# Patient Record
Sex: Male | Born: 1970 | Race: Black or African American | Hispanic: No | Marital: Married
Health system: Southern US, Academic
[De-identification: ages and names within clinical notes are randomized; demographics above are authoritative.]

## PROBLEM LIST (undated history)

## (undated) ENCOUNTER — Encounter

## (undated) ENCOUNTER — Encounter: Attending: Internal Medicine | Primary: Internal Medicine

## (undated) ENCOUNTER — Encounter: Attending: Hematology & Oncology | Primary: Hematology & Oncology

## (undated) ENCOUNTER — Ambulatory Visit: Payer: MEDICARE | Attending: Nurse Practitioner | Primary: Nurse Practitioner

## (undated) ENCOUNTER — Ambulatory Visit

## (undated) ENCOUNTER — Telehealth: Attending: Pharmacist | Primary: Pharmacist

## (undated) ENCOUNTER — Telehealth

## (undated) ENCOUNTER — Encounter: Attending: Nurse Practitioner | Primary: Nurse Practitioner

## (undated) ENCOUNTER — Encounter: Attending: Pharmacist | Primary: Pharmacist

## (undated) ENCOUNTER — Ambulatory Visit: Payer: MEDICARE

## (undated) ENCOUNTER — Telehealth
Attending: Student in an Organized Health Care Education/Training Program | Primary: Student in an Organized Health Care Education/Training Program

## (undated) ENCOUNTER — Ambulatory Visit: Payer: MEDICARE | Attending: Pediatric Cardiology | Primary: Pediatric Cardiology

## (undated) ENCOUNTER — Telehealth: Attending: Hematology & Oncology | Primary: Hematology & Oncology

## (undated) ENCOUNTER — Telehealth: Attending: Nurse Practitioner | Primary: Nurse Practitioner

## (undated) ENCOUNTER — Ambulatory Visit: Payer: MEDICARE | Attending: Clinical Cardiac Electrophysiology | Primary: Clinical Cardiac Electrophysiology

## (undated) ENCOUNTER — Encounter: Attending: Cardiovascular Disease | Primary: Cardiovascular Disease

## (undated) ENCOUNTER — Encounter
Attending: Student in an Organized Health Care Education/Training Program | Primary: Student in an Organized Health Care Education/Training Program

## (undated) ENCOUNTER — Telehealth: Attending: Internal Medicine | Primary: Internal Medicine

## (undated) ENCOUNTER — Encounter: Payer: MEDICARE | Attending: Adult Health | Primary: Adult Health

## (undated) ENCOUNTER — Telehealth: Attending: Adult Health | Primary: Adult Health

## (undated) ENCOUNTER — Encounter: Attending: Adult Health | Primary: Adult Health

## (undated) ENCOUNTER — Ambulatory Visit
Payer: MEDICARE | Attending: Student in an Organized Health Care Education/Training Program | Primary: Student in an Organized Health Care Education/Training Program

## (undated) ENCOUNTER — Encounter: Attending: Clinical Cardiac Electrophysiology | Primary: Clinical Cardiac Electrophysiology

## (undated) ENCOUNTER — Ambulatory Visit: Payer: MEDICARE | Attending: Adult Health | Primary: Adult Health

## (undated) ENCOUNTER — Ambulatory Visit
Payer: Medicare (Managed Care) | Attending: Student in an Organized Health Care Education/Training Program | Primary: Student in an Organized Health Care Education/Training Program

## (undated) ENCOUNTER — Inpatient Hospital Stay

## (undated) ENCOUNTER — Encounter: Payer: MEDICARE | Attending: Clinical Cardiac Electrophysiology | Primary: Clinical Cardiac Electrophysiology

## (undated) ENCOUNTER — Ambulatory Visit: Payer: MEDICARE | Attending: Hematology & Oncology | Primary: Hematology & Oncology

## (undated) ENCOUNTER — Ambulatory Visit: Payer: Medicare (Managed Care)

---

## 2019-07-29 DIAGNOSIS — C9210 Chronic myeloid leukemia, BCR/ABL-positive, not having achieved remission: Principal | ICD-10-CM

## 2019-08-11 DIAGNOSIS — C9210 Chronic myeloid leukemia, BCR/ABL-positive, not having achieved remission: Principal | ICD-10-CM

## 2019-08-12 ENCOUNTER — Encounter: Admit: 2019-08-12 | Discharge: 2019-08-12 | Payer: MEDICARE | Attending: Internal Medicine | Primary: Internal Medicine

## 2019-08-12 ENCOUNTER — Other Ambulatory Visit: Admit: 2019-08-12 | Discharge: 2019-08-12 | Payer: MEDICARE

## 2019-08-12 DIAGNOSIS — Q221 Congenital pulmonary valve stenosis: Principal | ICD-10-CM

## 2019-08-12 DIAGNOSIS — E1165 Type 2 diabetes mellitus with hyperglycemia: Secondary | ICD-10-CM

## 2019-08-12 DIAGNOSIS — C9210 Chronic myeloid leukemia, BCR/ABL-positive, not having achieved remission: Principal | ICD-10-CM

## 2019-08-12 DIAGNOSIS — Z794 Long term (current) use of insulin: Principal | ICD-10-CM

## 2019-08-12 MED ORDER — HYDROXYUREA 500 MG CAPSULE
ORAL_CAPSULE | Freq: Two times a day (BID) | ORAL | 0 refills | 30.00000 days | Status: CP
Start: 2019-08-12 — End: 2019-09-11

## 2019-08-19 DIAGNOSIS — C9210 Chronic myeloid leukemia, BCR/ABL-positive, not having achieved remission: Principal | ICD-10-CM

## 2019-08-20 ENCOUNTER — Encounter: Admit: 2019-08-20 | Discharge: 2019-08-21 | Payer: MEDICARE

## 2019-08-20 ENCOUNTER — Ambulatory Visit: Admit: 2019-08-20 | Discharge: 2019-08-21 | Payer: MEDICARE

## 2019-08-20 DIAGNOSIS — C9210 Chronic myeloid leukemia, BCR/ABL-positive, not having achieved remission: Principal | ICD-10-CM

## 2019-08-26 ENCOUNTER — Encounter: Admit: 2019-08-26 | Discharge: 2019-08-26 | Disposition: A | Discharge status: Home / Self Care | Payer: MEDICARE

## 2019-08-26 DIAGNOSIS — C9210 Chronic myeloid leukemia, BCR/ABL-positive, not having achieved remission: Principal | ICD-10-CM

## 2019-08-26 DIAGNOSIS — Z794 Long term (current) use of insulin: Secondary | ICD-10-CM

## 2019-08-26 DIAGNOSIS — R55 Syncope and collapse: Principal | ICD-10-CM

## 2019-08-26 DIAGNOSIS — E119 Type 2 diabetes mellitus without complications: Secondary | ICD-10-CM

## 2019-08-26 DIAGNOSIS — N529 Male erectile dysfunction, unspecified: Principal | ICD-10-CM

## 2019-08-26 MED ORDER — BOSUTINIB 100 MG TABLET
ORAL_TABLET | Freq: Every day | ORAL | 6 refills | 30 days | Status: CP
Start: 2019-08-26 — End: 2019-09-25

## 2019-08-26 MED ORDER — HYDROXYUREA 500 MG CAPSULE
ORAL_CAPSULE | Freq: Two times a day (BID) | ORAL | 0 refills | 30.00000 days | Status: CP
Start: 2019-08-26 — End: 2019-09-25

## 2019-08-30 DIAGNOSIS — C9210 Chronic myeloid leukemia, BCR/ABL-positive, not having achieved remission: Principal | ICD-10-CM

## 2019-08-30 MED ORDER — BOSUTINIB 100 MG TABLET
ORAL_TABLET | Freq: Every day | ORAL | 6 refills | 30 days | Status: CP
Start: 2019-08-30 — End: 2019-09-29

## 2019-09-05 DIAGNOSIS — C9210 Chronic myeloid leukemia, BCR/ABL-positive, not having achieved remission: Principal | ICD-10-CM

## 2019-09-06 DIAGNOSIS — C9210 Chronic myeloid leukemia, BCR/ABL-positive, not having achieved remission: Principal | ICD-10-CM

## 2019-09-11 ENCOUNTER — Ambulatory Visit: Admit: 2019-09-11 | Payer: MEDICARE | Attending: Pharmacist | Primary: Pharmacist

## 2019-09-11 DIAGNOSIS — Q221 Congenital pulmonary valve stenosis: Principal | ICD-10-CM

## 2019-09-11 DIAGNOSIS — Q211 Atrial septal defect: Principal | ICD-10-CM

## 2019-09-16 ENCOUNTER — Encounter: Admit: 2019-09-16 | Discharge: 2019-09-16 | Payer: MEDICARE

## 2019-09-16 ENCOUNTER — Encounter: Admit: 2019-09-16 | Discharge: 2019-09-16 | Payer: MEDICARE | Attending: Internal Medicine | Primary: Internal Medicine

## 2019-09-16 ENCOUNTER — Encounter: Admit: 2019-09-16 | Discharge: 2019-09-16 | Payer: MEDICARE | Attending: Pharmacist | Primary: Pharmacist

## 2019-09-16 DIAGNOSIS — C9210 Chronic myeloid leukemia, BCR/ABL-positive, not having achieved remission: Principal | ICD-10-CM

## 2019-09-16 MED ORDER — PROCHLORPERAZINE MALEATE 10 MG TABLET
ORAL_TABLET | Freq: Four times a day (QID) | ORAL | 6 refills | 15.00000 days | Status: CP | PRN
Start: 2019-09-16 — End: 2019-10-16

## 2019-09-16 MED ORDER — BOSUTINIB 100 MG TABLET
ORAL_TABLET | Freq: Every day | ORAL | 6 refills | 30 days | Status: CP
Start: 2019-09-16 — End: 2019-10-16

## 2019-09-16 MED ORDER — LOPERAMIDE 2 MG TABLET
ORAL_TABLET | Freq: Four times a day (QID) | ORAL | 6 refills | 30 days | Status: CP | PRN
Start: 2019-09-16 — End: 2019-10-16

## 2019-09-20 ENCOUNTER — Ambulatory Visit: Admit: 2019-09-20 | Discharge: 2019-09-21 | Payer: MEDICARE

## 2019-09-20 ENCOUNTER — Ambulatory Visit: Admit: 2019-09-20 | Discharge: 2019-09-20 | Payer: MEDICARE

## 2019-09-20 DIAGNOSIS — Q221 Congenital pulmonary valve stenosis: Principal | ICD-10-CM

## 2019-10-07 ENCOUNTER — Ambulatory Visit: Admit: 2019-10-07 | Payer: MEDICARE | Attending: Pharmacist | Primary: Pharmacist

## 2019-10-07 ENCOUNTER — Ambulatory Visit: Admit: 2019-10-07 | Payer: MEDICARE

## 2019-10-23 ENCOUNTER — Ambulatory Visit: Admit: 2019-10-23 | Payer: MEDICARE

## 2019-10-28 ENCOUNTER — Ambulatory Visit: Admit: 2019-10-28 | Payer: MEDICARE | Attending: Adult Health | Primary: Adult Health

## 2019-11-05 ENCOUNTER — Encounter
Admit: 2019-11-05 | Discharge: 2019-11-06 | Payer: MEDICARE | Attending: Clinical Cardiac Electrophysiology | Primary: Clinical Cardiac Electrophysiology

## 2019-11-05 DIAGNOSIS — I483 Typical atrial flutter: Principal | ICD-10-CM

## 2019-11-07 DIAGNOSIS — I4891 Unspecified atrial fibrillation: Principal | ICD-10-CM

## 2019-11-18 ENCOUNTER — Encounter: Admit: 2019-11-18 | Discharge: 2019-11-19 | Payer: MEDICARE | Attending: Adult Health | Primary: Adult Health

## 2019-11-18 ENCOUNTER — Encounter: Admit: 2019-11-18 | Discharge: 2019-11-19 | Payer: MEDICARE

## 2019-11-18 DIAGNOSIS — C9210 Chronic myeloid leukemia, BCR/ABL-positive, not having achieved remission: Principal | ICD-10-CM

## 2019-11-18 MED ORDER — BOSUTINIB 100 MG TABLET
ORAL_TABLET | Freq: Every day | ORAL | 6 refills | 30 days | Status: CP
Start: 2019-11-18 — End: ?

## 2019-11-26 ENCOUNTER — Encounter: Admit: 2019-11-26 | Discharge: 2019-11-26 | Payer: MEDICARE

## 2019-11-28 ENCOUNTER — Encounter: Admit: 2019-11-28 | Discharge: 2019-11-28 | Payer: MEDICARE

## 2019-11-29 ENCOUNTER — Encounter: Admit: 2019-11-29 | Payer: MEDICARE

## 2019-12-23 ENCOUNTER — Ambulatory Visit: Admit: 2019-12-23 | Discharge: 2019-12-23 | Payer: MEDICARE

## 2019-12-23 DIAGNOSIS — I4891 Unspecified atrial fibrillation: Principal | ICD-10-CM

## 2019-12-23 MED ORDER — RIVAROXABAN 15 MG TABLET
ORAL_TABLET | Freq: Every day | ORAL | 6 refills | 30.00000 days | Status: CP
Start: 2019-12-23 — End: 2020-01-22

## 2019-12-27 ENCOUNTER — Encounter
Admit: 2019-12-27 | Discharge: 2019-12-27 | Payer: MEDICARE | Attending: Certified Registered" | Primary: Certified Registered"

## 2019-12-27 ENCOUNTER — Encounter: Admit: 2019-12-27 | Discharge: 2019-12-27 | Payer: MEDICARE

## 2019-12-27 DIAGNOSIS — I4891 Unspecified atrial fibrillation: Principal | ICD-10-CM

## 2020-01-06 ENCOUNTER — Ambulatory Visit: Admit: 2020-01-06 | Payer: MEDICARE | Attending: Adult Health | Primary: Adult Health

## 2020-01-06 ENCOUNTER — Ambulatory Visit: Admit: 2020-01-06 | Payer: MEDICARE

## 2020-01-06 DIAGNOSIS — Q211 Atrial septal defect: Principal | ICD-10-CM

## 2020-01-06 DIAGNOSIS — Q221 Congenital pulmonary valve stenosis: Principal | ICD-10-CM

## 2020-02-10 ENCOUNTER — Ambulatory Visit: Admit: 2020-02-10 | Payer: MEDICARE

## 2020-02-10 ENCOUNTER — Ambulatory Visit: Admit: 2020-02-10 | Payer: MEDICARE | Attending: Adult Health | Primary: Adult Health

## 2020-02-11 ENCOUNTER — Encounter
Admit: 2020-02-11 | Discharge: 2020-02-11 | Disposition: A | Discharge status: Home / Self Care | Payer: MEDICARE | Attending: Emergency Medicine

## 2020-02-11 DIAGNOSIS — E1122 Type 2 diabetes mellitus with diabetic chronic kidney disease: Principal | ICD-10-CM

## 2020-02-11 DIAGNOSIS — Z79899 Other long term (current) drug therapy: Principal | ICD-10-CM

## 2020-02-11 DIAGNOSIS — R200 Anesthesia of skin: Principal | ICD-10-CM

## 2020-02-11 DIAGNOSIS — C9210 Chronic myeloid leukemia, BCR/ABL-positive, not having achieved remission: Principal | ICD-10-CM

## 2020-02-11 DIAGNOSIS — Z794 Long term (current) use of insulin: Principal | ICD-10-CM

## 2020-02-11 DIAGNOSIS — I480 Paroxysmal atrial fibrillation: Principal | ICD-10-CM

## 2020-02-11 DIAGNOSIS — I130 Hypertensive heart and chronic kidney disease with heart failure and stage 1 through stage 4 chronic kidney disease, or unspecified chronic kidney disease: Principal | ICD-10-CM

## 2020-02-11 DIAGNOSIS — R531 Weakness: Principal | ICD-10-CM

## 2020-02-11 DIAGNOSIS — Z7901 Long term (current) use of anticoagulants: Principal | ICD-10-CM

## 2020-02-11 DIAGNOSIS — Z87891 Personal history of nicotine dependence: Principal | ICD-10-CM

## 2020-02-11 DIAGNOSIS — Z86718 Personal history of other venous thrombosis and embolism: Principal | ICD-10-CM

## 2020-02-11 DIAGNOSIS — R29818 Other symptoms and signs involving the nervous system: Principal | ICD-10-CM

## 2020-02-11 DIAGNOSIS — I4510 Unspecified right bundle-branch block: Principal | ICD-10-CM

## 2020-02-11 DIAGNOSIS — N183 Chronic kidney disease, stage 3 unspecified: Principal | ICD-10-CM

## 2020-02-27 DIAGNOSIS — C9210 Chronic myeloid leukemia, BCR/ABL-positive, not having achieved remission: Principal | ICD-10-CM

## 2020-05-04 ENCOUNTER — Encounter
Admit: 2020-05-04 | Discharge: 2020-05-05 | Disposition: A | Discharge status: Home / Self Care | Payer: MEDICARE | Attending: Emergency Medicine

## 2020-05-04 ENCOUNTER — Emergency Department
Admit: 2020-05-04 | Discharge: 2020-05-05 | Disposition: A | Discharge status: Home / Self Care | Payer: MEDICARE | Attending: Emergency Medicine

## 2020-05-04 DIAGNOSIS — I616 Nontraumatic intracerebral hemorrhage, multiple localized: Principal | ICD-10-CM

## 2020-05-04 DIAGNOSIS — E785 Hyperlipidemia, unspecified: Principal | ICD-10-CM

## 2020-05-04 DIAGNOSIS — Z87891 Personal history of nicotine dependence: Principal | ICD-10-CM

## 2020-05-04 DIAGNOSIS — R202 Paresthesia of skin: Principal | ICD-10-CM

## 2020-05-04 DIAGNOSIS — I513 Intracardiac thrombosis, not elsewhere classified: Principal | ICD-10-CM

## 2020-05-04 DIAGNOSIS — I491 Atrial premature depolarization: Principal | ICD-10-CM

## 2020-05-04 DIAGNOSIS — R4789 Other speech disturbances: Principal | ICD-10-CM

## 2020-05-04 DIAGNOSIS — Z7901 Long term (current) use of anticoagulants: Principal | ICD-10-CM

## 2020-05-04 DIAGNOSIS — R9431 Abnormal electrocardiogram [ECG] [EKG]: Principal | ICD-10-CM

## 2020-05-04 DIAGNOSIS — I5022 Chronic systolic (congestive) heart failure: Principal | ICD-10-CM

## 2020-05-04 DIAGNOSIS — Z9114 Patient's other noncompliance with medication regimen: Principal | ICD-10-CM

## 2020-05-04 DIAGNOSIS — I480 Paroxysmal atrial fibrillation: Principal | ICD-10-CM

## 2020-05-04 DIAGNOSIS — R200 Anesthesia of skin: Principal | ICD-10-CM

## 2020-05-04 DIAGNOSIS — I4510 Unspecified right bundle-branch block: Principal | ICD-10-CM

## 2020-05-04 DIAGNOSIS — R410 Disorientation, unspecified: Principal | ICD-10-CM

## 2020-05-04 DIAGNOSIS — R42 Dizziness and giddiness: Principal | ICD-10-CM

## 2020-05-04 DIAGNOSIS — E1165 Type 2 diabetes mellitus with hyperglycemia: Principal | ICD-10-CM

## 2020-05-04 DIAGNOSIS — Q211 Atrial septal defect: Principal | ICD-10-CM

## 2020-05-04 DIAGNOSIS — N183 Chronic kidney disease, stage 3 unspecified: Principal | ICD-10-CM

## 2020-05-04 DIAGNOSIS — R55 Syncope and collapse: Principal | ICD-10-CM

## 2020-05-04 DIAGNOSIS — R531 Weakness: Principal | ICD-10-CM

## 2020-05-04 DIAGNOSIS — Z794 Long term (current) use of insulin: Principal | ICD-10-CM

## 2020-05-04 DIAGNOSIS — R739 Hyperglycemia, unspecified: Principal | ICD-10-CM

## 2020-05-04 DIAGNOSIS — Z79899 Other long term (current) drug therapy: Principal | ICD-10-CM

## 2020-05-04 DIAGNOSIS — E1122 Type 2 diabetes mellitus with diabetic chronic kidney disease: Principal | ICD-10-CM

## 2020-05-04 DIAGNOSIS — C9210 Chronic myeloid leukemia, BCR/ABL-positive, not having achieved remission: Principal | ICD-10-CM

## 2020-05-04 DIAGNOSIS — I130 Hypertensive heart and chronic kidney disease with heart failure and stage 1 through stage 4 chronic kidney disease, or unspecified chronic kidney disease: Principal | ICD-10-CM

## 2020-06-01 ENCOUNTER — Ambulatory Visit: Admit: 2020-06-01 | Payer: MEDICARE | Attending: Family | Primary: Family

## 2020-06-09 DIAGNOSIS — C9210 Chronic myeloid leukemia, BCR/ABL-positive, not having achieved remission: Principal | ICD-10-CM

## 2020-06-10 ENCOUNTER — Other Ambulatory Visit
Admit: 2020-06-10 | Discharge: 2020-06-13 | Disposition: A | Discharge status: Home / Self Care | Payer: MEDICARE | Admitting: Student in an Organized Health Care Education/Training Program

## 2020-06-10 ENCOUNTER — Ambulatory Visit
Admit: 2020-06-10 | Discharge: 2020-06-13 | Disposition: A | Discharge status: Home / Self Care | Payer: MEDICARE | Admitting: Student in an Organized Health Care Education/Training Program

## 2020-06-10 ENCOUNTER — Ambulatory Visit
Admit: 2020-06-10 | Discharge: 2020-06-13 | Disposition: A | Discharge status: Home / Self Care | Payer: MEDICARE | Attending: Adult Health | Admitting: Student in an Organized Health Care Education/Training Program | Primary: Adult Health

## 2020-06-10 DIAGNOSIS — C9210 Chronic myeloid leukemia, BCR/ABL-positive, not having achieved remission: Principal | ICD-10-CM

## 2020-06-12 MED ORDER — INSULIN LISPRO (U-100) 100 UNIT/ML SUBCUTANEOUS SOLUTION
Freq: Three times a day (TID) | SUBCUTANEOUS | 0 refills | 34 days | Status: CP
Start: 2020-06-12 — End: 2020-07-12

## 2020-06-12 MED ORDER — LANTUS SOLOSTAR U-100 INSULIN 100 UNIT/ML (3 ML) SUBCUTANEOUS PEN
Freq: Every evening | SUBCUTANEOUS | 0 refills | 46 days | Status: CP
Start: 2020-06-12 — End: 2020-07-12

## 2020-06-12 MED ORDER — TRULICITY 0.75 MG/0.5 ML SUBCUTANEOUS PEN INJECTOR
SUBCUTANEOUS | 0 refills | 28 days | Status: CP
Start: 2020-06-12 — End: 2020-07-12

## 2020-06-13 MED ORDER — INSULIN ASPART U-100  100 UNIT/ML SUBCUTANEOUS SOLUTION
Freq: Three times a day (TID) | SUBCUTANEOUS | 0 refills | 30 days | Status: CP
Start: 2020-06-13 — End: 2020-06-13

## 2020-06-13 MED ORDER — INSULIN ASPART (U-100) 100 UNIT/ML (3 ML) SUBCUTANEOUS PEN
Freq: Three times a day (TID) | SUBCUTANEOUS | 0 refills | 30.00000 days | Status: CP
Start: 2020-06-13 — End: 2020-07-13

## 2020-06-13 MED ORDER — INSULIN GLARGINE (U-100) 100 UNIT/ML (3 ML) SUBCUTANEOUS PEN
Freq: Every evening | SUBCUTANEOUS | 0 days
Start: 2020-06-13 — End: 2020-07-13

## 2020-06-13 MED ORDER — PEN NEEDLE, DIABETIC 29 GAUGE X 1/2" (12 MM)
0 refills | 0 days | Status: CP
Start: 2020-06-13 — End: 2021-06-13

## 2020-06-13 MED ORDER — INSULIN ASPAR PRT-INSULIN ASPART 100 UNIT/ML (70-30) SUBCUTANEOUS SOLN
Freq: Two times a day (BID) | SUBCUTANEOUS | 0 refills | 30 days | Status: CP
Start: 2020-06-13 — End: 2020-06-13

## 2020-07-03 ENCOUNTER — Ambulatory Visit: Admit: 2020-07-03 | Payer: MEDICARE

## 2020-07-21 ENCOUNTER — Ambulatory Visit
Admit: 2020-07-21 | Payer: MEDICARE | Attending: Clinical Cardiac Electrophysiology | Primary: Clinical Cardiac Electrophysiology

## 2020-07-21 DIAGNOSIS — C9210 Chronic myeloid leukemia, BCR/ABL-positive, not having achieved remission: Principal | ICD-10-CM

## 2020-07-22 ENCOUNTER — Ambulatory Visit: Admit: 2020-07-22 | Payer: MEDICARE | Attending: Internal Medicine | Primary: Internal Medicine

## 2020-07-23 DIAGNOSIS — C9210 Chronic myeloid leukemia, BCR/ABL-positive, not having achieved remission: Principal | ICD-10-CM

## 2020-08-19 ENCOUNTER — Ambulatory Visit: Admit: 2020-08-19 | Payer: MEDICARE

## 2020-08-19 ENCOUNTER — Ambulatory Visit: Admit: 2020-08-19 | Payer: MEDICARE | Attending: Adult Health | Primary: Adult Health

## 2020-09-14 ENCOUNTER — Ambulatory Visit: Admit: 2020-09-14 | Payer: MEDICARE | Attending: Adult Health | Primary: Adult Health

## 2020-09-22 ENCOUNTER — Ambulatory Visit
Admit: 2020-09-22 | Payer: MEDICARE | Attending: Clinical Cardiac Electrophysiology | Primary: Clinical Cardiac Electrophysiology

## 2020-09-22 ENCOUNTER — Ambulatory Visit: Admit: 2020-09-22 | Payer: MEDICARE | Attending: "Endocrinology | Primary: "Endocrinology

## 2020-09-23 ENCOUNTER — Ambulatory Visit: Admit: 2020-09-23 | Payer: MEDICARE

## 2020-10-05 DIAGNOSIS — R55 Syncope and collapse: Principal | ICD-10-CM

## 2020-11-11 ENCOUNTER — Ambulatory Visit: Admit: 2020-11-11 | Discharge: 2020-11-12 | Payer: MEDICARE

## 2020-11-11 ENCOUNTER — Encounter: Admit: 2020-11-11 | Discharge: 2020-11-12 | Payer: MEDICARE

## 2020-11-11 ENCOUNTER — Encounter: Admit: 2020-11-11 | Discharge: 2020-11-12 | Payer: MEDICARE | Attending: Adult Health | Primary: Adult Health

## 2020-11-11 DIAGNOSIS — C9210 Chronic myeloid leukemia, BCR/ABL-positive, not having achieved remission: Principal | ICD-10-CM

## 2020-11-11 MED ORDER — FUROSEMIDE 20 MG TABLET
ORAL_TABLET | Freq: Every day | ORAL | 0 refills | 4.00000 days | Status: CP
Start: 2020-11-11 — End: ?

## 2020-11-11 MED ORDER — BOSUTINIB 100 MG TABLET
ORAL_TABLET | Freq: Every day | ORAL | 6 refills | 30 days | Status: CP
Start: 2020-11-11 — End: ?

## 2020-11-12 MED ORDER — INSULIN ASPART (U-100) 100 UNIT/ML (3 ML) SUBCUTANEOUS PEN
Freq: Three times a day (TID) | SUBCUTANEOUS | 0 refills | 30 days
Start: 2020-11-12 — End: 2020-12-12

## 2020-11-12 MED ORDER — TORSEMIDE 20 MG TABLET
ORAL_TABLET | Freq: Every day | ORAL | 1 refills | 30 days | Status: CP
Start: 2020-11-12 — End: 2020-12-12

## 2020-11-12 MED ORDER — INSULIN GLARGINE (U-100) 100 UNIT/ML (3 ML) SUBCUTANEOUS PEN
Freq: Every evening | SUBCUTANEOUS | 12 refills | 46.00000 days
Start: 2020-11-12 — End: 2020-12-12

## 2020-11-19 ENCOUNTER — Encounter: Admit: 2020-11-19 | Payer: MEDICARE

## 2020-11-19 DIAGNOSIS — C9210 Chronic myeloid leukemia, BCR/ABL-positive, not having achieved remission: Principal | ICD-10-CM

## 2020-11-24 ENCOUNTER — Ambulatory Visit
Admit: 2020-11-24 | Discharge: 2020-12-04 | Disposition: A | Discharge status: Home / Self Care | Payer: MEDICARE | Source: Ambulatory Visit

## 2020-11-24 ENCOUNTER — Ambulatory Visit: Admit: 2020-11-24 | Payer: MEDICARE

## 2020-11-24 ENCOUNTER — Ambulatory Visit: Admit: 2020-11-24 | Discharge: 2020-11-25 | Payer: MEDICARE | Attending: Pharmacist | Primary: Pharmacist

## 2020-11-24 DIAGNOSIS — R55 Syncope and collapse: Principal | ICD-10-CM

## 2020-11-25 NOTE — Unmapped (Addendum)
[ ]  Consider adding back home Losartan depending on kidney funciton  [ ]  Consider going back to home Metoprolol dose of 200mg  daily if necessary  [ ]  Outpatient workup of pulmonic valve repair (transvalvular vs CT surgery)  [ ]  PCP to follow up diabetes management     Colin Hunter is a 50 y.o. 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 His hospital course is outlined by problem below.    Recurrent Syncopal and Pre-Syncopal Episodes: Patient with several hospitalizations for similar chief complaint ([facility], [facility] and [facility]). So far with negative workup including CT/MRI head imaging, Carotid dopplers, negative orthostatic vital signs, neurology consult with EEG which did not show evidence of seizures. Etiology of these syncopal events unclear. However, highest on the differential at this time given other negative work up to date includes arythmia induced (patient with known history of Afib/Flutter). Additional consideration is a functional low-output state in the setting of his heart failure leading to poor profusion. As he presented with volume overload, we aggressively diuresed him to optimize his volume status. Right heart cath revealed RA  mean 13 mm Hg, RV 53/3, 13, PA 48/5 mean 22, PCWP mean 13. He then underwent successful ablation with conversion to sinus rhythm on 8/25. Definitive management of pulmonic insufficiency will be done in the outpatient setting (transvalvular vs CT surgery).    HFrEF (EF 40% with RV Systolic Dysfunction) - Volume Overload: History of congenital pulmonic stenosis. Echo in 11/2020 with EF 40%, Moderate RV systolic dysfunction. GDMT at Home: Torsemide 20mg  BID, Metoprolol Succinate 200mg  daily, Losartan 25mg  daily. Endorses ~40lb weight gain (dry weight reportedly 244lbs, 282lbs on admission), increasing LE edema prior to admission. BNP 118. JVP elevated on exam, with non-pitting edema in LE bilaterally. He was aggressively diuresed with lasix 80mg  IV BID until euvolemic. On discharge we transitioned back to oral Torsemide 20mg  BID, and have held losartan given AKI. Right heart cath revealed RA  mean 13 mm Hg, RV 53/3, 13, PA 48/5 mean 22, PCWP mean 13. Definitive management of pulmonic insufficiency will be done in the outpatient setting (transvalvular vs CT surgery).    Atrial Fibrillation/Flutter: EKG on admission with A Flutter 4:1 but rate controlled. On Eliquis and Metoprolol at home. Resumed beta blockade with metoprolol at half home dose. History of DCCV in 12/2019 with unclear length of time he remained in NSR. Plan was for ablation as an outpatient but was successfully ablated into NSR on 8/25. Will discharge on metoprolol 100mg  daily with outpatient follow up to determine increasing back to 200mg  if necessary. Continue home eliquis 5mg  BID.     AKI - NAGMA  Cr 2.17 most recently. On admission elevated to 3.70 -> 2.68. Suspect likely 2/2 Cardiorenal. Bicarb 18 on admission with normal pH on VBG, which could be in setting of AKI vs renal tubular acidosis given normal anion gap. Lactate normal. 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 Diuresed as above which was complicated in setting of AKI. Cr Improved to 2.68 on discharge.     HTN: Home meds unclear, patient not always taking consistently and did not know what he was taking at home.  Hold hydralazine and Imdur since unclear if patient taking at home. Continue home Amlodipine and metoprolol     CML - Iron Deficiency Anemia: Ferritin 67.3, Iron 33, and Iron Sat 6% suggesting iron deficiency anemia, repleated with IV iron inpatient. Also with history of CML. Previously on Bosutinib but feels this has worsened his volume overload. Heme/onc consulted, appreciated recs. Discussed Bosutinib with Heme/Onc, recommend holding it while inpatient and on discharge. Heme onc plans to transition to Asciminib 40mg  bid as outpatient.     T2DM - ASCVD Risk: Uncontrolled, A1c >14. Home meds include SSI with meals, Lantus 32 units nightly, Ozempic 0.25mg  weekly which we continued on discharge. Continue Crestor (subbed atorvastatin while inpatient).

## 2020-11-28 DIAGNOSIS — C9210 Chronic myeloid leukemia, BCR/ABL-positive, not having achieved remission: Principal | ICD-10-CM

## 2020-11-28 MED ORDER — ASCIMINIB 40 MG TABLET
ORAL_TABLET | Freq: Two times a day (BID) | ORAL | 11 refills | 30 days | Status: CP
Start: 2020-11-28 — End: 2020-12-28

## 2020-11-30 LAB — CBC
HEMATOCRIT: 35.8 % — ABNORMAL LOW (ref 39.0–48.0)
HEMOGLOBIN: 11.8 g/dL — ABNORMAL LOW (ref 12.9–16.5)
MEAN CORPUSCULAR HEMOGLOBIN CONC: 32.9 g/dL (ref 32.0–36.0)
MEAN CORPUSCULAR HEMOGLOBIN: 27.5 pg (ref 25.9–32.4)
MEAN CORPUSCULAR VOLUME: 83.5 fL (ref 77.6–95.7)
MEAN PLATELET VOLUME: 10.2 fL (ref 6.8–10.7)
PLATELET COUNT: 160 10*9/L (ref 150–450)
RED BLOOD CELL COUNT: 4.29 10*12/L (ref 4.26–5.60)
RED CELL DISTRIBUTION WIDTH: 16.7 % — ABNORMAL HIGH (ref 12.2–15.2)
WBC ADJUSTED: 9 10*9/L (ref 3.6–11.2)

## 2020-11-30 LAB — BASIC METABOLIC PANEL
ANION GAP: 7 mmol/L (ref 5–14)
ANION GAP: 7 mmol/L (ref 5–14)
BLOOD UREA NITROGEN: 63 mg/dL — ABNORMAL HIGH (ref 9–23)
BLOOD UREA NITROGEN: 66 mg/dL — ABNORMAL HIGH (ref 9–23)
BUN / CREAT RATIO: 19
BUN / CREAT RATIO: 23
CALCIUM: 9 mg/dL (ref 8.7–10.4)
CALCIUM: 9.6 mg/dL (ref 8.7–10.4)
CHLORIDE: 106 mmol/L (ref 98–107)
CHLORIDE: 107 mmol/L (ref 98–107)
CO2: 23 mmol/L (ref 20.0–31.0)
CO2: 21 mmol/L (ref 20.0–31.0)
CREATININE: 3.26 mg/dL — ABNORMAL HIGH
CREATININE: 2.93 mg/dL — ABNORMAL HIGH
EGFR CKD-EPI (2021) MALE: 22 mL/min/{1.73_m2} — ABNORMAL LOW (ref >=60)
EGFR CKD-EPI (2021) MALE: 25 mL/min/{1.73_m2} — ABNORMAL LOW (ref >=60)
GLUCOSE RANDOM: 169 mg/dL (ref 70–179)
GLUCOSE RANDOM: 333 mg/dL — ABNORMAL HIGH (ref 70–179)
POTASSIUM: 4 mmol/L (ref 3.4–4.8)
POTASSIUM: 4.6 mmol/L (ref 3.4–4.8)
SODIUM: 136 mmol/L (ref 135–145)
SODIUM: 135 mmol/L (ref 135–145)

## 2020-11-30 LAB — MAGNESIUM
MAGNESIUM: 1.8 mg/dL (ref 1.6–2.6)
MAGNESIUM: 2.1 mg/dL (ref 1.6–2.6)

## 2020-11-30 MED ADMIN — heparin (porcine) in NS Manifold Flush: @ 13:00:00 | Stop: 2020-11-30

## 2020-11-30 MED ADMIN — magnesium oxide (MAG-OX) tablet 800 mg: 800 mg | ORAL | @ 16:00:00 | Stop: 2020-11-30

## 2020-11-30 MED ADMIN — insulin lispro (HumaLOG) injection 0-20 Units: 0-20 [IU] | SUBCUTANEOUS | @ 22:00:00

## 2020-11-30 MED ADMIN — pantoprazole (PROTONIX) EC tablet 40 mg: 40 mg | ORAL | @ 15:00:00

## 2020-11-30 MED ADMIN — insulin lispro (HumaLOG) injection 0-20 Units: 0-20 [IU] | SUBCUTANEOUS | @ 19:00:00

## 2020-11-30 MED ADMIN — pregabalin (LYRICA) capsule 75 mg: 75 mg | ORAL | @ 15:00:00

## 2020-11-30 MED ADMIN — acetaminophen (TYLENOL) tablet 650 mg: 650 mg | ORAL | @ 15:00:00

## 2020-11-30 MED ADMIN — insulin lispro (HumaLOG) injection 4 Units: 4 [IU] | SUBCUTANEOUS | @ 22:00:00

## 2020-11-30 MED ADMIN — metoprolol succinate (TOPROL-XL) 24 hr tablet 100 mg: 100 mg | ORAL | @ 15:00:00

## 2020-11-30 MED ADMIN — insulin lispro (HumaLOG) injection 4 Units: 4 [IU] | SUBCUTANEOUS | @ 19:00:00

## 2020-11-30 MED ADMIN — amLODIPine (NORVASC) tablet 10 mg: 10 mg | ORAL | @ 15:00:00

## 2020-11-30 MED ADMIN — lidocaine (PF) (XYLOCAINE-MPF) 20 mg/mL (2 %) injection: SUBCUTANEOUS | @ 13:00:00 | Stop: 2020-11-30

## 2020-11-30 NOTE — Unmapped (Signed)
Pt. Vss, pt. C/o of no pain this shift. Pt. On continuous tele monitoring. Educated pt. On syncope an the importance not to get up if feeling dizzy, pt. Verbalizes understanding. Pt. Able to make needs known. NPO since midnight. Blood sugar monitoring in place. Bed in lowest position an call light with reach. Pt. Remains free from falls an injury this shift.    Problem: Adult Inpatient Plan of Care  Goal: Plan of Care Review  Outcome: Progressing  Goal: Patient-Specific Goal (Individualized)  Outcome: Progressing  Goal: Absence of Hospital-Acquired Illness or Injury  Outcome: Progressing  Goal: Optimal Comfort and Wellbeing  Outcome: Progressing  Goal: Readiness for Transition of Care  Outcome: Progressing  Goal: Rounds/Family Conference  Outcome: Progressing     Problem: Dysrhythmia (Heart Failure)  Goal: Stable Heart Rate and Rhythm  Outcome: Progressing     Problem: Cardiac Output Decreased (Heart Failure)  Goal: Optimal Cardiac Output  Outcome: Progressing     Problem: Fall Injury Risk  Goal: Absence of Fall and Fall-Related Injury  Outcome: Progressing     Problem: Diabetes Comorbidity  Goal: Blood Glucose Level Within Targeted Range  Outcome: Progressing  Intervention: Monitor and Manage Glycemia  Recent Flowsheet Documentation  Taken 11/29/2020 2000 by Leroy Sea, RN  Glycemic Management: blood glucose monitored     Problem: Chest Pain  Goal: Resolution of Chest Pain Symptoms  Outcome: Progressing

## 2020-11-30 NOTE — Unmapped (Signed)
[facility] Specialty Medication Onboarding    Specialty Medication: Scemblix 40mg   Prior Authorization: Not Required   Financial Assistance: No - copay  <$25  Final Copay/Day Supply: $0 / 30    Insurance Restrictions: None     Notes to Pharmacist:     The triage team has completed the benefits investigation and has determined that the patient is able to fill this medication at [facility]. Please contact the patient to complete the onboarding or follow up with the prescribing physician as needed.

## 2020-11-30 NOTE — Unmapped (Signed)
Brief Operative Note  (CSN: [id number])      Date of Surgery: 11/30/2020    Pre-op Diagnosis: RV dysfunction    Post-op Diagnosis: Same    Procedure(s):  Right Heart Catheterization: 93451 (CPT??)  Note: Revisions to procedures should be made in chart - see Procedures activity.    Performing Service: Cardiology  Surgeon(s) and Role:     * Marlaine Hind, MD - Primary     * Lenis Dickinson, MD - Fellow - Diagnostic    Assistant: None    Findings:     RA  mean 13 mm Hg  RV 53/3, 13  PA 48/5 mean 22  PCWP mean 13    PA sat 62    Fick CO/CI 5.5/2.3  Thermodilution CO/CI 5.7/2.3    Anesthesia: Conscious Sedation (Nurse Admin)    Estimated Blood Loss: None    Complications: None    Specimens: None collected    Implants: * No implants in log *    Surgeon Notes: I was present and scrubbed for the entire procedure    Elpidio Anis   Date: 11/30/2020  Time: 8:56 AM

## 2020-12-01 LAB — BASIC METABOLIC PANEL
ANION GAP: 6 mmol/L (ref 5–14)
ANION GAP: 6 mmol/L (ref 5–14)
BLOOD UREA NITROGEN: 64 mg/dL — ABNORMAL HIGH (ref 9–23)
BLOOD UREA NITROGEN: 66 mg/dL — ABNORMAL HIGH (ref 9–23)
BUN / CREAT RATIO: 22
BUN / CREAT RATIO: 23
CALCIUM: 9.5 mg/dL (ref 8.7–10.4)
CALCIUM: 9.2 mg/dL (ref 8.7–10.4)
CHLORIDE: 105 mmol/L (ref 98–107)
CHLORIDE: 105 mmol/L (ref 98–107)
CO2: 20 mmol/L (ref 20.0–31.0)
CO2: 21 mmol/L (ref 20.0–31.0)
CREATININE: 2.92 mg/dL — ABNORMAL HIGH
CREATININE: 2.82 mg/dL — ABNORMAL HIGH
EGFR CKD-EPI (2021) MALE: 26 mL/min/{1.73_m2} — ABNORMAL LOW (ref >=60)
EGFR CKD-EPI (2021) MALE: 27 mL/min/{1.73_m2} — ABNORMAL LOW (ref >=60)
GLUCOSE RANDOM: 217 mg/dL — ABNORMAL HIGH (ref 70–179)
GLUCOSE RANDOM: 183 mg/dL — ABNORMAL HIGH (ref 70–179)
POTASSIUM: 4.6 mmol/L (ref 3.4–4.8)
POTASSIUM: 4.5 mmol/L (ref 3.4–4.8)
SODIUM: 131 mmol/L — ABNORMAL LOW (ref 135–145)
SODIUM: 132 mmol/L — ABNORMAL LOW (ref 135–145)

## 2020-12-01 LAB — MAGNESIUM
MAGNESIUM: 2 mg/dL (ref 1.6–2.6)
MAGNESIUM: 2 mg/dL (ref 1.6–2.6)

## 2020-12-01 LAB — CBC
HEMATOCRIT: 40.2 % (ref 39.0–48.0)
HEMOGLOBIN: 13.2 g/dL (ref 12.9–16.5)
MEAN CORPUSCULAR HEMOGLOBIN CONC: 32.8 g/dL (ref 32.0–36.0)
MEAN CORPUSCULAR HEMOGLOBIN: 27.2 pg (ref 25.9–32.4)
MEAN CORPUSCULAR VOLUME: 82.8 fL (ref 77.6–95.7)
MEAN PLATELET VOLUME: 10 fL (ref 6.8–10.7)
PLATELET COUNT: 183 10*9/L (ref 150–450)
RED BLOOD CELL COUNT: 4.86 10*12/L (ref 4.26–5.60)
RED CELL DISTRIBUTION WIDTH: 17 % — ABNORMAL HIGH (ref 12.2–15.2)
WBC ADJUSTED: 8.9 10*9/L (ref 3.6–11.2)

## 2020-12-01 MED ADMIN — pantoprazole (PROTONIX) EC tablet 40 mg: 40 mg | ORAL | @ 13:00:00

## 2020-12-01 MED ADMIN — insulin lispro (HumaLOG) injection 4 Units: 4 [IU] | SUBCUTANEOUS | @ 19:00:00

## 2020-12-01 MED ADMIN — amLODIPine (NORVASC) tablet 10 mg: 10 mg | ORAL | @ 13:00:00

## 2020-12-01 MED ADMIN — insulin lispro (HumaLOG) injection 4 Units: 4 [IU] | SUBCUTANEOUS | @ 15:00:00

## 2020-12-01 MED ADMIN — insulin lispro (HumaLOG) injection 4 Units: 4 [IU] | SUBCUTANEOUS | @ 22:00:00

## 2020-12-01 MED ADMIN — insulin lispro (HumaLOG) injection 0-20 Units: 0-20 [IU] | SUBCUTANEOUS | @ 22:00:00

## 2020-12-01 MED ADMIN — pregabalin (LYRICA) capsule 75 mg: 75 mg | ORAL | @ 04:00:00

## 2020-12-01 MED ADMIN — metoprolol succinate (TOPROL-XL) 24 hr tablet 100 mg: 100 mg | ORAL | @ 13:00:00

## 2020-12-01 MED ADMIN — atorvastatin (LIPITOR) tablet 40 mg: 40 mg | ORAL | @ 04:00:00

## 2020-12-01 MED ADMIN — pregabalin (LYRICA) capsule 75 mg: 75 mg | ORAL | @ 13:00:00

## 2020-12-01 MED ADMIN — insulin lispro (HumaLOG) injection 0-20 Units: 0-20 [IU] | SUBCUTANEOUS | @ 19:00:00

## 2020-12-01 NOTE — Unmapped (Signed)
Cardiology - Team 1 [facility]) Progress Note    Assessment & Plan:   Colin Hunter is a 50 y.o. 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    Principal Problem:    Syncope  Active Problems:    Atrial fibrillation (CMS-HCC)    Chronic myeloid leukemia (CMS-HCC)    CKD (chronic kidney disease) stage 3, GFR 30-59 ml/min (CMS-HCC)    Congenital pulmonary valve stenosis    Gastroesophageal reflux disease    Heart failure with reduced ejection fraction (CMS-HCC)    Type 2 diabetes mellitus treated with insulin (CMS-HCC)  Resolved Problems:    * No resolved hospital problems. *    Recurrent Syncopal and Pre-Syncopal Episodes:??Admitted for recurrent pre/syncopal events likely in the setting of Afib/Aflutter and HFrEF/pulmonary insufficiency as below. No further events during hospitalization. Negative workup including CT/MRI head, carotid dopplers, negative orthostatics, and no evidence of seizures on EEG. Working on optimizing his volume status. Suspect these events are most likely 2/2 to right sided HF with pulmonic insufficiency leading to volume overload superimposed on Afib/Aflutter.   - RHC 8/23: RA  mean 13 mm Hg, RV 53/3, 13, PA 48/5 mean 22, PCWP mean 13  - Given RHC findings, structural heart team and CT surg will evaluate him for possible pulmonic valve replacement   - Plan for ablation once plan for valve replacement is definitive   - Tele, fall precautions, PT/OT  - Management of HFrEF and A-fib per below  ??  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 Echo in 11/2020 with EF 40%, Moderate RV systolic dysfunction. GDMT at Home: Torsemide 20mg  BID, Metoprolol Succinate 200mg  daily, Losartan 25mg  daily. Endorses ~40lb weight gain (dry weight reportedly 244lbs, 282lbs on admission), increasing LE edema prior to admission. BNP 118. JVP elevated on admission, with non-pitting edema in LE bilaterally.??Diuresis initially well tolerated but has been complicated with AKI on CKD. Currently euvolemic. RHC 8/23: RA  mean 13 mm Hg, RV 53/3, 13, PA 48/5 mean 22, PCWP mean 13  - Diuresis plan: cont to hold diuresis given euvolemic exam and improvement in Cr  - BMP q12   - Home metoprolol at half dose succinate 100mg  daily  - Hold home Losartan 25 mg daily given AKI and hyperkalemia  - Hold home Toresemide   - Based on RHC results as above, will have structural evaluate him for possible pulmonic valve replacement     Atrial Fibrillation/Flutter: EKG on admission with A Flutter 4:1 but rate controlled. On Eliquis and Metoprolol at home. Concern for decompensated HFrEF exacerbation. History of DCCV in 12/2019 with unclear length of time he remained in NSR.??Plan was for ablation as an outpatient but will attempt to do this while inpatient.  - CTM rates, telemetry   - Continue home metoprolol??  - Continue home Eliquis 5mg  BID  - Plan for ablation once plan for valve replacement is definitive (outpatient vs inpatient)    AKI - NAGMA: Cr down trended with diureses likely 2/2 cardiorenal and Bicarb normalized from 18 on admission. Cr began up trending in setting of aggressive diuresis, will back off on diuresis for now and monitor BMP.  - Daily BMP  - Diuresis  per above  - f/u renin aldosterone ratio and AM cortisol for RTA evaluation  ??  HTN: Home meds unclear, patient not always taking consistently and did not know what he was taking at home.   - Hold hydralazine and Imdur since unclear if patient taking at home  - Continue home Amlodipine??and metoprolol??  ??  CML - Iron Deficiency Anemia: Ferritin 67.3, Iron 33, and Iron Sat 6% suggesting iron deficiency anemia. Also with history of CML. Previously on Bosutinib but unclear if patient has been taking this??recently.??Heme/onc consulted, appreciated recs.  - Discussed??Bosutinib with Heme/Onc, recommend holding it while inpatient   - Heme onc plans to transition to Asciminib 40mg  bid as outpatient  - No contraindication to IV iron, will replete??while inpatient  ??  T2DM - ASCVD Risk: Uncontrolled, A1c >14.??Home meds include SSI??with meals, Lantus 32 units nightly, Ozempic 0.25mg  weekly,   -??continue??SSI  - Lantus??25??units nightly with 4 units of Lispro TID with meals   - Sub Atorvastatin for home Crestor  - Holding Losartan as above    Daily Checklist:  Diet: Regular diet, 2g Na restriction, 2L fluid restriction  DVT PPx: Patient Already on Full Anticoagulation with Eliquis  Electrolytes: Replete Potassium to >/=4 and Magnesium to >/=2  Code Status: Full Code    Team Contact Information:   Primary Team: Cardiology - Team 1 (MEDC1)  Primary Resident: Colin Colonel Lilyann Gravelle, MD  Resident's Pager: [phone] (Cardiology Team 1 Intern)    Interval History:   No acute events overnight. Feeling well and subjectively euvolemic. Awaiting assessment by structural team and CT surgery, curious about their plan for possible surgery.     ROS: Denies headache, chest pain, shortness of breath, abdominal pain, nausea, vomiting.    Objective:   Temp:  [36.4 ??C (97.5 ??F)-37 ??C (98.6 ??F)] 36.5 ??C (97.7 ??F)  Heart Rate:  [63-93] 63  Resp:  [16-18] 18  BP: (125-145)/(78-85) 125/79  SpO2:  [93 %-100 %] 96 %    Gen: WDWN in NAD, answers questions appropriately  Eyes: sclera anicteric, EOMI  HENT: atraumatic, MMM, OP w/o erythema or exudate   Heart: RRR, S1, S2, no M/R/G, no chest wall tenderness  Lungs: CTAB, no crackles or wheezes, no use of accessory muscles  Abdomen: Normoactive bowel sounds, soft, non tended, decreased abdominal girth/swelling, no rebound/guarding  Extremities: no clubbing, cyanosis, trace pitting edema in lower extremities improved from prior  Psych: Alert, oriented, appropriate mood and affect    Labs/Studies: Labs and Studies from the last 24hrs per EMR and Reviewed

## 2020-12-01 NOTE — Unmapped (Signed)
Mr Colin Hunter has been NPO since midnight in preparation for possible procedure today. Denies pain. Vitals stable. R IJ cath site remains benign. Maintaining rate controlled atrial flutter on telemetry. No acute issues overnight.    Problem: Adult Inpatient Plan of Care  Goal: Plan of Care Review  Outcome: Ongoing - Unchanged  Goal: Absence of Hospital-Acquired Illness or Injury  Outcome: Ongoing - Unchanged  Intervention: Prevent and Manage VTE (Venous Thromboembolism) Risk  Recent Flowsheet Documentation  Taken 11/30/2020 2000 by Nilay Mangrum, RN  VTE Prevention/Management: (Eliquis)   anticoagulant therapy   bleeding precautions maintained   bleeding risk factors identified   other (see comments)  Goal: Rounds/Family Conference  Outcome: Ongoing - Unchanged     Problem: Fall Injury Risk  Goal: Absence of Fall and Fall-Related Injury  Outcome: Ongoing - Unchanged     Problem: Adult Inpatient Plan of Care  Goal: Patient-Specific Goal (Individualized)  Outcome: Progressing  Goal: Optimal Comfort and Wellbeing  Outcome: Progressing  Goal: Readiness for Transition of Care  Outcome: Progressing     Problem: Cardiac Output Decreased (Heart Failure)  Goal: Optimal Cardiac Output  Outcome: Progressing     Problem: Dysrhythmia (Heart Failure)  Goal: Stable Heart Rate and Rhythm  Outcome: Progressing     Problem: Diabetes Comorbidity  Goal: Blood Glucose Level Within Targeted Range  Outcome: Progressing     Problem: Chest Pain  Goal: Resolution of Chest Pain Symptoms  Outcome: Progressing

## 2020-12-01 NOTE — Unmapped (Signed)
Cardiology - Team 1 [facility]) Progress Note    Assessment & Plan:   Colin Hunter is a 50 y.o. 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    Principal Problem:    Syncope  Active Problems:    Atrial fibrillation (CMS-HCC)    Chronic myeloid leukemia (CMS-HCC)    CKD (chronic kidney disease) stage 3, GFR 30-59 ml/min (CMS-HCC)    Congenital pulmonary valve stenosis    Gastroesophageal reflux disease    Heart failure with reduced ejection fraction (CMS-HCC)    Type 2 diabetes mellitus treated with insulin (CMS-HCC)  Resolved Problems:    * No resolved hospital problems. *    Recurrent Syncopal and Pre-Syncopal Episodes:??Admitted for recurrent pre/syncopal events likely in the setting of Afib/Aflutter and HFrEF/pulmonary insufficiency as below. No further events during hospitalization. Negative workup including CT/MRI head, carotid dopplers, negative orthostatics, and no evidence of seizures on EEG. Working on optimizing his volume status. Suspect these events are most likely 2/2 to right sided HF with pulmonic insufficiency leading to volume overload superimposed on Afib/Aflutter.   - RHC revealed elevated RAP, RVP, and PA pressure. PCWP was only slightly elevated.  - Given RHC findings, will have structural evaluate him for possible pulmonic valve replacement   - plan for ablation once plan for valve replacement is definitive   - tele, fall precautions, PT/OT  - management of HFrEF and A-fib per below  ??  HFrEF??(EF 40% with RV Systolic Dysfunction) - Volume Overload 2/2 Pulmonary Insufficiency: History of congenital pulmonic stenosis that was repaired in childhood, now has post-op pulmonic insufficiency. Echo in 11/2020 with EF 40%, Moderate RV systolic dysfunction. GDMT at Home: Torsemide 20mg  BID, Metoprolol Succinate 200mg  daily, Losartan 25mg  daily. Endorses ~40lb weight gain (dry weight reportedly 244lbs, 282lbs on admission), increasing LE edema prior to admission. BNP 118. JVP elevated on admission, with non-pitting edema in LE bilaterally.??Will aggressively diurese to optimize volume status, though has been complicated with AKI on CKD. RHC revealed elevated RAP, RVP, and PA pressure. PCWP was only slightly elevated.  - Diuresis plan: hold on diuresis today given euvolemic exam and improvement in Cr with holding lasix   - BMP q12   - Home metoprolol at half dose succinate 100mg  daily  - Hold home Losartan 25 mg daily given AKI and hyperkalemia  - Hold home Toresemide   - Based on RHC results as above, will have structural evaluate him for possible pulmonic valve replacement     Atrial Fibrillation/Flutter: EKG on admission with A Flutter 4:1 but rate controlled. On Eliquis and Metoprolol at home. Concern for decompensated HFrEF exacerbation. History of DCCV in 12/2019 with unclear length of time he remained in NSR.??Plan was for ablation as an outpatient but will attempt to do this while inpatient.  -CTM rates, telemetry   -Continue home metoprolol??  -Continue home Eliquis 5mg  BID  - plan for ablation once plan for valve replacement is definitive     AKI - NAGMA: Cr down trended with diureses likely 2/2 cardiorenal and Bicarb normalized from 18 on admission. Cr began up trending in setting of aggressive diuresis, will back off on diuresis for now and monitor BMP.  -Daily BMP  -Diuresis per above  -f/u renin aldosterone ratio and AM cortisol for RTA evaluation  ??  HTN: Home meds unclear, patient not always taking consistently and did not know what he was taking at home.   -Hold hydralazine and Imdur since unclear if patient taking at home  -Continue home Amlodipine??and metoprolol??  ??  CML - Iron Deficiency Anemia: Ferritin 67.3, Iron 33, and Iron Sat 6% suggesting iron deficiency anemia. Also with history of CML. Previously on Bosutinib but unclear if patient has been taking this??recently.??Heme/onc consulted, appreciated recs.  - Discussed??Bosutinib with Heme/Onc, recommend holding it while inpatient   - Heme onc plans to transition to Asciminib 40mg  bid as outpatient  - No contraindication to IV iron, will replete??while inpatient  ??  T2DM - ASCVD Risk: Uncontrolled, A1c >14.??Home meds include SSI??with meals, Lantus 32 units nightly, Ozempic 0.25mg  weekly,   -??continue??SSI  - Lantus??25??units nightly with 4 units of Lispro TID with meals   - Sub Atorvastatin for home Crestor  - Holding Losartan as above    Daily Checklist:  Diet: Regular diet, 2g Na restriction, 2L fluid restriction  DVT PPx: Patient Already on Full Anticoagulation with Eliquis  Electrolytes: Replete Potassium to >/=4 and Magnesium to >/=2  Code Status: Full Code    Team Contact Information:   Primary Team: Cardiology - Team 1 (MEDC1)  Primary Resident: Janann Colonel Aleli Navedo, MD  Resident's Pager: [phone] (Cardiology Team 1 Intern)    Interval History:   No acute events overnight.  Tele continues to show Aflutter, rates controlled. Tolerated RHC well with no complaints. Feels his volume status is at baseline.     ROS: Denies headache, chest pain, shortness of breath, abdominal pain, nausea, vomiting.    Objective:   Temp:  [36.5 ??C (97.7 ??F)-36.7 ??C (98.1 ??F)] 36.5 ??C (97.7 ??F)  Heart Rate:  [33-167] 80  Resp:  [16-18] 17  BP: (106-152)/(66-100) 131/85  SpO2:  [90 %-100 %] 97 %    Gen: WDWN in NAD, answers questions appropriately  Eyes: sclera anicteric, EOMI  HENT: atraumatic, MMM, OP w/o erythema or exudate   Heart: RRR, S1, S2, no M/R/G, no chest wall tenderness  Lungs: CTAB, no crackles or wheezes, no use of accessory muscles  Abdomen: Normoactive bowel sounds, soft, non tended, decreased abdominal girth/swelling, no rebound/guarding  Extremities: no clubbing, cyanosis, trace pitting edema in lower extremities improved from prior  Psych: Alert, oriented, appropriate mood and affect    Labs/Studies: Labs and Studies from the last 24hrs per EMR and Reviewed

## 2020-12-01 NOTE — Unmapped (Signed)
Pt alert and oriented x4. Cardiac cath completed, VS stable. Dressing intact with no signs of bleeding. Tylenol given for pain at cath insertion site. Pt with active bowel sounds and sufficient urine output. No new skin breakdown this shift. No s/s infection this shift. Blood sugars in the 300s. MD made aware. No new ordered. Fall precautions and pt safety maintained. Will continue to monitor.      Problem: Adult Inpatient Plan of Care  Goal: Plan of Care Review  Outcome: Ongoing - Unchanged  Goal: Patient-Specific Goal (Individualized)  Outcome: Ongoing - Unchanged  Goal: Absence of Hospital-Acquired Illness or Injury  Outcome: Ongoing - Unchanged  Goal: Optimal Comfort and Wellbeing  Outcome: Ongoing - Unchanged  Goal: Readiness for Transition of Care  Outcome: Ongoing - Unchanged  Goal: Rounds/Family Conference  Outcome: Ongoing - Unchanged     Problem: Cardiac Output Decreased (Heart Failure)  Goal: Optimal Cardiac Output  Outcome: Ongoing - Unchanged     Problem: Dysrhythmia (Heart Failure)  Goal: Stable Heart Rate and Rhythm  Outcome: Ongoing - Unchanged     Problem: Fall Injury Risk  Goal: Absence of Fall and Fall-Related Injury  Outcome: Ongoing - Unchanged     Problem: Diabetes Comorbidity  Goal: Blood Glucose Level Within Targeted Range  Outcome: Ongoing - Unchanged     Problem: Chest Pain  Goal: Resolution of Chest Pain Symptoms  Outcome: Ongoing - Unchanged

## 2020-12-01 NOTE — Unmapped (Signed)
DIVISION OF CARDIOLOGY  [employer], [place]                                                                 Structural Heart Disease Consult Note  Date of Service: 12/01/20    ASSESSMENT/PROBLEM LIST     Colin Hunter is a 50 y.o. 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     1. H/o Congential Pulmonic Stenosis  2. Severe Pulmonary Insufficiency  Patient presenting with multiple episodes of presyncope, syncope and moderate RV dysfunction with severe RV dilatation.  He meets class I indication for pulmonary valve replacement.  The major question at this time is if he would benefit from a transcatheter approach versus surgical pulmonic valve replacement.  We would certainly benefit from additional data including RV size as well as better characterization of the valve with cardiac CT, however I appreciate that we are limited by his current AKI in the setting of CKD III.  His creatinine currently is 2.7 and had peaked at 3.5 during this admission.  Review of his labs in our system showed that he has been as low as 2 but has also bounced around higher than that.  We will discuss this complicated case further to see what other preprocedure planning needs to occur. We appreciate the input of our CT surgery colleagues. He needs definitive therapy of his atrial flutter and we would ask that EP attempt a therapy this admission.    RECOMMENDATIONS/PLAN     - agree with EP consult for definitive therapy of atrial flutter  - appreciate CT surgery input  - will need a cardiac MRI for RV assessment when able from a renal perspective  - will need a cardiac CT for preplanning for potential transcatheter pulmonic valve replacement when able from a renal perspective      Plan to be discussed with attending Dr. Simmie Davies.  Please contact us for any questions.        HISTORY     REASON FOR CONSULT: Pulmonic Valve Insufficiency     HISTORY OF PRESENT ILLNESS:   Colin Hunter is a 50 y.o. male with a history listed above who is consulted for severe pulmonic insufficiency,     Patient has previously been seen in the adult congenital clinic by Dr Simmie Davies in June of 2021. At that time he was faring well but had 6 admissions for HF over an 8 month span. He was also in atrial flutter at the time. 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 He was noted to have a depressed EF (~35%) that was thought to be non-ischemic given a cath that showed no notable CAD. His workup at the time also had shown  moderate to severe PI, along with RV dilatation. There was a plan to send him for cardiac MRI in order to fully investigate his PI and RV size but that for some reason never occurred and patient had not followed up since. There was also a plan at the time to consider atrial flutter ablation to try  to get him out of atrial flutter.     He was admitted 11/25/20 with weakness and dizziness in HF clinic. He had actually been recently admitted at [facility] and then again at [facility] earlier this month. Patient notes issues with presyncope starting about a year ago.  He notes that he has had progressively increasing episodes to where he is having presyncope 2-3 times weekly currently.  He did note 1 episode of true syncope about 2 months ago when he completely lost consciousness.  He has had trouble with volume overload and has required 3 hospitalizations for heart failure since we last saw him in June 2021.  He notes orthopnea with up to 5 pillows at night when he is carrying extra fluid.  He is currently on torsemide and takes an extra torsemide in the evening if he feels like he is accumulating fluid.  His dry weight is 245 pounds.  He does note intermittent palpitations but most the time does not feel his heart racing or skipping beats. He also notes shortness of breath.  Of note he has a history of CML with resistance and intolerance to imatinib for which he follows with Dr. Malen Gauze in hematology/oncology here at [facility].    PAST MEDICAL HISTORY:  Past Medical History:   Diagnosis Date   ??? CKD (chronic kidney disease) stage 3, GFR 30-59 ml/min (CMS-HCC)    ??? CML (chronic myelocytic leukemia) (CMS-HCC)    ??? HFrEF (heart failure with reduced ejection fraction) (CMS-HCC)    ??? Paroxysmal atrial fibrillation (CMS-HCC)    ??? PFO (patent foramen ovale)    ??? Right atrial thrombus    ??? T2DM (type 2 diabetes mellitus) (CMS-HCC)        PAST SURGICAL HISTORY:  No past surgical history on file.    REVIEW OF SYSTEMS:  12 system review of systems was completed and was non-contributory except as noted above, in the history of present illness.    NYHA CLASS: III    CARDIODIAGNOSTICS     ECG:   4:1 Flutter with RBBB    ECHO:       TTE 11/26/20  1. The left ventricle is normal in size with normal wall thickness.  2. The left ventricular systolic function is mildly decreased, LVEF is  visually estimated at 45%.  3. The left atrium is mildly dilated in size.  4. The right ventricle is severely dilated in size, with moderately reduced  systolic function.  5. The right atrium is moderately dilated  in size.  6. Limited study to assess ventricular function.  ??  TEE 12/27/19  1. There is a no thrombus seen in the left atrium/LAA.  2. There is a no thrombus seen in the right atrium/RAA.  3. The right ventricle is severely dilated in size, with reduced systolic  function.  4. The pulmonary artery is dilated.  5. There is severe pulmonic regurgitation.  6. Left ventricular systolic function is low normal.     CARDIAC CATH:  RHC 11/30/20  - Normal CO and CI. Elevated right heart pressures. ??  ?? Pressures   Right atrium Mean 13 mm Hg   Right ventricle Systolic 53 mm Hg, End-diastolic 13 mm Hg (13)   Pulmonary artery  Systolic 48 mm Hg, Diastolic 5 mm Hg, Mean 22 mm Hg   Pulmonary capillary wedge Mean 13 mm Hg   ??         CURRENT MEDICATIONS:  Current Facility-Administered Medications   Medication Dose  Route Frequency Provider Last Rate Last Admin   ??? acetaminophen (TYLENOL) tablet 650 mg  650 mg Oral Q6H PRN Janann Colonel Power, MD   650 mg at 11/30/20 1127   ??? amLODIPine (NORVASC) tablet 10 mg  10 mg Oral Daily Janann Colonel Power, MD   10 mg at 12/01/20 1610   ??? atorvastatin (LIPITOR) tablet 40 mg  40 mg Oral Nightly Janann Colonel Power, MD   40 mg at 11/30/20 2337   ??? dextrose (D10W) 10% bolus 125 mL  12.5 g Intravenous Q10 Min PRN Janann Colonel Power, MD       ??? dextrose 50 % in water (D50W) 50 % solution 12.5 g  12.5 g Intravenous Q10 Min PRN Janann Colonel Power, MD       ??? glucagon injection 1 mg  1 mg Intramuscular Once PRN Janann Colonel Power, MD       ??? glucose chewable tablet 16 g  16 g Oral Q10 Min PRN Janann Colonel Power, MD       ??? insulin glargine (LANTUS) injection 25 Units  25 Units Subcutaneous Nightly Janann Colonel Power, MD   25 Units at 11/29/20 2057   ??? insulin lispro (HumaLOG) injection 0-20 Units  0-20 Units Subcutaneous ACHS Janann Colonel Power, MD   9 Units at 11/30/20 1801   ??? insulin lispro (HumaLOG) injection 4 Units  4 Units Subcutaneous 3xd Meals Janann Colonel Power, MD   4 Units at 11/30/20 1801   ??? melatonin tablet 3 mg  3 mg Oral Nightly PRN Janann Colonel Power, MD       ??? metoprolol succinate (TOPROL-XL) 24 hr tablet 100 mg  100 mg Oral Daily Janann Colonel Power, MD   100 mg at 12/01/20 9604   ??? pantoprazole (PROTONIX) EC tablet 40 mg  40 mg Oral Daily Janann Colonel Power, MD   40 mg at 12/01/20 5409   ??? polyethylene glycol (MIRALAX) packet 17 g  17 g Oral Daily PRN Sydney M Power, MD       ??? pregabalin (LYRICA) capsule 75 mg  75 mg Oral BID Sydney M Power, MD   75 mg at 12/01/20 8119   ??? senna (SENOKOT) tablet 2 tablet  2 tablet Oral Nightly PRN Janann Colonel Power, MD            ALLERGIES:  No Known Allergies     FAMILY HISTORY  Family History   Problem Relation Age of Onset   ??? Heart disease Father SOCIAL HISTORY:   He  reports that he quit smoking about 2 years ago. His smoking use included cigarettes. He has a 3.00 pack-year smoking history. He has never used smokeless tobacco. He reports that he does not drink alcohol and does not use drugs.     PHYSICAL EXAM:  Vitals:    12/01/20 0500 12/01/20 0546 12/01/20 0742 12/01/20 0848   BP: 140/82   128/83   Pulse: 68  63 64   Resp: 16   18   Temp: 37 ??C (98.6 ??F)   36.7 ??C (98.1 ??F)   TempSrc: Oral   Oral   SpO2: 99%   96%   Weight:  (!) 117.3 kg (258 lb 8 oz)     Height:             Intake/Output Summary (Last 24 hours) at 12/01/2020 1039  Last data filed at 12/01/2020 0600  Gross per 24 hour   Intake 240 ml   Output 2250 ml  Net -2010 ml       GENERAL: Alert, cooperative, no distress, appears stated age.  HEENT: Unremarkable  NECK: Carotids 2+ bilaterally, no JVD appreciated  CV: RRR, normal S1 with split S2, soft diastolic murmur present  PULM: CTAB  ABD: S/NT/ND, bowel sounds active,  no masses, no organomegaly.  EXTREMITIES: Extremities normal, no cyanosis, trace LE edema.  PULSES: PT's 2+ symmetric bilaterally.    SKIN: No lower extremity rashes or ulcers.  NEURO: Alert, interactive, and appropriate, grossly moving all 4 extremities.      OBJECTIVE DATA     LABS: Reviewed    Lab Results   Component Value Date    WBC 9.0 11/30/2020    HGB 12.5 (L) 11/30/2020    HCT 35.8 (L) 11/30/2020    PLT 160 11/30/2020     Lab Results   Component Value Date    INR 1.14 11/12/2020    APTT 70.1 (H) 06/11/2020     Lab Results   Component Value Date    NA 135 11/30/2020    K 4.6 11/30/2020    CL 107 11/30/2020    CO2 21.0 11/30/2020    BUN 66 (H) 11/30/2020    CREATININE 2.93 (H) 11/30/2020    GLU 333 (H) 11/30/2020    CALCIUM 9.6 11/30/2020    MG 2.1 11/30/2020    PHOS 2.7 11/11/2020     Lab Results   Component Value Date    BILITOT 0.9 11/24/2020    BILIDIR <0.10 06/13/2020    PROT 8.0 11/24/2020    ALBUMIN 4.1 11/24/2020    ALT 23 11/24/2020    AST 23 11/24/2020 ALKPHOS 344 (H) 11/24/2020         Component Value Date/Time    PROBNP 2,260.0 (H) 08/26/2019 1345     No results found for: LDL, HDL, TRIG  Lab Results   Component Value Date    TSH 3.009 11/24/2020     IMAGING: Reviewed    ______________________________________________________________________    G. Florentina Addison, MD  Structural Heart Disease Fellow, PGY-8  [facility]  [place] [facility] [place], [place]

## 2020-12-01 NOTE — Unmapped (Signed)
Cardiac Surgery Consult Note    Requesting Attending Physician :  Charna Archer,*    Consulting Physician: Dr. Cain Sieve    Reason for Consult:  Pulmonary Insufficiency     History of Present Illness:     Colin Hunter is a 50 y.o. 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  Today, Colin Hunter states that he started experiencing dizziness and syncope for the very first time approximately 1 year ago.  He states that it has been progressively getting worse.  He notes that about 2 to 3 weeks ago, he experienced 2 syncopal events on the same day at [facility].  He also states that he has been having arrhythmias, however, he states that he does not feel them when they happen.  Prior to admission to the hospital, he had noticed distention of his abdomen, swelling of his ankles, and a weight gain from 244 pounds to 280 pounds.  He states that he has no recollection of some of his syncopal events.  He states that he has been taking Eliquis, however, he has not taken any in the past 2 days.  He does use 2 L of home oxygen for which he uses mostly for sleep.  He also endorses fatigue, shortness of breath and sleeps elevated with 4-7 pillows at night.    Allergies:  NKDA    Medications:   Current Facility-Administered Medications   Medication Dose Route Frequency Provider Last Rate Last Admin   ??? acetaminophen (TYLENOL) tablet 650 mg  650 mg Oral Q6H PRN Janann Colonel Power, MD   650 mg at 11/30/20 1127   ??? amLODIPine (NORVASC) tablet 10 mg  10 mg Oral Daily Janann Colonel Power, MD   10 mg at 12/01/20 1610   ??? atorvastatin (LIPITOR) tablet 40 mg  40 mg Oral Nightly Janann Colonel Power, MD   40 mg at 11/30/20 2337   ??? dextrose (D10W) 10% bolus 125 mL  12.5 g Intravenous Q10 Min PRN Janann Colonel Power, MD       ??? dextrose 50 % in water (D50W) 50 % solution 12.5 g  12.5 g Intravenous Q10 Min PRN Janann Colonel Power, MD       ??? glucagon injection 1 mg  1 mg Intramuscular Once PRN Janann Colonel Power, MD       ??? glucose chewable tablet 16 g  16 g Oral Q10 Min PRN Janann Colonel Power, MD       ??? insulin glargine (LANTUS) injection 25 Units  25 Units Subcutaneous Nightly Janann Colonel Power, MD   25 Units at 11/29/20 2057   ??? insulin lispro (HumaLOG) injection 0-20 Units  0-20 Units Subcutaneous ACHS Janann Colonel Power, MD   9 Units at 11/30/20 1801   ??? insulin lispro (HumaLOG) injection 4 Units  4 Units Subcutaneous 3xd Meals Janann Colonel Power, MD   4 Units at 12/01/20 1053   ??? melatonin tablet 3 mg  3 mg Oral Nightly PRN Janann Colonel Power, MD       ??? metoprolol succinate (TOPROL-XL) 24 hr tablet 100 mg  100 mg Oral Daily Janann Colonel Power, MD   100 mg at 12/01/20 9604   ??? pantoprazole (PROTONIX) EC tablet 40 mg  40 mg Oral Daily Janann Colonel Power, MD   40 mg at 12/01/20 5409   ???  polyethylene glycol (MIRALAX) packet 17 g  17 g Oral Daily PRN Sydney M Power, MD       ??? pregabalin (LYRICA) capsule 75 mg  75 mg Oral BID Janann Colonel Power, MD   75 mg at 12/01/20 6045   ??? senna (SENOKOT) tablet 2 tablet  2 tablet Oral Nightly PRN Janann Colonel Power, MD           Medical History:  Past Medical History:   Diagnosis Date   ??? CKD (chronic kidney disease) stage 3, GFR 30-59 ml/min (CMS-HCC)    ??? CML (chronic myelocytic leukemia) (CMS-HCC)    ??? HFrEF (heart failure with reduced ejection fraction) (CMS-HCC)    ??? Paroxysmal atrial fibrillation (CMS-HCC)    ??? PFO (patent foramen ovale)    ??? Right atrial thrombus    ??? T2DM (type 2 diabetes mellitus) (CMS-HCC)         Surgical History:  Congenital Pulmonary Valve Repair at 50 y.o.    Social History:  Social History     Tobacco Use   Smoking Status Former Smoker   ??? Packs/day: 0.50   ??? Years: 6.00   ??? Pack years: 3.00   ??? Types: Cigarettes   ??? Quit date: 09/11/2018   ??? Years since quitting: 2.2   Smokeless Tobacco Never Used     Social History     Substance and Sexual Activity   Alcohol Use Never     Social History     Substance and Sexual Activity   Drug Use Never     Living situation: the patient lives with their spouse and granddaughter.    Family History:  The patient's family history includes Heart disease in his father..    Immediate family history of CAD < 34 years of age: no      Review of Systems:  A 12 system review of systems was negative except as noted in HPI.    Physical Exam:  Vitals:    12/01/20 1119   BP: 125/79   Pulse: 63   Resp: 18   Temp: 36.5 ??C (97.7 ??F)   SpO2: 96%     General: alert and oriented, resting comfortably in NAD  HEENT: normocephalic, atraumatic. sclera anicteric, MMM, Dentition is Good    Neck: Supple.  Pulmonary: non-labored breathing on room air, lungs CTAB, No wheezes, rales or rhonchi.   CV: regular rate and rhythm. Normal S1, S2. 2+ DP and radial pulses bilaterally.   Abdomen/GI: soft, non-tender, non-distended. No rebound or guarding present.  Neurologic: A&O x 3, answering questions appropriately. strength equal in upper & lower extremities bilaterally. sensation intact throughout. no facial droop noted.  Extremities: warm and well perfused.   Skin: warm and dry. No rashes. Well healed sternotomy incision     Diagnostic Studies:    Data Review:    All lab results last 24 hours:    Recent Results (from the past 24 hour(s))   POCT Glucose    Collection Time: 11/30/20  2:26 PM   Result Value Ref Range    Glucose, POC 363 (H) 70 - 179 mg/dL   Basic Metabolic Panel    Collection Time: 11/30/20  3:06 PM   Result Value Ref Range    Sodium 135 135 - 145 mmol/L    Potassium 4.6 3.4 - 4.8 mmol/L    Chloride 107 98 - 107 mmol/L    CO2 21.0 20.0 - 31.0 mmol/L    Anion Gap 7 5 - 14  mmol/L    BUN 66 (H) 9 - 23 mg/dL    Creatinine 7.82 (H) 0.60 - 1.10 mg/dL    BUN/Creatinine Ratio 23     eGFR CKD-EPI (2021) Male 25 (L) >=60 mL/min/1.81m2    Glucose 333 (H) 70 - 179 mg/dL    Calcium 9.6 8.7 - 95.6 mg/dL   Magnesium Level    Collection Time: 11/30/20 3:06 PM   Result Value Ref Range    Magnesium 2.1 1.6 - 2.6 mg/dL   POCT Glucose    Collection Time: 11/30/20  5:54 PM   Result Value Ref Range    Glucose, POC 308 (H) 70 - 179 mg/dL   POCT Glucose    Collection Time: 11/30/20 11:22 PM   Result Value Ref Range    Glucose, POC 99 70 - 179 mg/dL   POCT Glucose    Collection Time: 12/01/20  8:47 AM   Result Value Ref Range    Glucose, POC 179 70 - 179 mg/dL   CBC    Collection Time: 12/01/20 11:03 AM   Result Value Ref Range    WBC 8.9 3.6 - 11.2 10*9/L    RBC 4.86 4.26 - 5.60 10*12/L    HGB 13.2 12.9 - 16.5 g/dL    HCT 21.3 08.6 - 57.8 %    MCV 82.8 77.6 - 95.7 fL    MCH 27.2 25.9 - 32.4 pg    MCHC 32.8 32.0 - 36.0 g/dL    RDW 46.9 (H) 62.9 - 15.2 %    MPV 10.0 6.8 - 10.7 fL    Platelet 183 150 - 450 10*9/L       COVID status: Negative (08/16)       Imaging: All pertinent imaging reviewed by the Cardiac [facility]) team.       EXAM: XR CHEST 2 VIEWS  DATE: 11/24/2020 2:23 PM  IMPRESSION:  *Central pulmonary artery enlargement likely due to pulmonary hypertension.  *Stable cardiomegaly.  *No acute cardiopulmonary abnormality.      Exam Type:     ECHOCARDIOGRAM FOLLOW UP/LIMITED ECHO  Exam Date:     11/26/2020 12:20 PM  Summary    1. The left ventricle is normal in size with normal wall thickness.    2. The left ventricular systolic function is mildly decreased, LVEF is  visually estimated at 45%.    3. The left atrium is mildly dilated in size.    4. The right ventricle is severely dilated in size, with moderately reduced  systolic function.    5. The right atrium is moderately dilated  in size.    6. Limited study to assess ventricular function.    CARDIAC CATHETERIZATION REPORT  Date of Procedure: 12/01/2020   Findings:  1. Normal CO and CI. Elevated right heart pressures.   ??  ?? Pressures   Right atrium Mean 13 mm Hg   Right ventricle Systolic 53 mm Hg, End-diastolic 13 mm Hg (13)   Pulmonary artery  Systolic 48 mm Hg, Diastolic 5 mm Hg, Mean 22 mm Hg   Pulmonary capillary wedge Mean 13 mm Hg   ??  Arterial saturation: 96%  Mixed venous saturation: 62%  ??  Fick cardiac output: 5.49 L/min  Fick cardiac index: 2.26 L/min/m^2  ??  Thermal cardiac output: 5.67 L/min  Thermal cardiac index: 2.34 L/min/m^2  ??  Systemic vascular resistance (SVR): 1364 dynes*s/cm^5  Pulmonary vascular resistance (PVR): 1.64 Wood units      Assessment/Plan:   Colin Greaser  Hunter is a  50 y.o. 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 The Cardiac Surgery [facility]) service was consulted due to severe pulmonary insufficiency with severe RV dilation. In meeting with the patient we discussed surgery vs transcatheter options for his pulmonary valve. Due to him having a PFO, severe pulmonary regurgitation and atrial fib/flutter we discussed how operatively we can fix all three of those issues at once. The patient stated he prefered surgery rather than transcatheter procedures. In order for [place] to proceed forward with surgery we would like to have a left heart catheterization. Additionally, we would recommend a CT head w/o contrast and carotid duplex studies due to his presyncope/syncope concerns. He is at risk for right to left shunting and strokes due to his PFO. We will also discuss this case with the congenital surgery team as this all stems from a congenital issue.     - obtain carotid duplex   - obtain LHC  - recommend CT head without contrast    We will follow closely. Please reach out with any questions or concerns.       This was discussed with consulting physician, Dr. Cain Sieve, who agrees with the plan of care.Please page with any questions or change in patient status. We appreciate the consult.       LOS index_ present on admission: cardiac arrhythmia, requiring further investigation, treatment, or monitoring: afib/aflutter, congestive heart failure, requiring further investigation, treatment, or monitoring: RV severely dilated requiring aggresive diuresis , prior valve surgery: requiring further investigation, treatment, or monitoring: pulmonary valvotomy at 50 years old and syncope, requiring further investigation, treatment, or monitoring: presyncope/syncope workup with carotids and CT head    [place] Score: CHF  (2). Total: 2    Pieter Partridge, M.D.  Cardiothoracic Surgery I-6 PGY-5

## 2020-12-02 LAB — CBC
HEMATOCRIT: 36 % — ABNORMAL LOW (ref 39.0–48.0)
HEMOGLOBIN: 11.9 g/dL — ABNORMAL LOW (ref 12.9–16.5)
MEAN CORPUSCULAR HEMOGLOBIN CONC: 33.1 g/dL (ref 32.0–36.0)
MEAN CORPUSCULAR HEMOGLOBIN: 27.5 pg (ref 25.9–32.4)
MEAN CORPUSCULAR VOLUME: 83.1 fL (ref 77.6–95.7)
MEAN PLATELET VOLUME: 9.5 fL (ref 6.8–10.7)
PLATELET COUNT: 136 10*9/L — ABNORMAL LOW (ref 150–450)
RED BLOOD CELL COUNT: 4.33 10*12/L (ref 4.26–5.60)
RED CELL DISTRIBUTION WIDTH: 17 % — ABNORMAL HIGH (ref 12.2–15.2)
WBC ADJUSTED: 9.3 10*9/L (ref 3.6–11.2)

## 2020-12-02 LAB — BASIC METABOLIC PANEL
ANION GAP: 9 mmol/L (ref 5–14)
ANION GAP: 8 mmol/L (ref 5–14)
BLOOD UREA NITROGEN: 48 mg/dL — ABNORMAL HIGH (ref 9–23)
BLOOD UREA NITROGEN: 52 mg/dL — ABNORMAL HIGH (ref 9–23)
BUN / CREAT RATIO: 18
BUN / CREAT RATIO: 19
CALCIUM: 9.3 mg/dL (ref 8.7–10.4)
CALCIUM: 9.5 mg/dL (ref 8.7–10.4)
CHLORIDE: 105 mmol/L (ref 98–107)
CHLORIDE: 109 mmol/L — ABNORMAL HIGH (ref 98–107)
CO2: 20 mmol/L (ref 20.0–31.0)
CO2: 18 mmol/L — ABNORMAL LOW (ref 20.0–31.0)
CREATININE: 2.72 mg/dL — ABNORMAL HIGH
CREATININE: 2.75 mg/dL — ABNORMAL HIGH
EGFR CKD-EPI (2021) MALE: 28 mL/min/{1.73_m2} — ABNORMAL LOW (ref >=60)
EGFR CKD-EPI (2021) MALE: 27 mL/min/{1.73_m2} — ABNORMAL LOW (ref >=60)
GLUCOSE RANDOM: 227 mg/dL — ABNORMAL HIGH (ref 70–179)
GLUCOSE RANDOM: 154 mg/dL (ref 70–179)
POTASSIUM: 4.3 mmol/L (ref 3.4–4.8)
POTASSIUM: 4.6 mmol/L (ref 3.4–4.8)
SODIUM: 134 mmol/L — ABNORMAL LOW (ref 135–145)
SODIUM: 135 mmol/L (ref 135–145)

## 2020-12-02 LAB — MAGNESIUM
MAGNESIUM: 2.1 mg/dL (ref 1.6–2.6)
MAGNESIUM: 2.2 mg/dL (ref 1.6–2.6)

## 2020-12-02 LAB — ALDOSTERONE: ALDOSTERONE: <4 ng/dL

## 2020-12-02 LAB — APTT
APTT: 58.3 s — ABNORMAL HIGH (ref 24.9–36.9)
HEPARIN CORRELATION: 0.3

## 2020-12-02 LAB — RENIN ASSAY: RENIN PLASMA: 1.8 ng/mL/h

## 2020-12-02 MED ADMIN — metoprolol succinate (TOPROL-XL) 24 hr tablet 100 mg: 100 mg | ORAL | @ 13:00:00

## 2020-12-02 MED ADMIN — pregabalin (LYRICA) capsule 75 mg: 75 mg | ORAL | @ 13:00:00

## 2020-12-02 MED ADMIN — pregabalin (LYRICA) capsule 75 mg: 75 mg | ORAL | @ 02:00:00

## 2020-12-02 MED ADMIN — insulin lispro (HumaLOG) injection 0-20 Units: 0-20 [IU] | SUBCUTANEOUS | @ 13:00:00

## 2020-12-02 MED ADMIN — atorvastatin (LIPITOR) tablet 40 mg: 40 mg | ORAL | @ 02:00:00

## 2020-12-02 MED ADMIN — pantoprazole (PROTONIX) EC tablet 40 mg: 40 mg | ORAL | @ 13:00:00

## 2020-12-02 MED ADMIN — insulin lispro (HumaLOG) injection 6 Units: 6 [IU] | SUBCUTANEOUS | @ 22:00:00

## 2020-12-02 MED ADMIN — amLODIPine (NORVASC) tablet 10 mg: 10 mg | ORAL | @ 13:00:00

## 2020-12-02 MED ADMIN — insulin glargine (LANTUS) injection 25 Units: 25 [IU] | SUBCUTANEOUS | @ 02:00:00

## 2020-12-02 MED ADMIN — insulin lispro (HumaLOG) injection 6 Units: 6 [IU] | SUBCUTANEOUS | @ 17:00:00

## 2020-12-02 MED ADMIN — insulin lispro (HumaLOG) injection 0-20 Units: 0-20 [IU] | SUBCUTANEOUS | @ 22:00:00

## 2020-12-02 MED ADMIN — insulin lispro (HumaLOG) injection 0-20 Units: 0-20 [IU] | SUBCUTANEOUS | @ 02:00:00

## 2020-12-02 MED ADMIN — heparin 25,000 Units/250 mL (100 units/mL) in 0.45% saline infusion (premade): 13 [IU]/kg/h | INTRAVENOUS | @ 17:00:00

## 2020-12-02 MED ADMIN — insulin lispro (HumaLOG) injection 6 Units: 6 [IU] | SUBCUTANEOUS | @ 14:00:00

## 2020-12-02 NOTE — Unmapped (Signed)
Patient denies any pain, vs are stable, no chest pain. He was NPO last night, procedure/test was cancelled this morning. He ambulates in the room with no issues, will continue to monitor.    Problem: Adult Inpatient Plan of Care  Goal: Plan of Care Review  Outcome: Ongoing - Unchanged  Goal: Patient-Specific Goal (Individualized)  Outcome: Ongoing - Unchanged  Goal: Absence of Hospital-Acquired Illness or Injury  Outcome: Ongoing - Unchanged  Intervention: Identify and Manage Fall Risk  Recent Flowsheet Documentation  Taken 12/01/2020 0826 by Valentino Nose, RN  Safety Interventions:   fall reduction program maintained   low bed   nonskid shoes/slippers when out of bed  Goal: Optimal Comfort and Wellbeing  Outcome: Ongoing - Unchanged  Goal: Readiness for Transition of Care  Outcome: Ongoing - Unchanged  Goal: Rounds/Family Conference  Outcome: Ongoing - Unchanged     Problem: Cardiac Output Decreased (Heart Failure)  Goal: Optimal Cardiac Output  Outcome: Ongoing - Unchanged     Problem: Dysrhythmia (Heart Failure)  Goal: Stable Heart Rate and Rhythm  Outcome: Ongoing - Unchanged     Problem: Fall Injury Risk  Goal: Absence of Fall and Fall-Related Injury  Outcome: Ongoing - Unchanged  Intervention: Promote Injury-Free Environment  Recent Flowsheet Documentation  Taken 12/01/2020 1610 by Valentino Nose, RN  Safety Interventions:   fall reduction program maintained   low bed   nonskid shoes/slippers when out of bed     Problem: Diabetes Comorbidity  Goal: Blood Glucose Level Within Targeted Range  Outcome: Ongoing - Unchanged     Problem: Chest Pain  Goal: Resolution of Chest Pain Symptoms  Outcome: Ongoing - Unchanged

## 2020-12-02 NOTE — Unmapped (Signed)
Pt is alert and oriented x4.blood sugars checked and covered with sliding scale insulin.pt ambulatory in the room.plan of care updated.        Problem: Adult Inpatient Plan of Care  Goal: Plan of Care Review  Outcome: Progressing  Goal: Patient-Specific Goal (Individualized)  Outcome: Progressing  Goal: Absence of Hospital-Acquired Illness or Injury  Outcome: Progressing  Intervention: Prevent and Manage VTE (Venous Thromboembolism) Risk  Recent Flowsheet Documentation  Taken 12/01/2020 2202 by Joseph Art, RN  VTE Prevention/Management: anticoagulant therapy  Goal: Optimal Comfort and Wellbeing  Outcome: Progressing  Goal: Readiness for Transition of Care  Outcome: Progressing  Goal: Rounds/Family Conference  Outcome: Progressing     Problem: Cardiac Output Decreased (Heart Failure)  Goal: Optimal Cardiac Output  Outcome: Progressing     Problem: Dysrhythmia (Heart Failure)  Goal: Stable Heart Rate and Rhythm  Outcome: Progressing     Problem: Fall Injury Risk  Goal: Absence of Fall and Fall-Related Injury  Outcome: Progressing     Problem: Diabetes Comorbidity  Goal: Blood Glucose Level Within Targeted Range  Outcome: Progressing     Problem: Chest Pain  Goal: Resolution of Chest Pain Symptoms  Outcome: Progressing

## 2020-12-02 NOTE — Unmapped (Signed)
Cardiology - Team 1 [facility]) Progress Note    Assessment & Plan:   Colin Hunter is a 50 y.o. 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    Principal Problem:    Syncope  Active Problems:    Atrial fibrillation (CMS-HCC)    Chronic myeloid leukemia (CMS-HCC)    CKD (chronic kidney disease) stage 3, GFR 30-59 ml/min (CMS-HCC)    Congenital pulmonary valve stenosis    Gastroesophageal reflux disease    Heart failure with reduced ejection fraction (CMS-HCC)    Type 2 diabetes mellitus treated with insulin (CMS-HCC)  Resolved Problems:    * No resolved hospital problems. *    Recurrent Syncopal and Pre-Syncopal Episodes:??Admitted for recurrent pre/syncopal events likely in the setting of Afib/Aflutter and HFrEF/pulmonary insufficiency as below. No further events during hospitalization. Negative workup including CT/MRI head, carotid dopplers, negative orthostatics, and no evidence of seizures on EEG. Working on optimizing his volume status. Suspect these events are most likely 2/2 to right sided HF with pulmonic insufficiency leading to volume overload superimposed on Afib/Aflutter.   - RHC 8/23: RA  mean 13 mm Hg, RV 53/3, 13, PA 48/5 mean 22, PCWP mean 13  - Will proceed with ablation on 8/25 for definitive treatment of a-flutter  - Given RHC findings, structural heart team and CT surg will evaluate him for possible pulmonic valve replacement   - Tele, fall precautions, PT/OT  - f/u repeat carotid dopplers and CT head per CT surg for further eval of syncope  - Management of HFrEF and A-fib per below  ??  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 Echo in 11/2020 with EF 40%, Moderate RV systolic dysfunction. GDMT at Home: Torsemide 20mg  BID, Metoprolol Succinate 200mg  daily, Losartan 25mg  daily. Endorses ~40lb weight gain (dry weight reportedly 244lbs, 282lbs on admission), increasing LE edema prior to admission. BNP 118. JVP elevated on admission, with non-pitting edema in LE bilaterally.??Diuresis initially well tolerated but has been complicated with AKI on CKD. Currently euvolemic. RHC 8/23: RA  mean 13 mm Hg, RV 53/3, 13, PA 48/5 mean 22, PCWP mean 13  - Diuresis plan: cont to hold diuresis given euvolemic exam and improving Cr  - BMP q12   - Home metoprolol at half dose succinate 100mg  daily  - Hold home Losartan 25 mg daily given AKI and hyperkalemia  - Hold home Toresemide   - Will consider completing cardiac MRI and TAVR CT inpatient if renal function allows vs. outpatient    Atrial Fibrillation/Flutter: EKG on admission with A Flutter 4:1 but rate controlled. On Eliquis and Metoprolol at home. Concern for decompensated HFrEF exacerbation. History of DCCV in 12/2019 with unclear length of time he remained in NSR.??Plan was for ablation as an outpatient but will attempt to do this while inpatient.  - CTM rates, telemetry   - Continue home metoprolol??  - Holding home Eliquis 5mg  BID ISO of procedural planning  - Cont heparin gtt for ablation on 8/24    AKI - NAGMA: Cr down trended with diureses likely 2/2 cardiorenal and Bicarb normalized from 18 on admission. Cr began up trending in setting of aggressive diuresis, will back off on diuresis  for now and monitor BMP.  - Daily BMP  - Diuresis per above  ??  HTN: Home meds unclear, patient not always taking consistently and did not know what he was taking at home.   - Hold hydralazine and Imdur since unclear if patient taking at home  - Continue home Amlodipine??and metoprolol??  ??  CML - Iron Deficiency Anemia: Ferritin 67.3, Iron 33, and Iron Sat 6% suggesting iron deficiency anemia. Also with history of CML. Previously on Bosutinib but unclear if patient has been taking this??recently.??Heme/onc consulted, appreciated recs.  - Discussed??Bosutinib with Heme/Onc, recommend holding it while inpatient   - Heme onc plans to transition to Asciminib 40mg  bid as outpatient  - No contraindication to IV iron, will replete??while inpatient  ??  T2DM - ASCVD Risk: Uncontrolled, A1c >14.??Home meds include SSI??with meals, Lantus 32 units nightly, Ozempic 0.25mg  weekly,   -??continue??SSI  - Lantus??25??units nightly  - Increased to 6 units of Lispro TID with meals   - Sub Atorvastatin for home Crestor  - Holding Losartan as above    Daily Checklist:  Diet: Regular diet, 2g Na restriction, 2L fluid restriction  DVT PPx: Patient Already on Full Anticoagulation with Eliquis  Electrolytes: Replete Potassium to >/=4 and Magnesium to >/=2  Code Status: Full Code    Team Contact Information:   Primary Team: Cardiology - Team 1 (MEDC1)  Primary Resident: Janann Colonel Shiquita Collignon, MD  Resident's Pager: [phone] (Cardiology Team 1 Intern)    Interval History:   No acute events overnight. Feeling a bit frustrated with prolonged hospitalization. But hopeful for ablation to help his symptoms and would like to do some of the surgical workup to be done outpatient. Feeling euvolemic.     ROS: Denies headache, chest pain, shortness of breath, abdominal pain, nausea, vomiting.    Objective:   Temp:  [36.6 ??C (97.9 ??F)-36.7 ??C (98.1 ??F)] 36.7 ??C (98.1 ??F)  Heart Rate:  [63-65] 63  Resp:  [16-18] 18  BP: (125-139)/(75-86) 139/86  SpO2:  [95 %-98 %] 98 %    Gen: WDWN in NAD, answers questions appropriately  Eyes: sclera anicteric, EOMI  HENT: atraumatic, MMM, OP w/o erythema or exudate   Heart: RRR, S1, S2, no M/R/G, no chest wall tenderness  Lungs: CTAB, no crackles or wheezes, no use of accessory muscles  Abdomen: Normoactive bowel sounds, soft, non tended, decreased abdominal girth/swelling, no rebound/guarding  Extremities: no clubbing, cyanosis, trace pitting edema in lower extremities improved from prior  Psych: Alert, oriented, appropriate mood and affect    Labs/Studies: Labs and Studies from the last 24hrs per EMR and Reviewed

## 2020-12-03 LAB — BASIC METABOLIC PANEL
ANION GAP: 8 mmol/L (ref 5–14)
ANION GAP: 7 mmol/L (ref 5–14)
BLOOD UREA NITROGEN: 58 mg/dL — ABNORMAL HIGH (ref 9–23)
BLOOD UREA NITROGEN: 56 mg/dL — ABNORMAL HIGH (ref 9–23)
BUN / CREAT RATIO: 20
BUN / CREAT RATIO: 19
CALCIUM: 9.1 mg/dL (ref 8.7–10.4)
CALCIUM: 9.4 mg/dL (ref 8.7–10.4)
CHLORIDE: 111 mmol/L — ABNORMAL HIGH (ref 98–107)
CHLORIDE: 111 mmol/L — ABNORMAL HIGH (ref 98–107)
CO2: 20 mmol/L (ref 20.0–31.0)
CO2: 20 mmol/L (ref 20.0–31.0)
CREATININE: 2.88 mg/dL — ABNORMAL HIGH
CREATININE: 2.88 mg/dL — ABNORMAL HIGH
EGFR CKD-EPI (2021) MALE: 26 mL/min/{1.73_m2} — ABNORMAL LOW (ref >=60)
EGFR CKD-EPI (2021) MALE: 26 mL/min/{1.73_m2} — ABNORMAL LOW (ref >=60)
GLUCOSE RANDOM: 94 mg/dL (ref 70–179)
GLUCOSE RANDOM: 132 mg/dL (ref 70–179)
POTASSIUM: 4 mmol/L (ref 3.4–4.8)
POTASSIUM: 4.9 mmol/L — ABNORMAL HIGH (ref 3.4–4.8)
SODIUM: 139 mmol/L (ref 135–145)
SODIUM: 138 mmol/L (ref 135–145)

## 2020-12-03 LAB — MAGNESIUM
MAGNESIUM: 2.1 mg/dL (ref 1.6–2.6)
MAGNESIUM: 2.1 mg/dL (ref 1.6–2.6)

## 2020-12-03 LAB — CBC
HEMATOCRIT: 38.1 % — ABNORMAL LOW (ref 39.0–48.0)
HEMOGLOBIN: 12.4 g/dL — ABNORMAL LOW (ref 12.9–16.5)
MEAN CORPUSCULAR HEMOGLOBIN CONC: 32.4 g/dL (ref 32.0–36.0)
MEAN CORPUSCULAR HEMOGLOBIN: 27.2 pg (ref 25.9–32.4)
MEAN CORPUSCULAR VOLUME: 83.8 fL (ref 77.6–95.7)
MEAN PLATELET VOLUME: 9.3 fL (ref 6.8–10.7)
PLATELET COUNT: 138 10*9/L — ABNORMAL LOW (ref 150–450)
RED BLOOD CELL COUNT: 4.55 10*12/L (ref 4.26–5.60)
RED CELL DISTRIBUTION WIDTH: 17 % — ABNORMAL HIGH (ref 12.2–15.2)
WBC ADJUSTED: 10.3 10*9/L (ref 3.6–11.2)

## 2020-12-03 LAB — APTT
APTT: 105 s — ABNORMAL HIGH (ref 24.9–36.9)
HEPARIN CORRELATION: 0.6

## 2020-12-03 MED ADMIN — midazolam (VERSED) injection: INTRAVENOUS | @ 16:00:00 | Stop: 2020-12-03

## 2020-12-03 MED ADMIN — pregabalin (LYRICA) capsule 75 mg: 75 mg | ORAL | @ 13:00:00

## 2020-12-03 MED ADMIN — heparin 25,000 Units/250 mL (100 units/mL) in 0.45% saline infusion (premade): 13 [IU]/kg/h | INTRAVENOUS | @ 10:00:00 | Stop: 2020-12-03

## 2020-12-03 MED ADMIN — fentaNYL (PF) (SUBLIMAZE) injection: INTRAVENOUS | @ 15:00:00 | Stop: 2020-12-03

## 2020-12-03 MED ADMIN — insulin glargine (LANTUS) injection 25 Units: 25 [IU] | SUBCUTANEOUS | @ 02:00:00

## 2020-12-03 MED ADMIN — pantoprazole (PROTONIX) EC tablet 40 mg: 40 mg | ORAL | @ 13:00:00

## 2020-12-03 MED ADMIN — midazolam (VERSED) injection: INTRAVENOUS | @ 15:00:00 | Stop: 2020-12-03

## 2020-12-03 MED ADMIN — pregabalin (LYRICA) capsule 75 mg: 75 mg | ORAL | @ 02:00:00

## 2020-12-03 MED ADMIN — amLODIPine (NORVASC) tablet 10 mg: 10 mg | ORAL | @ 14:00:00

## 2020-12-03 MED ADMIN — metoprolol succinate (TOPROL-XL) 24 hr tablet 100 mg: 100 mg | ORAL | @ 14:00:00

## 2020-12-03 MED ADMIN — insulin lispro (HumaLOG) injection 6 Units: 6 [IU] | SUBCUTANEOUS | @ 21:00:00

## 2020-12-03 MED ADMIN — fentaNYL (PF) (SUBLIMAZE) injection: INTRAVENOUS | @ 16:00:00 | Stop: 2020-12-03

## 2020-12-03 MED ADMIN — bupivacaine (PF) (MARCAINE) 0.25 % (2.5 mg/mL) 30 mL, lidocaine (XYLOCAINE) 20 mg/mL (2 %) 20 mL: @ 16:00:00 | Stop: 2020-12-03

## 2020-12-03 MED ADMIN — atorvastatin (LIPITOR) tablet 40 mg: 40 mg | ORAL | @ 02:00:00

## 2020-12-03 MED ADMIN — heparin 25,000 Units/250 mL (100 units/mL) in 0.45% saline infusion (premade): 13 [IU]/kg/h | INTRAVENOUS | @ 23:00:00

## 2020-12-03 NOTE — Unmapped (Signed)
[facility]   Patient Onboarding/Medication Counseling    Colin Hunter is a 50 y.o. male with Chronic Myeloid Leukemia who I am counseling today on initiation of therapy.  I am speaking to the patient.    Was a Nurse, learning disability used for this call? No    Verified patient's date of birth / HIPAA.    Specialty medication(s) to be sent: Hematology/Oncology: Scemblix (asciminib)    Non-specialty medications/supplies to be sent: N/A    Medications not needed at this time: N/A     Scemblix (asciminib)    The patient declined counseling on missed dose instructions, goals of therapy, side effects and monitoring parameters, warnings and precautions, drug/food interactions and storage, handling precautions, and disposal because they were counseled in clinic. The information in the declined sections below are for informational purposes only and was not discussed with patient.     Medication & Administration     Dosage: Take 1 tablet (40 mg total) by mouth Two (2) times a day. Take on an empty stomach, at least 1 hour before or 2 hours after a meal. Swallow tablets whole. Do not break, crush, or chew the tablets.    CML Ph+chronic phase previously treated with ?? 2 tyrosine kinase inhibitors - 40mg  twice daily until treatment failure or unacceptable toxicity    Administration:   1. Administer on an empty stomach - avoid food for 2 hours before or 1 hours after taking dose.   2. If administering once daily, administer at approximately the same time each day  3. If administering twice daily, administer approximately every 12 hours.   4. Swallow tablets whole; do not break, crush, or chew  Adherence/Missed dose instructions:  1. Take a missed dose as soon as you think about it.  2. If you take this drug 1 time daily and it has been 12 hours or more since the missed dose, skip the missed dose and go back to your normal time.  3. If you take this drug 2 times daily and it has been 6 hours or more since the missed dose, skip the missed dose and go back to your normal time.  4. Do not take 2 doses at the same time or extra doses.    Goals of Therapy     Prevent disease progression    Side Effects & Monitoring Parameters   List common side effects:  ??? Hypertension (13%) - check BP daily and keep log.  Report consistently high reading to provider  ??? Skin rash, pruritis, urticaria (17%)  ??? GI issues:   ??? Diarrhea (12%)  ??? Nausea/vomiting (12%)  ??? Decreased appetite (<10%)  ??? Fatigue (17%)  ??? Headache (19%)  ??? Arm, back, leg, bone, neck, or stomach pain, muscle or joint pain (22%)  ??? Upper respiratory tract infection (26%)    The following side effects should be reported to the provider:  ??? Allergic reaction  ??? Signs of infection  ??? Signs of unusual bleeding or bruising  ??? Signs of pancreatic issues - bad stomach pain, very bad back pain, very back upset stomach  ??? Signs of electrolyte problems - mood changes, confusion, muscle pain/weakness, abnormal heartbeat  ??? Weakness on 1 side of the body, trouble speaking or thinking, change in balance, drooping on one side of the face, or blurred eyesight  ??? Chest pain or pressure  ??? Heart problems - heart failure, abnormal heartbeats, shortness of breath, a big weight gain, or swelling in arms/legs,  dizziness or passing out    Contraindications, Warnings, & Precautions     ??? Bone marrow suppression: Thrombocytopenia, neutropenia, and anemia have occurred with asciminib, including grade 3 and 4 events. The median time to first occurrence of grade 3 or 4 thrombocytopenia, neutropenia, or anemia was 6 weeks (range: 0.1 to 64 weeks), 6 weeks (range: 0.1 to 180 weeks), or 30 weeks (range: 0.4 to 207 weeks), respectively.  ??? Cardiovascular toxicity: Cardiovascular toxicity, including ischemic cardiac and CNS conditions, arterial thrombotic and embolic conditions, and heart failure, has been reported (including fatalities). Grade 3 and 4 cardiovascular toxicity has occurred, as well as grade 3 cardiac failure. Cardiovascular toxicity occurred in patients with preexisting cardiovascular conditions or risk factors, and/or prior exposure to multiple tyrosine kinase inhibitors. Arrhythmia (including QTc prolongation) has been reported, including grade 3 events.  ??? GI toxicity: Pancreatitis has been reported with asciminib, including grade 3 pancreatitis in a small number of patients. Asymptomatic lipase and amylase elevations occurred in one-fifth of patients; grade 3 and 4 pancreatic enzyme elevations also were reported.  ??? Hypersensitivity: Hypersensitivity reactions were reported in almost one-third of patients, including rare grade 3 or 4 events. Reactions included rash, edema, and bronchospasm.  ??? Hypertension: Hypertension occurred in almost one-fifth of patients receiving asciminib, including grade 3 and 4 hypertension. The median time to first occurrence of grade 3 or 4 hypertension was 14 weeks (range: 0.1 to 156 weeks).  ??? Patients who may become pregnant should use effective contraception during therapy and for 1 week after the last asciminib dose.  ??? Due to the potential for serious adverse reactions in the breastfed infant, breastfeeding is not recommended by the manufacturer during therapy and for 1 week after the last asciminib dose.  ??? Monitoring parameters:  ??? CBC every 2 weeks x 3 months and then monthly thereafter  ??? Serum lipase and amylase levels checked monthly  ??? Verify pregnancy status prior to starting therapy  Drug/Food Interactions     ??? Medication list reviewed in Epic. The patient was instructed to inform the care team before taking any new medications or supplements. No drug interactions identified.   ??? Vaccines (Inactivated): Immunosuppressants (Miscellaneous Oncologic Agents) may diminish the therapeutic effect of Vaccines (Inactivated). Management: Give inactivated vaccines at least 2 weeks prior to initiation of immunosuppressant when possible. Patients vaccinated less than 14 days before initiating or during therapy should be revaccinated after therapy is complete.   ??? Vaccines (Live): Immunosuppressants (Miscellaneous Oncologic Agents) may enhance the adverse/toxic effect of Vaccines (Live). Specifically, the risk of vaccine-associated infection may be increased. Immunosuppressants (Miscellaneous Oncologic Agents) may diminish the therapeutic effect of Vaccines (Live).   Storage, Handling Precautions, & Disposal   ??? Store in the original container at room temperature.  ??? Store in a dry place. Do not store in a bathroom.  ??? Keep all drugs in a safe place. Keep all drugs out of the reach of children and pets.  ??? Throw away unused or expired drugs. Do not flush down a toilet or pour down a drain unless you are told to do so. Check with your pharmacist if you have questions about the best way to throw out drugs. There may be drug take-back programs in your area.        Current Medications (including OTC/herbals), Comorbidities and Allergies     No current facility-administered medications for this visit.     Current Outpatient Medications   Medication Sig Dispense Refill   ??? asciminib (  SCEMBLIX) 40 mg tablet Take 1 tablet (40 mg total) by mouth Two (2) times a day. Take on an empty stomach, at least 1 hour before or 2 hours after a meal. Swallow tablets whole. Do not break, crush, or chew the tablets. 60 tablet 11     Facility-Administered Medications Ordered in Other Visits   Medication Dose Route Frequency Provider Last Rate Last Admin   ??? acetaminophen (TYLENOL) tablet 650 mg  650 mg Oral Q6H PRN Janann Colonel Power, MD   650 mg at 11/30/20 1127   ??? amLODIPine (NORVASC) tablet 10 mg  10 mg Oral Daily Janann Colonel Power, MD   10 mg at 12/03/20 0932   ??? atorvastatin (LIPITOR) tablet 40 mg  40 mg Oral Nightly Janann Colonel Power, MD   40 mg at 12/02/20 2151   ??? bupivacaine (PF) (MARCAINE) 0.25 % (2.5 mg/mL) 30 mL, lidocaine (XYLOCAINE) 20 mg/mL (2 %) 20 mL    PRN (once a day) Joretta Bachelor, MD   9 mL at 12/03/20 1140   ??? dextrose (D10W) 10% bolus 125 mL  12.5 g Intravenous Q10 Min PRN Janann Colonel Power, MD       ??? dextrose 50 % in water (D50W) 50 % solution 12.5 g  12.5 g Intravenous Q10 Min PRN Janann Colonel Power, MD       ??? fentaNYL (PF) (SUBLIMAZE) injection    PRN (once a day) Joretta Bachelor, MD   25 mcg at 12/03/20 1220   ??? glucagon injection 1 mg  1 mg Intramuscular Once PRN Janann Colonel Power, MD       ??? glucose chewable tablet 16 g  16 g Oral Q10 Min PRN Janann Colonel Power, MD       ??? heparin (porcine) 1000 unit/mL injection 5,000 Units  5,000 Units Intravenous Q6H PRN Pranati L Panuganti, MD       ??? heparin 25,000 Units/250 mL (100 units/mL) in 0.45% saline infusion (premade)  13 Units/kg/hr Intravenous Continuous Rudene Christians, MD   Stopped at 12/03/20 1035   ??? insulin glargine (LANTUS) injection 25 Units  25 Units Subcutaneous Nightly Rudene Christians, MD   25 Units at 12/02/20 2152   ??? insulin lispro (HumaLOG) injection 0-20 Units  0-20 Units Subcutaneous ACHS Janann Colonel Power, MD   1 Units at 12/02/20 1828   ??? insulin lispro (HumaLOG) injection 6 Units  6 Units Subcutaneous 3xd Meals Janann Colonel Power, MD   6 Units at 12/02/20 1828   ??? melatonin tablet 3 mg  3 mg Oral Nightly PRN Janann Colonel Power, MD       ??? metoprolol succinate (TOPROL-XL) 24 hr tablet 100 mg  100 mg Oral Daily Janann Colonel Power, MD   100 mg at 12/03/20 2841   ??? midazolam (VERSED) injection    PRN (once a day) Joretta Bachelor, MD   1 mg at 12/03/20 1220   ??? pantoprazole (PROTONIX) EC tablet 40 mg  40 mg Oral Daily Janann Colonel Power, MD   40 mg at 12/03/20 3244   ??? polyethylene glycol (MIRALAX) packet 17 g  17 g Oral Daily PRN Janann Colonel Power, MD       ??? pregabalin (LYRICA) capsule 75 mg  75 mg Oral BID Janann Colonel Power, MD   75 mg at 12/03/20 0102   ??? senna (SENOKOT) tablet 2 tablet  2 tablet Oral Nightly PRN Janann Colonel Power, MD  No Known Allergies    Patient Active Problem List   Diagnosis   ??? Atrial fibrillation (CMS-HCC)   ??? Atrial thrombus   ??? Chronic myeloid leukemia (CMS-HCC)   ??? CKD (chronic kidney disease) stage 3, GFR 30-59 ml/min (CMS-HCC)   ??? Congenital pulmonary valve stenosis   ??? Essential hypertension   ??? Gastroesophageal reflux disease   ??? Heart failure with reduced ejection fraction (CMS-HCC)   ??? PFO (patent foramen ovale)   ??? Type 2 diabetes mellitus treated with insulin (CMS-HCC)   ??? Syncope       Reviewed and up to date in Epic.    Appropriateness of Therapy     Acute infections noted within Epic:  No active infections  Patient reported infection: None    Is medication and dose appropriate based on diagnosis and infection status? Yes    Prescription has been clinically reviewed: Yes      Baseline Quality of Life Assessment      How many days over the past month did your CML - Chronic myeloid leukemia  keep you from your normal activities? For example, brushing your teeth or getting up in the morning. 0    Financial Information     Medication Assistance provided: None Required    Anticipated copay of $0/30 day supply reviewed with patient. Verified delivery address.    Delivery Information     Scheduled delivery date: 12/10/20    Expected start date: ASAP    Medication will be delivered via UPS to the prescription address in [facility].  This shipment will not require a signature.      Explained the services we provide at [facility] and that each month we would call to set up refills.  Stressed importance of returning phone calls so that we could ensure they receive their medications in time each month.  Informed patient that we should be setting up refills 7-10 days prior to when they will run out of medication.  A pharmacist will reach out to perform a clinical assessment periodically.  Informed patient that a welcome packet, containing information about our pharmacy and other support services, a Notice of Privacy Practices, and a drug information handout will be sent.      The patient or caregiver noted above participated in the development of this care plan and knows that they can request review of or adjustments to the care plan at any time.      Patient or caregiver verbalized understanding of the above information as well as how to contact the pharmacy at [phone] option 4 with any questions/concerns.  The pharmacy is open Monday through Friday 8:30am-4:30pm.  A pharmacist is available 24/7 via pager to answer any clinical questions they may have.    Patient Specific Needs     - Does the patient have any physical, cognitive, or cultural barriers? No    - Does the patient have adequate living arrangements? (i.e. the ability to store and take their medication appropriately) Yes    - Did you identify any home environmental safety or security hazards? No    - Patient prefers to have medications discussed with  Patient     - Is the patient or caregiver able to read and understand education materials at a high school level or above? Yes    - Patient's primary language is  English     - Is the patient high risk? Yes, patient is taking oral chemotherapy. Appropriateness of  therapy as been assessed    - Does the patient require physician intervention or other additional services (i.e. dietary/nutrition, smoking cessation, social work)? No      Sonali A Shari [facility] Specialty Pharmacist

## 2020-12-03 NOTE — Unmapped (Signed)
Cardiology - Team 1 [facility]) Progress Note    Assessment & Plan:   Colin Hunter is a 50 y.o. 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    Principal Problem:    Syncope  Active Problems:    Atrial fibrillation (CMS-HCC)    Chronic myeloid leukemia (CMS-HCC)    CKD (chronic kidney disease) stage 3, GFR 30-59 ml/min (CMS-HCC)    Congenital pulmonary valve stenosis    Gastroesophageal reflux disease    Heart failure with reduced ejection fraction (CMS-HCC)    Type 2 diabetes mellitus treated with insulin (CMS-HCC)  Resolved Problems:    * No resolved hospital problems. *    Recurrent Syncopal and Pre-Syncopal Episodes:??Admitted for recurrent pre/syncopal events likely in the setting of Afib/Aflutter and HFrEF/pulmonary insufficiency as below. No further events during hospitalization. Negative workup including repeat CT head, MRI head, repeat carotid dopplers, negative orthostatics, and no evidence of seizures on EEG. Suspect these events are most likely 2/2 to right sided HF with pulmonic insufficiency leading to volume overload superimposed on Afib/Aflutter. Now optimized from a volume status and plan for Ablation today.   - Ablation today for definitive treatment of a-flutter  - Management of HFrEF and A-fib per below  ??  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 Echo in 11/2020 with EF 40%, Moderate RV systolic dysfunction. Presented with volume overload now euvolemic with IV diuresis.RHC 8/23: RA  mean 13 mm Hg, RV 53/3, 13, PA 48/5 mean 22, PCWP mean 13. Dry weight 244 lbs, 282 lbs on admission, now down to ~260 lbs.    - Diuresis plan: cont to hold diuresis given euvolemic exam and improving Cr  - BMP q12   - Home metoprolol at half dose succinate 100mg  daily  - Hold home Losartan 25 mg daily given AKI and hyperkalemia  - Hold home Toresemide 20 mg BID   - Will need workup for pulmonic valve replacement (cardiac MRI and TAVR CT) with CT Surgery completed outpatient    Atrial Fibrillation/Flutter: EKG on admission with A Flutter 4:1 but rate controlled. On Eliquis and Metoprolol at home. History of DCCV in 12/2019 with improvement in volume status at the time. Repeat ablation today with hope this will keep his volume status under control while workup underway for pulmonic valve replacement.   - Continue home metoprolol??  - Ablation today  - On heparin gtt; restart Eliquis tomorrow     AKI - NAGMA: In setting of diuresis; holding for now  - Daily BMP  ??  HTN: Well controlled; not taking home hydral and imdur   - Continue home Amlodipine??and metoprolol??  ??  CML - Iron Deficiency Anemia: Ferritin 67.3, Iron 33, and Iron Sat 6% suggesting iron deficiency anemia. Also with history of CML. Previously on Bosutinib but unclear if patient has been taking this??recently.??Heme/onc consulted, appreciated recs.  - Discussed??Bosutinib with Heme/Onc, recommend holding it while inpatient   - Heme onc plans to transition to Asciminib 40mg  bid as outpatient  - s/p IV iron repletion   ??  T2DM - ASCVD Risk: Uncontrolled, A1c >14.??Home meds include SSI??with meals, Lantus 32 units nightly, Ozempic 0.25mg  weekly,   -??continue??SSI  - Lantus??25??units nightly  -  6 units of Lispro TID with meals   - Sub Atorvastatin for home Crestor  - Holding Losartan as above    Daily Checklist:  Diet: Regular diet, 2g Na restriction, 2L fluid restriction  DVT PPx: Patient Already on Full Anticoagulation with Eliquis  Electrolytes: Replete Potassium to >/=4 and Magnesium to >/=2  Code Status: Full Code    Team Contact Information:   Primary Team: Cardiology - Team 1 ([facility])  Primary Resident: Marca Ancona, MD  Resident's Pager: [phone] (Cardiology Team 1 Intern)    Interval History:   No acute events overnight.     Feeling well today and looking forward to ablation    ROS: Denies headache, chest pain, shortness of breath, abdominal pain, nausea, vomiting.    Objective:   Temp:  [36.4 ??C (97.5 ??F)-36.6 ??C (97.9 ??F)] 36.6 ??C (97.9 ??F)  Heart Rate:  [62-101] 84  SpO2 Pulse:  [62-100] 84  Resp:  [10-22] 18  BP: (115-156)/(62-94) 133/84  SpO2:  [91 %-100 %] 97 %    Gen: WDWN in NAD, answers questions appropriately  Eyes: sclera anicteric, EOMI  HENT: atraumatic, MMM, OP w/o erythema or exudate   Heart: RRR, S1, S2, no M/R/G, no chest wall tenderness  Lungs: CTAB, no crackles or wheezes, no use of accessory muscles  Abdomen: Normoactive bowel sounds, soft, non tended, decreased abdominal girth/swelling, no rebound/guarding  Extremities: no clubbing, cyanosis, trace pitting edema in lower extremities improved from prior  Psych: Alert, oriented, appropriate mood and affect    Labs/Studies: Labs and Studies from the last 24hrs per EMR and Reviewed

## 2020-12-03 NOTE — Unmapped (Signed)
Pt free of falls. VSS. Cardiac meds provided. I/O monitored. Bg monitored and insulin provided. Heparin gtt started. PVL completed. Ambulates in room independently. Tele and cont pule ox in place. No CP  Problem: Adult Inpatient Plan of Care  Goal: Plan of Care Review  Outcome: Progressing  Goal: Patient-Specific Goal (Individualized)  Outcome: Progressing  Goal: Absence of Hospital-Acquired Illness or Injury  Outcome: Progressing  Intervention: Identify and Manage Fall Risk  Recent Flowsheet Documentation  Taken 12/02/2020 0800 by Janett Labella, RN  Safety Interventions:   low bed   fall reduction program maintained  Intervention: Prevent Skin Injury  Recent Flowsheet Documentation  Taken 12/02/2020 0800 by Janett Labella, RN  Skin Protection: adhesive use limited  Intervention: Prevent and Manage VTE (Venous Thromboembolism) Risk  Recent Flowsheet Documentation  Taken 12/02/2020 0830 by Janett Labella, RN  VTE Prevention/Management: anticoagulant therapy  Goal: Optimal Comfort and Wellbeing  Outcome: Progressing  Goal: Readiness for Transition of Care  Outcome: Progressing  Goal: Rounds/Family Conference  Outcome: Progressing     Problem: Cardiac Output Decreased (Heart Failure)  Goal: Optimal Cardiac Output  Outcome: Progressing     Problem: Diabetes Comorbidity  Goal: Blood Glucose Level Within Targeted Range  Outcome: Progressing  Intervention: Monitor and Manage Glycemia  Recent Flowsheet Documentation  Taken 12/02/2020 0800 by Janett Labella, RN  Glycemic Management: blood glucose monitored     Problem: Dysrhythmia (Heart Failure)  Goal: Stable Heart Rate and Rhythm  Outcome: Progressing     Problem: Chest Pain  Goal: Resolution of Chest Pain Symptoms  Outcome: Progressing

## 2020-12-03 NOTE — Unmapped (Signed)
Patient A&Ox4,VSS. Patient denied chest pain, lightheadness, dizziness. Pt went for CT head during shift. Pt on heparin gtt at 13units and tolerating. Pt NPO at midnight for ablation. Blood glucose controlled per order. Patient was free from falls, call bell and bedside table within reach.   Problem: Adult Inpatient Plan of Care  Goal: Plan of Care Review  Outcome: Progressing  Goal: Patient-Specific Goal (Individualized)  Outcome: Progressing  Goal: Absence of Hospital-Acquired Illness or Injury  Outcome: Progressing  Intervention: Prevent and Manage VTE (Venous Thromboembolism) Risk  Recent Flowsheet Documentation  Taken 12/02/2020 2157 by Ananias Pilgrim, RN  VTE Prevention/Management: anticoagulant therapy  Goal: Optimal Comfort and Wellbeing  Outcome: Progressing  Goal: Readiness for Transition of Care  Outcome: Progressing  Goal: Rounds/Family Conference  Outcome: Progressing     Problem: Cardiac Output Decreased (Heart Failure)  Goal: Optimal Cardiac Output  Outcome: Progressing     Problem: Dysrhythmia (Heart Failure)  Goal: Stable Heart Rate and Rhythm  Outcome: Progressing     Problem: Fall Injury Risk  Goal: Absence of Fall and Fall-Related Injury  Outcome: Progressing     Problem: Diabetes Comorbidity  Goal: Blood Glucose Level Within Targeted Range  Outcome: Progressing     Problem: Chest Pain  Goal: Resolution of Chest Pain Symptoms  Outcome: Progressing

## 2020-12-04 DIAGNOSIS — C9210 Chronic myeloid leukemia, BCR/ABL-positive, not having achieved remission: Principal | ICD-10-CM

## 2020-12-04 LAB — BASIC METABOLIC PANEL
ANION GAP: 7 mmol/L (ref 5–14)
BLOOD UREA NITROGEN: 51 mg/dL — ABNORMAL HIGH (ref 9–23)
BUN / CREAT RATIO: 19
CALCIUM: 8.9 mg/dL (ref 8.7–10.4)
CHLORIDE: 108 mmol/L — ABNORMAL HIGH (ref 98–107)
CO2: 20 mmol/L (ref 20.0–31.0)
CREATININE: 2.68 mg/dL — ABNORMAL HIGH
EGFR CKD-EPI (2021) MALE: 28 mL/min/{1.73_m2} — ABNORMAL LOW (ref >=60)
GLUCOSE RANDOM: 199 mg/dL — ABNORMAL HIGH (ref 70–179)
POTASSIUM: 4.3 mmol/L (ref 3.4–4.8)
SODIUM: 135 mmol/L (ref 135–145)

## 2020-12-04 LAB — APTT
APTT: 74 s — ABNORMAL HIGH (ref 24.9–36.9)
APTT: 83.3 s — ABNORMAL HIGH (ref 24.9–36.9)
HEPARIN CORRELATION: 0.4
HEPARIN CORRELATION: 0.5

## 2020-12-04 LAB — CBC
HEMATOCRIT: 36.4 % — ABNORMAL LOW (ref 39.0–48.0)
HEMOGLOBIN: 11.9 g/dL — ABNORMAL LOW (ref 12.9–16.5)
MEAN CORPUSCULAR HEMOGLOBIN CONC: 32.7 g/dL (ref 32.0–36.0)
MEAN CORPUSCULAR HEMOGLOBIN: 27.2 pg (ref 25.9–32.4)
MEAN CORPUSCULAR VOLUME: 83.1 fL (ref 77.6–95.7)
MEAN PLATELET VOLUME: 9.5 fL (ref 6.8–10.7)
PLATELET COUNT: 129 10*9/L — ABNORMAL LOW (ref 150–450)
RED BLOOD CELL COUNT: 4.38 10*12/L (ref 4.26–5.60)
RED CELL DISTRIBUTION WIDTH: 16.4 % — ABNORMAL HIGH (ref 12.2–15.2)
WBC ADJUSTED: 11.5 10*9/L — ABNORMAL HIGH (ref 3.6–11.2)

## 2020-12-04 LAB — MAGNESIUM: MAGNESIUM: 1.9 mg/dL (ref 1.6–2.6)

## 2020-12-04 MED ORDER — METOPROLOL SUCCINATE ER 100 MG TABLET,EXTENDED RELEASE 24 HR
ORAL_TABLET | Freq: Every day | ORAL | 1 refills | 30 days | Status: CP
Start: 2020-12-04 — End: ?

## 2020-12-04 MED ADMIN — atorvastatin (LIPITOR) tablet 40 mg: 40 mg | ORAL | @ 01:00:00

## 2020-12-04 MED ADMIN — pregabalin (LYRICA) capsule 75 mg: 75 mg | ORAL | @ 13:00:00 | Stop: 2020-12-04

## 2020-12-04 MED ADMIN — insulin lispro (HumaLOG) injection 0-20 Units: 0-20 [IU] | SUBCUTANEOUS | @ 14:00:00 | Stop: 2020-12-04

## 2020-12-04 MED ADMIN — apixaban (ELIQUIS) tablet 5 mg: 5 mg | ORAL | @ 14:00:00 | Stop: 2020-12-04

## 2020-12-04 MED ADMIN — acetaminophen (TYLENOL) tablet 650 mg: 650 mg | ORAL | @ 09:00:00 | Stop: 2020-12-04

## 2020-12-04 MED ADMIN — metoprolol succinate (TOPROL-XL) 24 hr tablet 100 mg: 100 mg | ORAL | @ 13:00:00 | Stop: 2020-12-04

## 2020-12-04 MED ADMIN — insulin glargine (LANTUS) injection 25 Units: 25 [IU] | SUBCUTANEOUS | @ 01:00:00

## 2020-12-04 MED ADMIN — insulin lispro (HumaLOG) injection 6 Units: 6 [IU] | SUBCUTANEOUS | @ 14:00:00 | Stop: 2020-12-04

## 2020-12-04 MED ADMIN — amLODIPine (NORVASC) tablet 10 mg: 10 mg | ORAL | @ 13:00:00 | Stop: 2020-12-04

## 2020-12-04 MED ADMIN — pantoprazole (PROTONIX) EC tablet 40 mg: 40 mg | ORAL | @ 13:00:00 | Stop: 2020-12-04

## 2020-12-04 MED ADMIN — pregabalin (LYRICA) capsule 75 mg: 75 mg | ORAL | @ 01:00:00

## 2020-12-04 NOTE — Unmapped (Signed)
Pt has been alert and oriented X4. VSS; denied having pain. BG has been monitored; no SS coverage was given. C/o shoulder pain; tylenol given with good effect.  Heparin gtt infusing at 13 unit/kg/hr. 400 ml urine output for the shift. Bed locked in the lowest position with two side rails up. Pt has been free from fall/injury; will continue to monitor.  Problem: Adult Inpatient Plan of Care  Goal: Plan of Care Review  Outcome: Progressing  Goal: Patient-Specific Goal (Individualized)  Outcome: Progressing  Goal: Absence of Hospital-Acquired Illness or Injury  Outcome: Progressing  Intervention: Identify and Manage Fall Risk  Recent Flowsheet Documentation  Taken 12/03/2020 1939 by Leisa Lenz, RN  Safety Interventions:   low bed   fall reduction program maintained  Intervention: Prevent and Manage VTE (Venous Thromboembolism) Risk  Recent Flowsheet Documentation  Taken 12/03/2020 2028 by Leisa Lenz, RN  VTE Prevention/Management: anticoagulant therapy  Taken 12/03/2020 2000 by Leisa Lenz, RN  Activity Management: up ad lib  Taken 12/03/2020 1939 by Leisa Lenz, RN  Activity Management: up ad lib  Goal: Optimal Comfort and Wellbeing  Outcome: Progressing  Goal: Readiness for Transition of Care  Outcome: Progressing  Goal: Rounds/Family Conference  Outcome: Progressing     Problem: Cardiac Output Decreased (Heart Failure)  Goal: Optimal Cardiac Output  Outcome: Progressing     Problem: Dysrhythmia (Heart Failure)  Goal: Stable Heart Rate and Rhythm  Outcome: Progressing     Problem: Fall Injury Risk  Goal: Absence of Fall and Fall-Related Injury  Outcome: Progressing  Intervention: Promote Injury-Free Environment  Recent Flowsheet Documentation  Taken 12/03/2020 1939 by Leisa Lenz, RN  Safety Interventions:   low bed   fall reduction program maintained     Problem: Diabetes Comorbidity  Goal: Blood Glucose Level Within Targeted Range  Outcome: Progressing     Problem: Chest Pain  Goal: Resolution of Chest Pain Symptoms  Outcome: Progressing

## 2020-12-04 NOTE — Unmapped (Signed)
Cardiology Discharge Summary    Identifying Information:  Colin Hunter  1970-11-11  [id number]     Admit Date: 11/25/2020  Discharge Date: 12/04/2020   Discharge To: Home  Discharge Service: Cardiology [facility])  Discharge Attending Physician: Charna Archer, MD  Primary Care Physician: Salem Caster, MD     Discharge Diagnoses:    Principal Problem:    Syncope  Active Problems:    Atrial fibrillation (CMS-HCC)    Chronic myeloid leukemia (CMS-HCC)    CKD (chronic kidney disease) stage 3, GFR 30-59 ml/min (CMS-HCC)    Congenital pulmonary valve stenosis    Gastroesophageal reflux disease    Heart failure with reduced ejection fraction (CMS-HCC)    Type 2 diabetes mellitus treated with insulin (CMS-HCC)    Principal Diagnosis:  HFrEF, Acute on Chronic    Secondary Diagnoses:  HFrEF, Acute on Chronic  Pulmonic insufficiency with RV systolic dysfunction  Atrial Flutter  Hypertension  Diabetes Mellitus Type 2  AKI on CKD  CML    Outpatient Provider Follow Up Issues:  [ ]  Consider adding back home Losartan depending on kidney funciton  [ ]  Consider going back to home Metoprolol dose of 200mg  daily if necessary  [ ]  Outpatient workup of pulmonic valve repair (transvalvular vs CT surgery)  [ ]  PCP to follow up diabetes management     Hospital Course:  Colin Hunter is a 50 y.o. 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 His hospital course is outlined by problem below.    Recurrent Syncopal and Pre-Syncopal Episodes: Patient with several hospitalizations for similar chief complaint ([facility], [facility]). Admitted for recurrent pre/syncopal events likely in the setting of Afib/Aflutter and HFrEF/pulmonary insufficiency as below. No further events during hospitalization. Negative workup including repeat CT head, MRI head, repeat carotid dopplers, negative orthostatics, and no evidence of seizures on EEG. Suspect these events are most likely 2/2 to right sided HF with pulmonic insufficiency leading to volume overload superimposed on Afib/Aflutter. As he presented with volume overload, we aggressively diuresed him to optimize his volume status. Right heart cath revealed RA  mean 13 mm Hg, RV 53/3, 13, PA 48/5 mean 22, PCWP mean 13. He then underwent successful ablation with conversion to sinus rhythm on 8/25. Definitive management of pulmonic insufficiency will be done in the outpatient setting (transvalvular vs CT surgery).    HFrEF (EF 40% with RV Systolic Dysfunction) - Volume Overload: History of congenital pulmonic stenosis. Echo in 11/2020 with EF 40%, Moderate RV systolic dysfunction. GDMT at Home: Torsemide 20mg  BID, Metoprolol Succinate 200mg  daily, Losartan 25mg  daily. Endorses ~40lb weight gain (dry weight reportedly 244lbs, 282lbs on admission), increasing LE edema prior to admission. BNP 118. JVP elevated on exam, with non-pitting edema in LE bilaterally. He was aggressively diuresed with lasix 80mg  IV BID until euvolemic. On discharge we transitioned back to oral Torsemide 20mg  BID, and have held losartan given AKI. Right heart cath revealed RA  mean 13 mm Hg, RV 53/3, 13, PA 48/5 mean 22, PCWP mean 13. Definitive management of pulmonic insufficiency will be done in the outpatient setting (transvalvular vs CT surgery).     Atrial Fibrillation/Flutter: EKG on admission with A Flutter 4:1 but rate controlled. On Eliquis and Metoprolol at home. Resumed beta blockade with metoprolol at  half home dose. History of DCCV in 12/2019 with unclear length of time he remained in NSR. Plan was for ablation as an outpatient but was successfully ablated into NSR on 8/25. Will discharge on metoprolol 100mg  daily with outpatient follow up to determine increasing back to 200mg  if necessary. Continue home eliquis 5mg  BID.     AKI - NAGMA  Cr 2.17 most recently. On admission elevated to 3.70 -> 2.68. Suspect likely 2/2 Cardiorenal. Bicarb 18 on admission with normal pH on VBG, which could be in setting of AKI vs renal tubular acidosis given normal anion gap. Lactate normal. 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 Diuresed as above which was complicated in setting of AKI. Cr Improved to 2.68 on discharge.     HTN: Home meds unclear, patient not always taking consistently and did not know what he was taking at home. Hold hydralazine and Imdur since unclear if patient taking at home. Continue home Amlodipine and metoprolol     CML - Iron Deficiency Anemia: Ferritin 67.3, Iron 33, and Iron Sat 6% suggesting iron deficiency anemia, repleated with IV iron inpatient. Also with history of CML. Previously on Bosutinib but feels this has worsened his volume overload. Heme/onc consulted, appreciated recs. Discussed Bosutinib with Heme/Onc, recommend holding it while inpatient and on discharge. Heme onc plans to transition to Asciminib 40mg  bid as outpatient.     T2DM - ASCVD Risk: Uncontrolled, A1c >14. Home meds include SSI with meals, Lantus 32 units nightly, Ozempic 0.25mg  weekly which we continued on discharge. Continue Crestor (subbed atorvastatin while inpatient).    Procedures:  Right heart catheterization, A-Flutter ablation  No admission procedures for hospital encounter.    Discharge Day Services:  BP 133/82  - Pulse 84  - Temp 36.7 ??C (98.1 ??F) (Oral)  - Resp 20  - Ht 188 cm (6' 2)  - Wt (!) 120.2 kg (265 lb 1.7 oz)  - SpO2 95%  - BMI 34.04 kg/m??   Pt seen on the day of discharge and determined appropriate for discharge.    Condition at Discharge: good    Discharge Medications:     Your Medication List      STOP taking these medications    hydrALAZINE 25 MG tablet  Commonly known as: APRESOLINE     isosorbide dinitrate 20 MG tablet  Commonly known as: ISORDIL     losartan 25 MG tablet  Commonly known as: COZAAR        CHANGE how you take these medications    metoprolol succinate 100 MG 24 hr tablet  Commonly known as: TOPROL-XL  Take 1 tablet (100 mg total) by mouth daily.  What changed:   ?? medication strength  ?? how much to take        CONTINUE taking these medications    acetaminophen 325 MG tablet  Commonly known as: TYLENOL  Take 650 mg by mouth every six (6) hours as needed for pain.     allopurinoL 100 MG tablet  Commonly known as: ZYLOPRIM  Take 100 mg by mouth daily.     amLODIPine 10 MG tablet  Commonly known as: NORVASC  Take 10 mg by mouth daily.     apixaban 5 mg Tab  Commonly known as: ELIQUIS  Take 5 mg by mouth Two (2) times a day.  BINAXNOW COVID-19 AG SELF TEST Kit  Generic drug: COVID-19 antigen test  Use as Directed on the Package     blood sugar diagnostic Strp  Twice daily testing for 30 days     ONETOUCH VERIO TEST STRIPS Strp  Generic drug: blood sugar diagnostic  USE 1 STRIP TO CHECK GLUCOSE TWICE DAILY     blood-glucose meter Misc  USE AS DIRECTED     folic acid 1 MG tablet  Commonly known as: FOLVITE  Take 1 mg by mouth daily.     insulin ASPART 100 unit/mL (3 mL) injection pen  Commonly known as: NovoLOG FLEXPEN  Inject 0.15 mL (15 Units total) under the skin Three (3) times a day before meals. Maximum 50u per a day     insulin glargine 100 unit/mL (3 mL) injection pen  Commonly known as: BASAGLAR, LANTUS  Inject 0.32 mL (32 Units total) under the skin nightly.     insulin syringe-needle U-100 0.5 mL 29 gauge x 1/2 (12 mm) Syrg  Use to inject 4 times daily as directed.     lancets Misc  by Miscellaneous route.     OZEMPIC 0.25 mg or 0.5 mg(2 mg/1.5 mL) Pnij injection  Generic drug: semaglutide  Inject 0.25 mg under the skin every seven (7) days.     pantoprazole 40 MG tablet  Commonly known as: PROTONIX  Take 40 mg by mouth daily.     pen needle, diabetic 29 gauge x 1/2 (12 mm) Ndle  Please substitute for cheapest generic available. Diagnosis E11.9.Z79.4     pregabalin 150 MG capsule  Commonly known as: LYRICA  Take 150 mg by mouth Three (3) times a day.     rosuvastatin 20 MG tablet  Commonly known as: CRESTOR  Take 20 mg by mouth daily.     torsemide 20 MG tablet  Commonly known as: DEMADEX  Take 20 mg by mouth two (2) times a day. As needed            Recent Labs:  Microbiology Results (last day)     ** No results found for the last 24 hours. **        Lab Results   Component Value Date    WBC 11.5 (H) 12/04/2020    HGB 11.9 (L) 12/04/2020    HCT 36.4 (L) 12/04/2020    PLT 129 (L) 12/04/2020     Lab Results   Component Value Date    NA 135 12/04/2020    K 4.3 12/04/2020    CL 108 (H) 12/04/2020    CO2 20.0 12/04/2020    BUN 51 (H) 12/04/2020    CREATININE 2.68 (H) 12/04/2020    CALCIUM 8.9 12/04/2020    MG 1.9 12/04/2020    PHOS 2.7 11/11/2020     Lab Results   Component Value Date    ALKPHOS 344 (H) 11/24/2020    BILITOT 0.9 11/24/2020    BILIDIR <0.10 06/13/2020    PROT 8.0 11/24/2020    ALBUMIN 4.1 11/24/2020    ALT 23 11/24/2020    AST 23 11/24/2020     Lab Results   Component Value Date    PT 13.3 11/12/2020    INR 1.14 11/12/2020    APTT 83.3 (H) 12/04/2020     Radiology:  ECG 12 Lead    Result Date: 12/04/2020  NORMAL SINUS RHYTHM RIGHT BUNDLE BRANCH BLOCK INFERIOR INFARCT (CITED ON OR BEFORE 26-Aug-2019) ANTERIOR INFARCT  (CITED ON OR BEFORE 26-Aug-2019)  ABNORMAL ECG WHEN COMPARED WITH ECG OF 24-Nov-2020 13:55, SINUS RHYTHM HAS REPLACED ATRIAL FLUTTER QUESTIONABLE CHANGE IN INITIAL FORCES OF SEPTAL LEADS    CT Head Wo Contrast    Result Date: 12/02/2020  EXAM: Computed tomography, head or brain without contrast material. DATE: 12/02/2020 9:28 PM ACCESSION: [id number] UN DICTATED: 12/02/2020 9:32 PM INTERPRETATION LOCATION: [facility] CLINICAL INDICATION: 50 years old Male with syncope/pre-syncope  COMPARISON: None TECHNIQUE: Axial CT images of the head  from skull base to vertex without contrast. FINDINGS: There is no midline shift. No mass lesion. There is no evidence of acute infarct. No acute intracranial hemorrhage. No fractures are evident. Carotid siphon calcifications. Mucous retention cyst in the left maxillary sinus. Occipital scalp hematoma.     No acute intracranial abnormality.    Echocardiogram Transesophageal Echo    Result Date: 12/03/2020  Patient Info Name:     Colin Hunter Age:     72 years DOB:     Jul 10, 1970 Gender:     Male MRN:     [id number] Accession #:     [id number] UN Ht:     188 cm Wt:     118 kg BSA:     2.52 m2 Technical Quality:     Fair Exam Date:     12/03/2020 11:15 AM Exam Location:     [facility] Admit Date:     11/25/2020 Exam Type:     ECHOCARDIOGRAM TRANSESOPHAGEAL ECHO Study Info Indications      - Pre-flutter ablation Complete two-dimensional, color flow and Doppler transesophageal study is performed. Staff Referring Physician:     [phone], Allyne Gee; Reading Fellow:     Cheryll Dessert MD Sonographer:     Edwena Bunde Ordering Physician:     Damien Fusi  [phone]) Account #:     [id number] Summary   1. There is no thrombus seen in the left atrium/left atrial appendage.   2. The right ventricle is moderately to severely dilated in size, with severely reduced systolic function.   3. There is moderate tricuspid regurgitation.   4. S/p pulmonic valvotomy in childhood for PS, partially retained pulmonic leaflet observed. Severe pulmonic insufficiency noted.   5. Interatrial communication with bidirectional shunting, rightward shunting however is more pronounced than leftward shunting. Anesthesia/Analgesia   The patient received moderate sedation.   The patient was premedicated with 4.00 mg intravenous Midazolam.   The patient was premedicated with 100.00 mcg intravenous Fentanyl. Procedure Details   There was minimal difficulty inserting the probe. Number of insertion attempts: 2.   The probe was inserted by the cardiologist.   The patient arrived in a fasting state after obtaining informed consent. The transesophageal probe was passed into the posterior pharynx, mid-esophagus, and distal esophagus. Imaging was performed at multiple levels. The patient tolerated the procedure well and there were no complications. Right Ventricle   The right ventricle is moderately to severely dilated in size, with severely reduced systolic function. Left Atrium   There is no thrombus seen in the left atrium/left atrial appendage.   There is no thrombus seen in the left atrium. Aortic Valve   The aortic valve is trileaflet with normal appearing leaflets with normal excursion.   There is no significant aortic regurgitation. Pulmonic Valve   S/p pulmonic valvotomy in childhood for PS, partially retained pulmonic leaflet observed. Severe pulmonic insufficiency noted. Mitral Valve   The mitral valve leaflets are normal with normal leaflet mobility.   There is trivial mitral valve regurgitation. Tricuspid Valve  The tricuspid valve leaflets are normal, with normal leaflet mobility.   There is moderate tricuspid regurgitation. Report Signatures Resident Cheryll Dessert  MD on 12/03/2020 04:03 PM    Echocardiogram Follow Up/Limited Echo    Result Date: 11/28/2020  Patient Info Name:     Colin Hunter Age:     25 years DOB:     September 10, 1970 Gender:     Male MRN:     [id number] Accession #:     [id number] UN Ht:     188 cm Wt:     120 kg BSA:     2.54 m2 BP:     130 /     81 mmHg Technical Quality:     Fair Exam Date:     11/26/2020 12:20 PM Site Location:     [facility] Exam Location:     [facility] Admit Date:     11/25/2020 Exam Type:     ECHOCARDIOGRAM FOLLOW UP/LIMITED ECHO Study Info Indications      - HFrEF Limited 2D, color flow and Doppler transthoracic echocardiogram is performed. Staff Referring Physician:     [phone], Aletha Halim; Reading Fellow:     Cheryll Dessert MD Sonographer: Earnest Conroy RDCS, RVT Ordering Physician:     Coralee Pesa Account #:     [id number] Summary   1. The left ventricle is normal in size with normal wall thickness.   2. The left ventricular systolic function is mildly decreased, LVEF is visually estimated at 45%.   3. The left atrium is mildly dilated in size.   4. The right ventricle is severely dilated in size, with moderately reduced systolic function.   5. The right atrium is moderately dilated  in size.   6. Limited study to assess ventricular function. Left Ventricle   The left ventricle is normal in size with normal wall thickness.   The left ventricular systolic function is mildly decreased, LVEF is visually estimated at 45%.   There is grade I diastolic dysfunction (impaired relaxation). Right Ventricle   The right ventricle is severely dilated in size, with moderately reduced systolic function. Ventricular Septum   There is abnormal ventricular septal motion consistent with RV pressure and volume overload.. Left Atrium   The left atrium is mildly dilated in size. Right Atrium   The right atrium is moderately dilated  in size. Aortic Valve   The aortic valve is probably trileaflet with normal appearing leaflets with normal excursion. Mitral Valve   The mitral valve leaflets are normal with normal leaflet mobility. Tricuspid Valve   The tricuspid valve leaflets are normal, with normal leaflet mobility.   There is moderate tricuspid regurgitation.   The right ventricular systolic pressure is mildly  elevated.   TR maximum velocity: 2.8 m/s. Pericardium/Pleural   There is no pericardial effusion. Inferior Vena Cava   The IVC is not well visualized precluding the ability to accurate assess right atrial pressure. Mitral Valve ---------------------------------------------------------------------- Name                                 Value        Normal ---------------------------------------------------------------------- MV Diastolic Function ---------------------------------------------------------------------- MV E Peak Velocity                110 cm/s               MV Annular TDI ---------------------------------------------------------------------- MV Septal e' Velocity  8.3 cm/s         >=8.0 MV Lateral e' Velocity           12.5 cm/s        >=10.0 MV e' Average                         10.4               MV E/e' (Average)                     11.1 Tricuspid Valve ---------------------------------------------------------------------- Name                                 Value        Normal ---------------------------------------------------------------------- TV Regurgitation Doppler ---------------------------------------------------------------------- TR Peak Velocity                     3 m/s Ventricles ---------------------------------------------------------------------- Name                                 Value        Normal ---------------------------------------------------------------------- LV Dimensions 2D/MM ---------------------------------------------------------------------- IVS Diastolic Thickness (2D)        1.1 cm       0.6-1.0 LVID Diastole (2D)                  4.2 cm       4.2-5.8 LVPW Diastolic Thickness (2D)                                1.1 cm       0.6-1.0 LVID Systole (2D)                   2.9 cm       2.5-4.0 RV Dimensions 2D/MM ---------------------------------------------------------------------- RV Basal Diastolic Dimension        7.1 cm       2.5-4.1 TAPSE                               1.8 cm         >=1.7 Atria ---------------------------------------------------------------------- Name                                 Value        Normal ---------------------------------------------------------------------- LA Dimensions ---------------------------------------------------------------------- LA Dimension (2D)                   5.3 cm       3.0-4.1 RA Dimensions ---------------------------------------------------------------------- RA Area (4C)                      29.5 cm2        <=18.0 RA Area (4C) Index             11.6 cm2/m2 Report Signatures Finalized by Andrey Campanile  MD on 11/28/2020 08:01 AM Resident Cheryll Dessert  MD on 11/26/2020 04:41 PM    PVL Carotid Duplex Bilateral    Result Date: 12/02/2020    Peripheral Vascular Lab     [address]   [place],  [place] [address]  PVL CAROTID DUPLEX BILATERAL Patient Demographics Pt. Name: Colin Hunter Location: PVL Inpatient Lab MRN:      [id number]    Sex:      M DOB:      1970/12/27       Age:      21 years  Study Information Authorizing         [id number] Janann Colonel Gael Londo Performed Time       12/02/2020 2:25:32 Provider Name                                                  PM Ordering Physician  Izyk Marty, [facility]         Patient Location     [facility] Accession Number    [id number] UN         Technologist         Tanja Port RVT Diagnosis:                                Assisting                                           Technologist Ordered Reason For Exam: Pre-syncope/syncope  Other Indication: Sycope. Risk Factors:     Non insulin dependent diabetes mellitus. Other Factors:    A Fib, heart failure.  Examination Protocol: The extracranial carotid systems are interrogated from beneath the clavicle to the angle of the mandible. The vertebral arteries are assessed in the mid cervical region and are traced to the origin when possible. The subclavian arteries are assessed proximally. Using ACAS and NASCET methodology, Doppler velocities are used to classify stenosis according to criteria developed and validated at [facility]. Image is used to subdivide class 1.  ICA Class Systolic Velocity (Vp) Diastolic Velocity (Vd)    Category     1           <160 cm/s               <80 cm/s             0-39 %     2           >160 cm/s               <80 cm/s             40-59%     3           >160 cm/s              80-109 cm/s possibly > 60%     4           >160 cm/s               >110 cm/s            60-99%     5             0 cm/s                 0 cm/s             occluded     6  see text               see text            see text Vessel (velocities in cm/s) RIGHT      PSV EDV Class CCA prox   167 16 CCA mid    87  10 CCA dist   72  17 ICA prox   35  11    1 ICA mid    72  26    1 ICA dist   86  29    1 ECA        101 11 Vertebral  41   9 Subclavian 154 ICA / CCA ratio 0.98 Arm Pressure RIGHT CCA prox   No plaque visualized. CCA mid    No plaque visualized. CCA dist   No plaque visualized. ICA prox   < 10% narrowing by image. ICA mid    No plaque visualized. ICA dist   No abnormality visualized. ECA        No plaque visualized. Vertebral  Antegrade Subclavian Multiphasic, WNL  LEFT       PSV EDV Class CCA prox   151 18 CCA mid    89  15 CCA distal 67  15 ICA prox   57  16  1 ICA mid    73  26  1 ICA distal 65  21  1 ECA        96  8 Vertebral  43  9 Subclavian 206 ICA / CCA ratio 0.82 Arm pressure  LEFT CCA prox   No plaque visualized. CCA mid    No plaque visualized. CCA dist   No plaque visualized. ICA prox   < 10% narrowing by image. ICA mid    No plaque visualized. ICA dist   No abnormality visualized. ECA        No plaque visualized. Vertebral  Antegrade Subclavian Multiphasic, WNL  Final Interpretation Right Carotid: The extracranial vessels were near-normal with only minimal wall                thickening or plaque. Left Carotid: The extracranial vessels were near-normal with only minimal wall               thickening or plaque. Vertebrals:  Both vertebral arteries were patent with antegrade flow. Subclavians: Normal flow hemodynamics were seen in bilateral subclavian              arteries. Electronically signed by [id number] Jodell Cipro MD on 12/02/2020 at 4:10:55 PM.   Final     Cath/Vascular Procedure    Result Date: 12/02/2020  Cardiac Catheterization Laboratory [facility] [place], [place] Tel: [phone] Fax: [phone] CARDIAC CATHETERIZATION REPORT Date of Procedure: 12/01/2020 ___________________________________________________________________________ _ Findings: 1. Low cardiac output and index consistent with known RV failure 2. Moderate pulmonary hypertension with mild elevation of PVR  Recommendations: ?? Per primary team Complications: ?? None ___________________________________________________________________________ _ Referring Physician: Manuella Ghazi, MD' Performing Attending: Hoy Finlay, MD Diagnostic Fellow: Damien Fusi, MD Procedures performed: Right Heart Catheterization Access Site: RIJ Arterial Closure: Manual Compression Fluoroscopy Time: Fluoro time: 1, 0, 0 minutes min Radiation Dose: 7.360 mGy ___________________________________________________________________________ _ History: 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 He presents today  for RHC for evaluation of RV dysfunction and severe pulmonary regurgitation. Procedure Right Heart Catheterization (Internal Jugular) Right The right internal jugular vein was prepped and draped in usual sterile fashion. The right internal jugular vein area was anesthetized locally with lidocaine injection. An ultrasound probe and finder needle was used to identify the right internal jugular vein and a micropuncture needle was then directed into the vessel via ultrasound guidance. Once blood return was achieved, sequential dilatation of the vessel via micropuncture dilator was performed, with ultimate placement of a 7 french cordis introducer. The pulmonary artery catheter was then advanced in the right heart and pulmonary artery with the balloon inflated until wedge position was achieved. Position was noted by following hemodynamic tracings. Pressure was recorded in the RA, RV, PA and wedge positions. PA saturation was measured. Thermodilution cardiac output was performed at least in triplicate. The catheter was then removed and the introducer sheath removed as well. Pressure was held for 10 minutes and hemostasis achieved. There were no immediate post-procedure complications. ___________________________________________________________________________ _ FINDINGS Right Heart Catheterization  Pressures Right atrium Mean 13 mm Hg Right ventricle Systolic 53 mm Hg, End-diastolic 13 mm Hg (13) Pulmonary artery  Systolic 48 mm Hg, Diastolic 13 mm Hg, Mean 26 mm Hg Pulmonary capillary wedge Mean 13 mm Hg Arterial saturation: 96% Mixed venous saturation: 62% Fick cardiac output: 5.49 L/min Fick cardiac index: 2.26 L/min/m^2 Thermal cardiac output: 5.67 L/min Thermal cardiac index: 2.34 L/min/m^2 Systemic vascular resistance (SVR): 1364 dynes*s/cm^5 Pulmonary vascular resistance (PVR): 2.4 Wood units ___________________________________________________________________________ Damien Fusi, MD Cardiology Fellow ___________________________________________________________________________ _ I was present for the entire duration of the procedure, as detailed above Hoy Finlay, MD    Electrophysiology Procedure    Result Date: 12/03/2020  ELECTROPHYSIOLOGY REPORT ? Date of Procedure: 12/03/2020 Preoperative Diagnosis: Atrial flutter Postoperative Diagnosis: Atrial flutter, typical (cavotricuspid isthmus-dependent) ? Procedure(s) Performed: * Electrophysiology study * 3-Dimensional electroanatomic mapping * Catheter ablation with creation of a cavotricuspid isthmus line of block  Proceduralist(s): Huel Coventry, MD Rodman Key, MD MPH Description of Procedure: ? The patient was brought to the cardiac electrophysiology laboratory in the fasting and non-sedated state. An intravenous line was established, surface electrocardiographic electrodes were attached to the patient in standard positions. One set of R2 pads for defibrillation were placed in an antero-posterior configuration.  Blood pressure monitoring was performed using an automatic cuff. The patient's telemetry, blood pressure, and pulse oximetry were monitored throughout the procedure. Conscious/moderate sedation was performed using intravenous midazolam and fentanyl by the EP nursing staff. Local anesthesia was achieved with lidocaine/bupivicaine subcutaneously. For details of drugs administered, please see the anesthesiology record. A transesophageal echocardiogram was performed (see separate report) demonstrating no intracardiac thrombus. ? The groins were prepared and draped using sterile technique and were anesthetized using lidocaine/bupivicaine. A total of two sheaths were inserted into the right femoral vein (52F x1 and 8F x1) using the modified Seldinger technique and ultrasound guidance. A steerable decapolar catheter was not able to be placed in the coronary sinus so it was positioned in close proximity to the coronary sinus ostium. The steerable decapolar catheter was transiently placed in the His position. Right atrial signals were recorded and right atrial pacing performed. 3-dimensional mapping was performed of areas of interest using Abbott hardware and software. ? The patient was in ongoing atrial flutter on arrival in the EP lab. Typical atrial flutter was confirmed by entrainment mapping from the ostium of the coronary sinus and within the cavotricuspid isthmus. A series of  radiofrequency lesions were made from the tricuspid annulus to the inferior vena cava across the CTI. During one of these the atrial flutter abruptly terminated. The linear lesion was subsequently mapped and a line of double potentials was confirmed. Transient bidirectional cavotricuspid isthmus block was demonstrated by pace mapping with loss of block upon ablation cessation. Meyah Corle was increased to 100W, but this was still associated with transient bidirectional block with subsequent recovery. An EP study was then performed including assessment of atrial and AV node function and refractory periods. All catheters and sheaths were then removed and hemostasis was obtained by manual pressure. Estimated Blood Loss: 20 mL Specimen(s) removed: None ? Findings: Baseline (sinus rhythm) intervals (ms) CL 760 PR 177 QRS 99 QT 416 AH 140 HV 58 ? [place] CL 410 AVNERP 340-600 AERP <240-600 ? Ablation type: Radiofrequency Ablation time: 28 minutes 34 seconds Number of lesions: 31 Fluoro time: 14.1 minutes Fluoro dose: 125 mGy - 10,200 mGy x cm^2 I personally spent 110 minutes , continuously monitoring the patient face-to-face during the administration of moderate sedation. Independent observer RN was present for the duration of the procedure to assist in patient monitoring. Pre- and post-sedation activities have been reviewed. ? In summary, this was an uncomplicated electrophysiology study and successful catheter ablation for typical (cavotricuspid isthmus dependent) atrial flutter. Bidirectional block could not be confirmed despite multiple radiofrequency ablations along the CTI line. ? The patient is to follow-up in 4-6 weeks with Dr. Huel Coventry or NP. Rodman Key, MD MPH December 03, 2020 2:27 PM      Discharge Instructions:  Activity Instructions     Activity as tolerated            Other Instructions     Call MD for:  difficulty breathing, headache or visual disturbances      Call MD for:  persistent nausea or vomiting      Call MD for:  severe uncontrolled pain      Call MD for:  temperature >38.5 Celsius      Discharge instructions      You were admitted to [facility] with syncopal episodes, volume overload, and abnormal heart rhythm and has a successful ablation.    You are now ready to discharge and will be discharging to: Home    Please take your medications as prescribed. Medication changes are listed below and a full list of medications is in this discharge packet. Please keep your follow-up appointments after the hospital for ongoing care. It has been a pleasure taking care of you, we wish you the best.     MEDICATION CHANGES:  -- Continue taking torsemide 20mg  twice daily  -- Take the new dose of metoprolol 100mg  daily   -- STOP taking your Losartan, hydralazine, and isosorbide dinitrate  -- STOP taking your old chemo drug Bosutinib, follow up with the oncologists to discuss changing to a new medication    Heart failure is worsened by too much salt and fluid in your body. When you have too much salt and fluid, this makes your heart work much harder. Because your heart is weaker than normal (heart failure) it can't keep up with the extra work. This causes the extra fluid to build up in your legs causing swelling and in your lungs causing you to be very short of breath. We have worked hard during your hospital stay to get the fluid and salt out of your body to reduce the work on your heart. You will continue on  many medications that help your heart work better and also keep fluid off. It is very important that you continue to be compliant with your regimen to improve your heart function and quality of life.     When you are at [facility] clinic, please go to the lab and have the following labs drawn: BMP, magnesium level - these have been ordered for you.    FOLLOW-UP:  We have scheduled you the following appointments:  -8/29        -at 9:15 AM (oncology lab appointment)       -at 10:15 AM with Dr. Malen Gauze (hematology/oncology)       -at 12:00 PM with the Leukemia pharmacist  -9/2 at 3:00 PM with Dr. Dayle Points (Cardiology - Interventional)   -9/22 at 10:15 AM with Dr. Annell Greening (Primary care provider)   -10/4 Dr. Herbert Deaner (Electrophysiology)  -11/2 Dr. Dellis Anes (Heme-onc)   -11/4 Dr. Eliezer Bottom (Endocrine)          Follow Up instructions and Outpatient Referrals     Call MD for:  difficulty breathing, headache or visual disturbances      Call MD for:  persistent nausea or vomiting      Call MD for:  severe uncontrolled pain      Call MD for:  temperature >38.5 Celsius      Discharge instructions      Basic metabolic panel      Is this a fasting order?: No    Release to patient: Immediate     BMP contains the following laboratory tests: NA, K, CL, CO2, BUN, CR,   GLUC, and CA.     Magnesium Level      Release to patient: Immediate      Appointments which have been scheduled for you    Dec 07, 2020  9:15 AM  (Arrive by 8:45 AM)  LAB ONLY [place] with ADULT ONC LAB  [facility] ADULT ONCOLOGY LAB DRAW STATION [facility] REGION) [address]  [place] [address]  [phone]      Dec 07, 2020 10:15 AM  (Arrive by 9:45 AM)  RETURN ACTIVE [place] with Guerry Bruin, MD  [facility] HEMATOLOGY ONCOLOGY 2ND FLR CANCER HOSP [facility] REGION) [address]  [place] [address]  [phone]      Dec 07, 2020 12:00 PM  (Arrive by 11:30 AM)  RETURN ACTIVE [place] with [facility] LEUKEMIA PHARMACIST  [place] HEMATOLOGY ONCOLOGY 2ND FLR CANCER HOSP [facility] REGION) [address]  [place] [address]  [phone]      Dec 11, 2020  3:00 PM  (Arrive by 2:45 PM)  Danelle Earthly RETURN with [name], MD  [facility] CARDIOLOGY [place] [facility]) [address]  [place] [address]  [phone]      Dec 31, 2020 10:30 AM  (Arrive by 10:15 AM)  NEW  RESIDENT with Wyline Beady, MD  [facility] INTERNAL MEDICINE [place] [facility] REGION) [address]  [place] [address]  [phone]      Feb 10, 2021  9:30 AM  (Arrive by 9:00 AM)  LAB ONLY [place] with ADULT ONC LAB  [facility] ADULT ONCOLOGY LAB DRAW STATION [facility] REGION) [address]  [place] [address]  [phone]      Feb 10, 2021 10:30 AM  (Arrive by 10:00 AM)  RETURN FOLLOW UP [place] with Ursula Alert  Debbora Dus  [facility] HEMATOLOGY ONCOLOGY 2ND FLR CANCER HOSP [facility] REGION) [address]  [place] [address]  [phone]      Feb 12, 2021 10:20 AM  (Arrive by 10:05 AM)  NEW  ENDOCRINE with Delorse Limber, MD  [facility] DIABETES AND ENDOCRINOLOGY [place]  [facility] REGION) [address]  [place] [address]  [phone]             Length of Discharge: I spent greater than 30 mins in the discharge of this patient.

## 2020-12-07 ENCOUNTER — Ambulatory Visit: Admit: 2020-12-07 | Discharge: 2020-12-07 | Payer: MEDICARE

## 2020-12-07 ENCOUNTER — Ambulatory Visit: Admit: 2020-12-07 | Discharge: 2020-12-07 | Payer: MEDICARE | Attending: Internal Medicine | Primary: Internal Medicine

## 2020-12-07 ENCOUNTER — Other Ambulatory Visit: Admit: 2020-12-07 | Discharge: 2020-12-07 | Payer: MEDICARE

## 2020-12-07 DIAGNOSIS — C9210 Chronic myeloid leukemia, BCR/ABL-positive, not having achieved remission: Principal | ICD-10-CM

## 2020-12-07 DIAGNOSIS — Z79899 Other long term (current) drug therapy: Principal | ICD-10-CM

## 2020-12-07 LAB — SLIDE REVIEW

## 2020-12-07 LAB — COMPREHENSIVE METABOLIC PANEL
ALBUMIN: 3.6 g/dL (ref 3.4–5.0)
ALKALINE PHOSPHATASE: 326 U/L — ABNORMAL HIGH (ref 46–116)
ALT (SGPT): 45 U/L (ref 10–49)
ANION GAP: 11 mmol/L (ref 5–14)
AST (SGOT): 40 U/L — ABNORMAL HIGH (ref <=34)
BILIRUBIN TOTAL: 0.8 mg/dL (ref 0.3–1.2)
BLOOD UREA NITROGEN: 54 mg/dL — ABNORMAL HIGH (ref 9–23)
BUN / CREAT RATIO: 17
CALCIUM: 9.6 mg/dL (ref 8.7–10.4)
CHLORIDE: 104 mmol/L (ref 98–107)
CO2: 21 mmol/L (ref 20.0–31.0)
CREATININE: 3.2 mg/dL — ABNORMAL HIGH
EGFR CKD-EPI (2021) MALE: 23 mL/min/{1.73_m2} — ABNORMAL LOW (ref >=60)
GLUCOSE RANDOM: 208 mg/dL — ABNORMAL HIGH (ref 70–179)
POTASSIUM: 4.7 mmol/L (ref 3.4–4.8)
PROTEIN TOTAL: 7.5 g/dL (ref 5.7–8.2)
SODIUM: 136 mmol/L (ref 135–145)

## 2020-12-07 LAB — CBC W/ AUTO DIFF
BASOPHILS ABSOLUTE COUNT: 0 10*9/L (ref 0.0–0.1)
BASOPHILS RELATIVE PERCENT: 0.2 %
EOSINOPHILS ABSOLUTE COUNT: 0.3 10*9/L (ref 0.0–0.5)
EOSINOPHILS RELATIVE PERCENT: 1.6 %
HEMATOCRIT: 38.5 % — ABNORMAL LOW (ref 39.0–48.0)
HEMOGLOBIN: 12.5 g/dL — ABNORMAL LOW (ref 12.9–16.5)
LYMPHOCYTES ABSOLUTE COUNT: 1.9 10*9/L (ref 1.1–3.6)
LYMPHOCYTES RELATIVE PERCENT: 11.6 %
MEAN CORPUSCULAR HEMOGLOBIN CONC: 32.4 g/dL (ref 32.0–36.0)
MEAN CORPUSCULAR HEMOGLOBIN: 26.7 pg (ref 25.9–32.4)
MEAN CORPUSCULAR VOLUME: 82.4 fL (ref 77.6–95.7)
MEAN PLATELET VOLUME: 9.6 fL (ref 6.8–10.7)
MONOCYTES ABSOLUTE COUNT: 3.3 10*9/L — ABNORMAL HIGH (ref 0.3–0.8)
MONOCYTES RELATIVE PERCENT: 20.6 %
NEUTROPHILS ABSOLUTE COUNT: 10.5 10*9/L — ABNORMAL HIGH (ref 1.8–7.8)
NEUTROPHILS RELATIVE PERCENT: 66 %
PLATELET COUNT: 170 10*9/L (ref 150–450)
RED BLOOD CELL COUNT: 4.67 10*12/L (ref 4.26–5.60)
RED CELL DISTRIBUTION WIDTH: 16.2 % — ABNORMAL HIGH (ref 12.2–15.2)
WBC ADJUSTED: 16 10*9/L — ABNORMAL HIGH (ref 3.6–11.2)

## 2020-12-07 LAB — LACTATE DEHYDROGENASE: LACTATE DEHYDROGENASE: 355 U/L — ABNORMAL HIGH (ref 120–246)

## 2020-12-07 LAB — MAGNESIUM: MAGNESIUM: 1.8 mg/dL (ref 1.6–2.6)

## 2020-12-07 LAB — LIPASE: LIPASE: 62 U/L — ABNORMAL HIGH (ref 12–53)

## 2020-12-07 LAB — URIC ACID: URIC ACID: 11.6 mg/dL — ABNORMAL HIGH

## 2020-12-07 LAB — AMYLASE: AMYLASE: 96 U/L (ref 30–118)

## 2020-12-07 LAB — PHOSPHORUS: PHOSPHORUS: 4.5 mg/dL (ref 2.4–5.1)

## 2020-12-07 MED ORDER — ASCIMINIB 40 MG TABLET
ORAL_TABLET | Freq: Two times a day (BID) | ORAL | 11 refills | 30 days | Status: CP
Start: 2020-12-07 — End: 2021-01-06
  Filled 2020-12-09: qty 60, 30d supply, fill #0

## 2020-12-07 MED ORDER — ALLOPURINOL 100 MG TABLET
ORAL_TABLET | Freq: Every day | ORAL | 0 refills | 30 days | Status: CP
Start: 2020-12-07 — End: ?

## 2020-12-07 MED ORDER — FOLIC ACID 1 MG TABLET
ORAL_TABLET | Freq: Every day | ORAL | 3 refills | 90 days | Status: CP
Start: 2020-12-07 — End: ?

## 2020-12-07 MED ORDER — ROSUVASTATIN 20 MG TABLET
ORAL_TABLET | Freq: Every day | ORAL | 0 refills | 90 days | Status: CP
Start: 2020-12-07 — End: ?

## 2020-12-07 NOTE — Unmapped (Signed)
Nice to see you again.    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  We will do a follow up call to make sure you are tolerating it (in around 2 weeks) and then see you back in around a month.      Lab on 12/07/2020   Component Date Value Ref Range Status    Sodium 12/07/2020 136  135 - 145 mmol/L Final    Potassium 12/07/2020 4.7  3.4 - 4.8 mmol/L Final    Chloride 12/07/2020 104  98 - 107 mmol/L Final    CO2 12/07/2020 21.0  20.0 - 31.0 mmol/L Final    Anion Gap 12/07/2020 11  5 - 14 mmol/L Final    BUN 12/07/2020 54 (A) 9 - 23 mg/dL Final    Creatinine 16/01/9603 3.20 (A) 0.60 - 1.10 mg/dL Final    BUN/Creatinine Ratio 12/07/2020 17   Final    eGFR CKD-EPI (2021) Male 12/07/2020 23 (A) >=60 mL/min/1.3m2 Final    eGFR calculated with CKD-EPI 2021 equation in accordance with [employer] and [employer] of Nephrology Task Force recommendations.    Glucose 12/07/2020 208 (A) 70 - 179 mg/dL Final    Calcium 54/12/8117 9.6  8.7 - 10.4 mg/dL Final    Albumin 14/78/2956 3.6  3.4 - 5.0 g/dL Final    Total Protein 12/07/2020 7.5  5.7 - 8.2 g/dL Final    Total Bilirubin 12/07/2020 0.8  0.3 - 1.2 mg/dL Final    AST 21/30/8657 40 (A) <=34 U/L Final    ALT 12/07/2020 45  10 - 49 U/L Final    Alkaline Phosphatase 12/07/2020 326 (A) 46 - 116 U/L Final    Magnesium 12/07/2020 1.8  1.6 - 2.6 mg/dL Final    LDH 84/69/6295 355 (A) 120 - 246 U/L Final    Uric Acid 12/07/2020 11.6 (A) 3.7 - 9.2 mg/dL Final    Phosphorus 28/41/3244 4.5  2.4 - 5.1 mg/dL Final    WBC 04/13/7251 16.0 (A) 3.6 - 11.2 10*9/L Final    RBC 12/07/2020 4.67  4.26 - 5.60 10*12/L Final    HGB 12/07/2020 12.5 (A) 12.9 - 16.5 g/dL Final    HCT 66/44/0347 38.5 (A) 39.0 - 48.0 % Final    MCV 12/07/2020 82.4  77.6 - 95.7 fL Final    MCH 12/07/2020 26.7  25.9 - 32.4 pg Final    MCHC 12/07/2020 32.4  32.0 - 36.0 g/dL Final    RDW 42/59/5638 16.2 (A) 12.2 - 15.2 % Final    MPV 12/07/2020 9.6  6.8 - 10.7 fL Final Platelet 12/07/2020 170  150 - 450 10*9/L Final    Neutrophils % 12/07/2020 66.0  % Final    Lymphocytes % 12/07/2020 11.6  % Final    Monocytes % 12/07/2020 20.6  % Final    Eosinophils % 12/07/2020 1.6  % Final    Basophils % 12/07/2020 0.2  % Final    Absolute Neutrophils 12/07/2020 10.5 (A) 1.8 - 7.8 10*9/L Final    Absolute Lymphocytes 12/07/2020 1.9  1.1 - 3.6 10*9/L Final    Absolute Monocytes 12/07/2020 3.3 (A) 0.3 - 0.8 10*9/L Final    Absolute Eosinophils 12/07/2020 0.3  0.0 - 0.5 10*9/L Final    Absolute Basophils 12/07/2020 0.0  0.0 - 0.1 10*9/L Final    Anisocytosis 12/07/2020 Slight (A) Not Present Final    Hypochromasia 12/07/2020 Slight (A) Not Present Final

## 2020-12-07 NOTE — Unmapped (Signed)
Labs drawn peripherally by Evette Cristal.

## 2020-12-07 NOTE — Unmapped (Signed)
[facility] Specialty Medication Onboarding    Specialty Medication: Scemblix 40mg   Prior Authorization: Not Required   Financial Assistance: No - copay  <$25  Final Copay/Day Supply: $0 / 30    Insurance Restrictions: None     Notes to Pharmacist:     The triage team has completed the benefits investigation and has determined that the patient is able to fill this medication at [facility]. Please contact the patient to complete the onboarding or follow up with the prescribing physician as needed.

## 2020-12-08 NOTE — Unmapped (Signed)
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 reports he feels well today. Has been feeling good since leaving the hospital. Has been off of bosutinib as instructed. We reviewed medication list in detail. We discussed education, dosing/administration, toxicities, and monitoring for asciminib. Will plan to transition to asciminib with start once he receives it from [facility] and he confirmed understanding of plan.     Labs: WBC 16.0; ANC 105; Hgb 12.5; PLT 170; CMP with creatinine 3.20 (history of CKD); AST elevated at 40; uric acid 11.6; baseline amylase 96; baseline lipase 62; gluc elevated at 208  Vitals: BP 131/87; HR 95    Oncologic History:  Oncology History Overview Note   CML, chronic phase:   Leukocytosis to ~30 at time of diagnosis with normal platelet count and Hgb.     Bone marrow biopsy 06/26/2015:   CML, chronic phase (2% blasts, 2% promyelocytes)    Aspirate Smear:   The aspirate smear is hypercellular with rare small spicules.   Myeloid elements are increased with full maturation, without significant   cytologic atypia. Erythroid elements are markedly decreased, without significant cytologic atypia. The myeloid to erythroid ratio is >10:1. Many small hypolobated megakaryocytes are present. Storage iron is present; ringed sideroblasts are not identified.     A differential count of 200 nucleated cells shows the following   percentages:   2 Blasts   2 Promyelocytes   31 Myelocytes/Metamyelocytes   51 Bands/Neutrophils   3 Monocytes   3 Eosinophils   1 Basophils   1 Lymphocytes   0 Plasma cells   6 NRBCs     Biopsy:   H&E and PAS stained sections show unremarkable trabecular bone and markedly hypercellular marrow for age (95% cellularity). The myeloid to erythroid ratio is >5:1. Erythroid elements are decreased with full maturation. Myeloid elements are markedly increased with full maturation.   Megakaryocytes are increased with many small hypolobated forms.   Scattered small lymphocytes and plasma cells account for <5% of   cellularity. A reticulin stain shows grade 1 of 3 fibrosis (European Consensus System).     Flow cytometry:   Flow cytometry performed on the bone marrow aspirate ([id number])   demonstrates a non-discrete population of blasts and maturing   myelomonocytic cells within the blast gate (<2% of total events).     CBC:   A tandem CBC shows: WBC 27.9 (55.7% neutrophils, 0.9% bands, 0.9%   metamyelocytes, 11.3% myelocytes, 10.4% lymphocytes, 13.2% monocytes, 1.0%   eosinophils, 4.7% basophils), Hgb 13.4, MCV 87, Plt 224.     Treatment:   Imatinib, 400mg , 06/26/2015-05/17/2019  Bosutiib, 400 mg 10/04/2019 -     BCR/ABL testing:   06/2015: 87.086%  07/17/2015: 140.922%  08/18/2015: 75.693%  12/08/2015: 2.144%  01/07/2016: 2.450%  03/29/2016: 2.008%  07/26/2016: 2.871%; Kinase domain mutation testing negative  10/28/2016: 1.247%  01/05/2017: 0.641%  03/14/2017: 0.348%; Kinase domain mutation testing negative  06/25/19: Result not available   07/26/19: Result not available      07/26/19 FISH: positive t(9;22) in ~90.5% of cells     Final Diagnosis   Date Value Ref Range Status   08/20/2019   Final  Bone marrow, right iliac, aspiration and biopsy  -  Hypercellular bone marrow (95%) involved by chronic myeloid leukemia, BCR-ABL1-positive, chronic phase (2% blasts by manual aspirate differential)  -  See linked reports for associated Ancillary Studies.      This electronic signature is attestation that the pathologist personally reviewed the submitted material(s) and the final diagnosis reflects that evaluation.         08/12/19 BCR/ABL: 28.427 %  08/20/19 BCR/ABL: 35.897%    08/12/19 TKI Resistance Testing: Negative    10/04/19: started bosutinib 400 mg daily    Current treatment:   Bosutinib 400 mg daily     Chronic myeloid leukemia (CMS-HCC)   06/26/2015 Initial Diagnosis    Chronic myeloid leukemia (CMS-HCC)     06/26/2015 - 05/17/2019 Chemotherapy    Imatinib, 400mg  daily; discontinued due to worsening edema     10/04/2019 -  Chemotherapy    Bosutinib 400mg          Weight and Vitals:  Wt Readings from Last 3 Encounters:   12/07/20 (!) 120.2 kg (264 lb 14.4 oz)   12/04/20 (!) 120.2 kg (265 lb 1.7 oz)   11/24/20 (!) 127.9 kg (282 lb)     Temp Readings from Last 3 Encounters:   12/07/20 36.5 ??C (97.7 ??F) (Temporal)   12/04/20 36.7 ??C (98.1 ??F) (Oral)   11/12/20 36.6 ??C (97.8 ??F) (Oral)     BP Readings from Last 3 Encounters:   12/07/20 131/87   12/04/20 133/82   11/24/20 138/81     Pulse Readings from Last 3 Encounters:   12/07/20 95   12/04/20 84   11/24/20 69       Pertinent Labs:  Lab on 12/07/2020   Component Date Value Ref Range Status   ??? Sodium 12/07/2020 136  135 - 145 mmol/L Final   ??? Potassium 12/07/2020 4.7  3.4 - 4.8 mmol/L Final   ??? Chloride 12/07/2020 104  98 - 107 mmol/L Final   ??? CO2 12/07/2020 21.0  20.0 - 31.0 mmol/L Final   ??? Anion Gap 12/07/2020 11  5 - 14 mmol/L Final   ??? BUN 12/07/2020 54 (A) 9 - 23 mg/dL Final   ??? Creatinine 12/07/2020 3.20 (A) 0.60 - 1.10 mg/dL Final   ??? BUN/Creatinine Ratio 12/07/2020 17   Final   ??? eGFR CKD-EPI (2021) Male 12/07/2020 23 (A) >=60 mL/min/1.45m2 Final    eGFR calculated with CKD-EPI 2021 equation in accordance with [employer] and [employer] of Nephrology Task Force recommendations.   ??? Glucose 12/07/2020 208 (A) 70 - 179 mg/dL Final   ??? Calcium 16/01/9603 9.6  8.7 - 10.4 mg/dL Final   ??? Albumin 54/12/8117 3.6  3.4 - 5.0 g/dL Final   ??? Total Protein 12/07/2020 7.5  5.7 - 8.2 g/dL Final   ??? Total Bilirubin 12/07/2020 0.8  0.3 - 1.2 mg/dL Final   ??? AST 14/78/2956 40 (A) <=34 U/L Final   ??? ALT 12/07/2020 45  10 - 49 U/L Final   ??? Alkaline Phosphatase 12/07/2020 326 (A) 46 - 116 U/L Final   ??? Magnesium 12/07/2020 1.8  1.6 - 2.6 mg/dL Final   ??? LDH 21/30/8657 355 (A) 120 - 246 U/L Final   ??? Uric Acid 12/07/2020 11.6 (A) 3.7 - 9.2 mg/dL Final   ??? Phosphorus 12/07/2020 4.5  2.4 - 5.1 mg/dL Final   ??? WBC 84/69/6295 16.0 (A) 3.6 - 11.2 10*9/L Final   ??? RBC 12/07/2020 4.67  4.26 -  5.60 10*12/L Final   ??? HGB 12/07/2020 12.5 (A) 12.9 - 16.5 g/dL Final   ??? HCT 16/01/9603 38.5 (A) 39.0 - 48.0 % Final   ??? MCV 12/07/2020 82.4  77.6 - 95.7 fL Final   ??? MCH 12/07/2020 26.7  25.9 - 32.4 pg Final   ??? MCHC 12/07/2020 32.4  32.0 - 36.0 g/dL Final   ??? RDW 54/12/8117 16.2 (A) 12.2 - 15.2 % Final   ??? MPV 12/07/2020 9.6  6.8 - 10.7 fL Final   ??? Platelet 12/07/2020 170  150 - 450 10*9/L Final   ??? Neutrophils % 12/07/2020 66.0  % Final   ??? Lymphocytes % 12/07/2020 11.6  % Final   ??? Monocytes % 12/07/2020 20.6  % Final   ??? Eosinophils % 12/07/2020 1.6  % Final   ??? Basophils % 12/07/2020 0.2  % Final   ??? Absolute Neutrophils 12/07/2020 10.5 (A) 1.8 - 7.8 10*9/L Final   ??? Absolute Lymphocytes 12/07/2020 1.9  1.1 - 3.6 10*9/L Final   ??? Absolute Monocytes 12/07/2020 3.3 (A) 0.3 - 0.8 10*9/L Final   ??? Absolute Eosinophils 12/07/2020 0.3  0.0 - 0.5 10*9/L Final   ??? Absolute Basophils 12/07/2020 0.0  0.0 - 0.1 10*9/L Final   ??? Anisocytosis 12/07/2020 Slight (A) Not Present Final   ??? Hypochromasia 12/07/2020 Slight (A) Not Present Final   ??? Smear Review Comments 12/07/2020 See Comment (A) Undefined Final    slide reviewed.  [id number]     ??? Polychromasia 12/07/2020 Slight (A) Not Present Final   ??? Burr Cells 12/07/2020 Present (A) Not Present Final   ??? Acanthocytes 12/07/2020 Present (A) Not Present Final   ??? Toxic Vacuolation 12/07/2020 Present (A) Not Present Final   ??? Poikilocytosis 12/07/2020 Moderate (A) Not Present Final   ??? Amylase 12/07/2020 96  30 - 118 U/L Final   ??? Lipase 12/07/2020 62 (A) 12 - 53 U/L Final       Allergies: No Known Allergies    Drug Interactions:  CYP2C9 Inhibitors (Moderate) may increase the serum concentration of torsemide      Current Medications:  Current Outpatient Medications   Medication Sig Dispense Refill   ??? acetaminophen (TYLENOL) 325 MG tablet Take 650 mg by mouth every six (6) hours as needed for pain.     ??? allopurinoL (ZYLOPRIM) 100 MG tablet Take 2 tablets (200 mg total) by mouth daily. 60 tablet 0   ??? amLODIPine (NORVASC) 10 MG tablet Take 10 mg by mouth daily.     ??? apixaban (ELIQUIS) 5 mg Tab Take 5 mg by mouth Two (2) times a day.     ??? folic acid (FOLVITE) 1 MG tablet Take 1 tablet (1 mg total) by mouth in the morning. 90 tablet 3   ??? HUMULIN R REGULAR U-100 INSULN 100 unit/mL injection INJECT 15 UNITS SUBCUTANEOUSLY THREE TIMES DAILY BEFORE MEAL(S)     ??? insulin glargine (BASAGLAR, LANTUS) 100 unit/mL (3 mL) injection pen Inject 0.32 mL (32 Units total) under the skin nightly. 15 mL 12   ??? insulin syringe-needle U-100 0.5 mL 29 gauge x 1/2 (12 mm) Syrg Use to inject 4 times daily as directed.     ??? metoprolol succinate (TOPROL-XL) 100 MG 24 hr tablet Take 1 tablet (100 mg total) by mouth daily. 30 tablet 1   ??? OZEMPIC 0.25 mg or 0.5 mg(2 mg/1.5 mL) PnIj injection Inject 0.25 mg under the skin every seven (7) days.     ???  pantoprazole (PROTONIX) 40 MG tablet Take 40 mg by mouth daily.     ??? rosuvastatin (CRESTOR) 20 MG tablet Take 1 tablet (20 mg total) by mouth daily. 90 tablet 0   ??? torsemide (DEMADEX) 20 MG tablet Take 20 mg by mouth two (2) times a day. As needed     ??? asciminib (SCEMBLIX) 40 mg tablet Take 1 tablet (40 mg total) by mouth Two (2) times a day. Take on an empty stomach, at least 1 hour before or 2 hours after a meal. Swallow tablets whole. Do not break, crush, or chew the tablets. (Patient not taking: Reported on 12/07/2020) 60 tablet 11   ??? BINAXNOW COVID-19 AG SELF TEST Kit Use as Directed on the Package (Patient not taking: Reported on 12/07/2020)     ??? blood sugar diagnostic Strp Twice daily testing for 30 days (Patient not taking: Reported on 12/07/2020)     ??? blood-glucose meter Misc USE AS DIRECTED (Patient not taking: Reported on 12/07/2020)     ??? lancets Misc by Miscellaneous route.     ??? ONETOUCH VERIO TEST STRIPS Strp USE 1 STRIP TO CHECK GLUCOSE TWICE DAILY     ??? pen needle, diabetic 29 gauge x 1/2 (12 mm) Ndle Please substitute for cheapest generic available. Diagnosis E11.9.Z79.4 110 each 0   ??? pregabalin (LYRICA) 100 MG capsule Take 100 mg by mouth Three (3) times a day.       No current facility-administered medications for this visit.       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 [name] was recently admitted for recurrent syncopal episodes in the setting of Afib/aflutter, and HFrEF/pulmonary insufficiency. During this time, bosutinib was on hold to minimize risk of electrolyte abnormalities and fluid overload. Has also had a suboptimal response to bosutinib which was started in June of 2021. Transitioning to asciminib 40 mg BID now - will start when he receives from [facility].     Plan:   1) CML  -Start asciminib 40 mg BID - will start this week once delivered from [facility] (delivery scheduled for 9/1)  -Local labs ([facility]) and virtual visit with CPP in 2 weeks    2) TLS prophylaxis/ history of gout  -Increase allopurinol to 200 mg daily given uric acid today elevated at 11.6 (also has history of CKD)    3) Supportive care  -Continue prn antiemetic    Ronnald Collum, PharmD, BCOP, CPP  Hematology/Oncology Clinical Pharmacist  Pager [phone]

## 2020-12-11 ENCOUNTER — Ambulatory Visit: Admit: 2020-12-11 | Discharge: 2020-12-12 | Payer: MEDICARE

## 2020-12-11 DIAGNOSIS — I5020 Unspecified systolic (congestive) heart failure: Principal | ICD-10-CM

## 2020-12-11 DIAGNOSIS — Q221 Congenital pulmonary valve stenosis: Principal | ICD-10-CM

## 2020-12-11 LAB — BASIC METABOLIC PANEL
ANION GAP: 8 mmol/L (ref 5–14)
BLOOD UREA NITROGEN: 52 mg/dL — ABNORMAL HIGH (ref 9–23)
BUN / CREAT RATIO: 18
CALCIUM: 9.8 mg/dL (ref 8.7–10.4)
CHLORIDE: 108 mmol/L — ABNORMAL HIGH (ref 98–107)
CO2: 22 mmol/L (ref 20.0–31.0)
CREATININE: 2.85 mg/dL — ABNORMAL HIGH
EGFR CKD-EPI (2021) MALE: 26 mL/min/{1.73_m2} — ABNORMAL LOW (ref >=60)
GLUCOSE RANDOM: 194 mg/dL — ABNORMAL HIGH (ref 70–179)
POTASSIUM: 4.4 mmol/L (ref 3.4–4.8)
SODIUM: 138 mmol/L (ref 135–145)

## 2020-12-11 NOTE — Unmapped (Signed)
[facility] Leukemia Clinic Follow-up    Patient Name: Colin Hunter  Patient Age: 50 y.o.  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  Given the cardiac and metabolic toxicities of nilotinib, ponatinib and dasatinib, we tried bosutinib as a 2nd line agent, which did not result in molecular response, though drug adherence may have been an issue early in his course.  More recently he has had good reported adherence and some concern that the bosutinib and GI effects led to electrolyte deficiencies, atrial fibrillation and syncope.  For these reasons, and because there was no ABL kinase point mutation on a recent ABL kinase mutation sequencing assay, we recommended asciminib 40 mg per day.  I discussed this recommendation with the patient and his wife today, including discussion of toxicities.  The drug has been authorized and will ship to him this week.  We will plan to start with close follow up for toxicity and hematologic response.          Plan and Recommendations:  Chronic Myeloid Leukemia:   --Begin asciminib 40 mg PO BID  - BCR-ABL in 3 months  - CPP follow up in 2-3 weeks to assess initial drug adherence and tolerance.  - MD/APP follow up in 1 month    -- HFrEF (EF 30-35%): follow up with Dr. Derinda Sis at [facility]  Cardiology.  If he fails asciminib, consultation with cardio-oncology would be reasonable before considering alternate agents that have known cardiac toxicity.    T2DM:   - followed by PCP  - multiple ED visit with uncontrolled levels    Hx Syncope/Pre-syncope: perhaps improved risk after ablation for atrial fibrillation.    -- Hypercalcemia: Low PTH previously.    -- CKD Stage 3: Baseline creatinine ~2. Likely secondary to diabetic and hypertensive nephropathy.    --  -- Right atrial thrombus: Emerged despite anticoagulation with Eliquis. Since transitioned to Warfarin (as of 05/2019) though has had issues with supratherapeutic INR levels. We will need to monitor for even modest thrombocytopenia as he starts asciminib    Infection management: no active infection    Symptom management: Antiemetics as listed in medication list, analgesics as listed in medication list or the following changes were made: instructed on use of loperamide for diarrhea induced by bosutinib    Venous access: peripheral venous access only     Supportive Care Recommendations:  We recommend based on the patient???s underlying diagnosis and treatment history the following supportive care:      1. Antimicrobial prophylaxis:  None    2. Blood product support:  I recommend the following intervals for laboratory monitoring to determine transfusion needs and monitor for hematologic effects of therapy and underlying disease: no routine monitoring outside of clinic follow-up    Leukoreduced blood products are required.  Irradiated blood products are preferred, but in case of urgent transfusion needs non-irradiated blood products may be used:     -  RBC transfusion threshold: transfuse 2 units  for Hgb < 8 g/dL.  -  Platelet transfusion threshold: transfuse 1 unit of platelets for platelet count < 10, or for bleeding or need for invasive procedure.    3. Hematopoietic growth factor support: none      Care coordination: The next [facility] Leukemia Clinic Follow up will be requested for 2 weeks with CPP, 1 month with MD/APP    These services include the following elements of medical decision making:  addressing a hematologic cancer that poses a threat to life and/or bone marrow function  review of external medical documentation, interpretation of diagnostic test reports or ordering of diagnostic tests and discussion of management with provider(s) external to the leukemia clinic  High risk of morbidity due to drug therapy requiring monitoring for toxicity or decisions regarding goals and continuation of antineoplastic therapy      Molli Hazard C. Malen Gauze, MD  Leukemia Program  Division of Hematology/Oncology  [facility]    Nurse Navigator (non-clinical trial patients): Colletta Maryland, RN        Tel. [phone]       Fax. [phone]  Toll-free appointments: [phone]  Scheduling assistance: [phone]  After hours/weekends: [phone] (ask for adult hematology/oncology on-call)        History of Present Illness:  We had the pleasure of seeing Colin Hunter in the Leukemia Clinic at the [facility] on 12/07/2020.  He is a 50 y.o. male with Chronic Myeloid Leukemia.        His oncologic history is as follows:      Oncology History Overview Note   CML, chronic phase:   Leukocytosis to ~30 at time of diagnosis with normal platelet count and Hgb.     Bone marrow biopsy 06/26/2015:   CML, chronic phase (2% blasts, 2% promyelocytes)    Aspirate Smear:   The aspirate smear is hypercellular with rare small spicules.   Myeloid elements are increased with full maturation, without significant   cytologic atypia. Erythroid elements are markedly decreased, without significant cytologic atypia. The myeloid to erythroid ratio is >10:1. Many small hypolobated megakaryocytes are present. Storage iron is present; ringed sideroblasts are not identified.     A differential count of 200 nucleated cells shows the following   percentages:   2 Blasts   2 Promyelocytes   31 Myelocytes/Metamyelocytes   51 Bands/Neutrophils   3 Monocytes   3 Eosinophils   1 Basophils   1 Lymphocytes   0 Plasma cells   6 NRBCs     Biopsy:   H&E and PAS stained sections show unremarkable trabecular bone and markedly hypercellular marrow for age (95% cellularity). The myeloid to erythroid ratio is >5:1. Erythroid elements are decreased with full maturation. Myeloid elements are markedly increased with full maturation.   Megakaryocytes are increased with many small hypolobated forms.   Scattered small lymphocytes and plasma cells account for <5% of   cellularity. A reticulin stain shows grade 1 of 3 fibrosis (European Consensus System).     Flow cytometry:   Flow cytometry performed on the bone marrow aspirate ([id number])   demonstrates a non-discrete population of blasts and maturing   myelomonocytic cells within the blast gate (<2% of total events).     CBC:   A tandem CBC shows: WBC 27.9 (55.7% neutrophils, 0.9% bands, 0.9%   metamyelocytes, 11.3% myelocytes, 10.4% lymphocytes, 13.2% monocytes, 1.0%   eosinophils, 4.7% basophils), Hgb 13.4, MCV 87, Plt 224.     Treatment:   Imatinib, 400mg , 06/26/2015-05/17/2019  Bosutiib, 400 mg 10/04/2019 -     BCR/ABL testing:   06/2015: 87.086%  07/17/2015: 140.922%  08/18/2015: 75.693%  12/08/2015: 2.144%  01/07/2016: 2.450%  03/29/2016: 2.008%  07/26/2016: 2.871%; Kinase domain mutation testing negative  10/28/2016: 1.247%  01/05/2017: 0.641%  03/14/2017: 0.348%; Kinase domain mutation testing negative  06/25/19: Result not available   07/26/19: Result not available      07/26/19 FISH: positive t(9;22) in ~90.5% of cells     Final Diagnosis   Date Value Ref Range Status   08/20/2019   Final    Bone marrow, right iliac, aspiration and biopsy  -  Hypercellular bone marrow (95%) involved by chronic myeloid leukemia, BCR-ABL1-positive, chronic phase (2% blasts by manual aspirate differential)  -  See linked reports for associated Ancillary Studies.      This electronic signature is attestation that the pathologist personally reviewed the submitted material(s) and the final diagnosis reflects that evaluation.         08/12/19 BCR/ABL: 28.427 %  08/20/19 BCR/ABL: 35.897%    08/12/19 TKI Resistance Testing: Negative    10/04/19: started bosutinib 400 mg daily    Current treatment:   Bosutinib 400 mg daily     Chronic myeloid leukemia (CMS-HCC)   06/26/2015 Initial Diagnosis    Chronic myeloid leukemia (CMS-HCC)     06/26/2015 - 05/17/2019 Chemotherapy    Imatinib, 400mg  daily; discontinued due to worsening edema     10/04/2019 -  Chemotherapy    Bosutinib 400mg          Interim History:  Since last seen here, he was hospitalized after recurrent syncope, underwent righ heart catheterization and ablation for atrial fibrillation.  He reports no longer having presyncopal symptoms.   Here with his wife today.        Past Medical, Surgical and Family History were reviewed and pertinent updates were made in the Electronic Medical Record      ECOG Performance Status: 1    Medications:    Current Outpatient Medications   Medication Sig Dispense Refill   ??? acetaminophen (TYLENOL) 325 MG tablet Take 650 mg by mouth every six (6) hours as needed for pain.     ??? amLODIPine (NORVASC) 10 MG tablet Take 10 mg by mouth daily.     ??? apixaban (ELIQUIS) 5 mg Tab Take 5 mg by mouth Two (2) times a day.     ??? folic acid (FOLVITE) 1 MG tablet Take 1 tablet (1 mg total) by mouth in the morning. 90 tablet 3   ??? HUMULIN R REGULAR U-100 INSULN 100 unit/mL injection INJECT 15 UNITS SUBCUTANEOUSLY THREE TIMES DAILY BEFORE MEAL(S)     ??? insulin glargine (BASAGLAR, LANTUS) 100 unit/mL (3 mL) injection pen Inject 0.32 mL (32 Units total) under the skin nightly. 15 mL 12   ??? insulin syringe-needle U-100 0.5 mL 29 gauge x 1/2 (12 mm) Syrg Use to inject 4 times daily as directed.     ??? lancets Misc by Miscellaneous route.     ??? metoprolol succinate (TOPROL-XL) 100 MG 24 hr tablet Take 1 tablet (100 mg total) by mouth daily. 30 tablet 1   ??? ONETOUCH VERIO TEST STRIPS Strp USE 1 STRIP TO CHECK GLUCOSE TWICE DAILY     ??? OZEMPIC 0.25 mg or 0.5 mg(2 mg/1.5 mL) PnIj injection Inject 0.25 mg under the skin every seven (7) days.     ??? pen needle, diabetic 29 gauge x 1/2 (12 mm) Ndle Please substitute for cheapest  generic available. Diagnosis E11.9.Z79.4 110 each 0   ??? rosuvastatin (CRESTOR) 20 MG tablet Take 1 tablet (20 mg total) by mouth daily. 90 tablet 0   ??? torsemide (DEMADEX) 20 MG tablet Take 20 mg by mouth two (2) times a day. As needed     ??? allopurinoL (ZYLOPRIM) 100 MG tablet Take 2 tablets (200 mg total) by mouth daily. 60 tablet 0   ??? asciminib (SCEMBLIX) 40 mg tablet Take 1 tablet (40 mg total) by mouth Two (2) times a day. Take on an empty stomach, at least 1 hour before or 2 hours after a meal. Swallow tablets whole. Do not break, crush, or chew the tablets. (Patient not taking: Reported on 12/07/2020) 60 tablet 11   ??? BINAXNOW COVID-19 AG SELF TEST Kit Use as Directed on the Package (Patient not taking: Reported on 12/07/2020)     ??? blood sugar diagnostic Strp Twice daily testing for 30 days (Patient not taking: Reported on 12/07/2020)     ??? blood-glucose meter Misc USE AS DIRECTED (Patient not taking: Reported on 12/07/2020)     ??? pantoprazole (PROTONIX) 40 MG tablet Take 40 mg by mouth daily.       No current facility-administered medications for this visit.       Vital Signs:  Vitals:    12/07/20 1046   BP: 131/87   Pulse: 95   Resp: 16   Temp: 36.5 ??C (97.7 ??F)   SpO2: 99%         Relevant Physical Exam Findings:  none        Relevant Laboratory, radiology and pathology results:  I personally viewed the most recent internal records, external records, laboratory results and molecular pathology reports  and discussed the available results with the patient and the pharmacy team  A summary of results follows:  Lab on 12/07/2020   Component Date Value Ref Range Status   ??? Sodium 12/07/2020 136  135 - 145 mmol/L Final   ??? Potassium 12/07/2020 4.7  3.4 - 4.8 mmol/L Final   ??? Chloride 12/07/2020 104  98 - 107 mmol/L Final   ??? CO2 12/07/2020 21.0  20.0 - 31.0 mmol/L Final   ??? Anion Gap 12/07/2020 11  5 - 14 mmol/L Final   ??? BUN 12/07/2020 54 (A) 9 - 23 mg/dL Final   ??? Creatinine 12/07/2020 3.20 (A) 0.60 - 1.10 mg/dL Final   ??? BUN/Creatinine Ratio 12/07/2020 17   Final   ??? eGFR CKD-EPI (2021) Male 12/07/2020 23 (A) >=60 mL/min/1.45m2 Final    eGFR calculated with CKD-EPI 2021 equation in accordance with [employer] and [employer] of Nephrology Task Force recommendations.   ??? Glucose 12/07/2020 208 (A) 70 - 179 mg/dL Final   ??? Calcium 16/01/9603 9.6  8.7 - 10.4 mg/dL Final   ??? Albumin 54/12/8117 3.6  3.4 - 5.0 g/dL Final   ??? Total Protein 12/07/2020 7.5  5.7 - 8.2 g/dL Final   ??? Total Bilirubin 12/07/2020 0.8  0.3 - 1.2 mg/dL Final   ??? AST 14/78/2956 40 (A) <=34 U/L Final   ??? ALT 12/07/2020 45  10 - 49 U/L Final   ??? Alkaline Phosphatase 12/07/2020 326 (A) 46 - 116 U/L Final   ??? Magnesium 12/07/2020 1.8  1.6 - 2.6 mg/dL Final   ??? LDH 21/30/8657 355 (A) 120 - 246 U/L Final   ??? Uric Acid 12/07/2020 11.6 (A) 3.7 - 9.2 mg/dL Final   ??? Phosphorus 12/07/2020 4.5  2.4 -  5.1 mg/dL Final   ??? WBC 16/01/9603 16.0 (A) 3.6 - 11.2 10*9/L Final   ??? RBC 12/07/2020 4.67  4.26 - 5.60 10*12/L Final   ??? HGB 12/07/2020 12.5 (A) 12.9 - 16.5 g/dL Final   ??? HCT 54/12/8117 38.5 (A) 39.0 - 48.0 % Final   ??? MCV 12/07/2020 82.4  77.6 - 95.7 fL Final   ??? MCH 12/07/2020 26.7  25.9 - 32.4 pg Final   ??? MCHC 12/07/2020 32.4  32.0 - 36.0 g/dL Final   ??? RDW 14/78/2956 16.2 (A) 12.2 - 15.2 % Final   ??? MPV 12/07/2020 9.6  6.8 - 10.7 fL Final   ??? Platelet 12/07/2020 170  150 - 450 10*9/L Final   ??? Neutrophils % 12/07/2020 66.0  % Final   ??? Lymphocytes % 12/07/2020 11.6  % Final   ??? Monocytes % 12/07/2020 20.6  % Final   ??? Eosinophils % 12/07/2020 1.6  % Final   ??? Basophils % 12/07/2020 0.2  % Final   ??? Absolute Neutrophils 12/07/2020 10.5 (A) 1.8 - 7.8 10*9/L Final   ??? Absolute Lymphocytes 12/07/2020 1.9  1.1 - 3.6 10*9/L Final   ??? Absolute Monocytes 12/07/2020 3.3 (A) 0.3 - 0.8 10*9/L Final   ??? Absolute Eosinophils 12/07/2020 0.3  0.0 - 0.5 10*9/L Final   ??? Absolute Basophils 12/07/2020 0.0  0.0 - 0.1 10*9/L Final   ??? Anisocytosis 12/07/2020 Slight (A) Not Present Final   ??? Hypochromasia 12/07/2020 Slight (A) Not Present Final   ??? Smear Review Comments 12/07/2020 See Comment (A) Undefined Final    slide reviewed.  [id number]     ??? Polychromasia 12/07/2020 Slight (A) Not Present Final   ??? Burr Cells 12/07/2020 Present (A) Not Present Final   ??? Acanthocytes 12/07/2020 Present (A) Not Present Final   ??? Toxic Vacuolation 12/07/2020 Present (A) Not Present Final   ??? Poikilocytosis 12/07/2020 Moderate (A) Not Present Final   ??? Amylase 12/07/2020 96  30 - 118 U/L Final   ??? Lipase 12/07/2020 62 (A) 12 - 53 U/L Final   No results displayed because visit has over 200 results.        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   -   A BCR/ABL1 interphase FISH assay shows an abnormal signal pattern, consistent with the 9;22 translocation documented by G-banding, in 95% of the 100 cells scored  -   BCR-ABL p210 transcripts were detected at a level of 35.897 IS % ratio in bone marrow.  -  See linked reports for associated Ancillary Studies.        This electronic signature is attestation that the pathologist personally reviewed the submitted material(s) and the final diagnosis reflects that evaluation.       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   -  A BCR/ABL1 interphase FISH assay shows an abnormal signal pattern, consistent with the 9;22 translocation documented by G-banding, in 95% of the 100 cells scored  -   BCR-ABL p210 transcripts were detected at a level of 35.897 IS % ratio in bone marrow.  -  See linked reports for associated Ancillary Studies.        This electronic signature is attestation that the pathologist personally reviewed the submitted material(s) and the final diagnosis reflects that evaluation.

## 2020-12-11 NOTE — Unmapped (Signed)
DIVISION OF CARDIOLOGY  [employer] [place]        Date of Service: 12/11/2020      PCP: Referring Provider:   Salem Caster, MD  [address]  [place] [address]  Phone: [phone]  Fax: [phone] [name], FNP  [address]  [place],  [place] [address]  Phone: [phone]  Fax: [phone]     ______________________________________________________________________________________________      ASSESSMENT AND PLAN:       1. 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   - he was lost to follow up and presented with syncope and volume overload.  He has had aflutter ablation and is doing well.  He is volume overloaded today.  We will increase his diuretic dose.  I also stopped his Norvasc which may cause LE edema.  - will obtain a Harmony valve CT to work him up for transcatheter PVR.    2.  Aflutter  - s/p Aflutter ablation.  He is anticoagulated.      3.  CHF - non-ischemic.  Per patient, he had a cardiac cath at [facility] that did not show significant CAD.      4.  PFO - from previous echos.  No history of stroke.        [name], MD,  [facility], [facility]  Interventional Cardiology  Associate Professor of Medicine  [place] of [facility] at [facility]    ______________________________________________________________________________________________      SUBJECTIVE:     HISTORY OF PRESENT ILLNESS:    Dear Dr. [name], FNP  [address]  [place],  [place] [address],      I had the pleasure of seeing [name] in our Adult Congenital Heart Disease (ACHD) Clinic today for follow up referred to [place] by Dr. Berline Lopes in regards to [name]' history of congenital pulmonary stenosis s/p surgical valvotomy as a child.      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  He also has DM, CKD and HFrEF with last LVEF of 45-50% (reported) and is in Aflutter at the time of the visit.      Overall, [name] state that he has been doing better lately.  He was diagnosed with CML back in 2018 and was started on Gleevec at the time.  He became volume overloaded and experienced quite a bit of orthopnea which led to him being admitted to [facility] about 6 times since September of 2020.  It seems that he has been on a better HF regimen/diuresis since March of 2021 and has not been hospitalized since then.  Overall, he appears well today.  He had an echocardiogram that was read as an EF of 45-50% with moderate PI.  His PI is severe, and has severely dilated RV and RV dysfunction.     We had set him up for cardioversion, but he was found to have a LAA clot.  He then got lost to follow up for about a year with [place] - and he was primarily going to [facility] for his care.  He presented to [place] last month with AFlutter with  RVR and in decompensated HF.  We discussed managements - options and he had an aflutter ablation while he was in the hospital.  He is volume overloaded today.  And we will increase his diuretics and re-evaluate him next week.      His creatinine is 3.  I think we can forgo the cardiac MRI but we will likely need to get the Harmony CT to evaluate his pulmonic valve.  We can do this with only 40cc of contrast.    He reports no lightheadedness, dizziness, palpitations, or syncope.  Patient also denies any overt heart failure symptoms such as orthopnea, paroxysmal nocturnal dyspnea, or worsening lower extremity edema.      [name] states compliance with his medications and denies any untoward side effects from it.          CARDIOVASCULAR HISTORY AND PROCEDURES    Cardiovascular Studies Date Comments     ECG 2021 Aflutter with variable block at 102, RBBB   Echo 2021 EF of 45-50% / Severe RV dilatation and decreased function.  Moderate PI with PASP of 45 mm Hg.   Stress test 2021    Cardiac catheterization 2021    Electrophysiology      Cardiovascular Surgery 1975 Surgical pulmonary valvotomy   Peripheral Vascular Studies         I have reviewed the cardiology tests personally.      PAST MEDICAL HISTORY  Past Medical History:   Diagnosis Date   ??? CKD (chronic kidney disease) stage 3, GFR 30-59 ml/min (CMS-HCC)    ??? CML (chronic myelocytic leukemia) (CMS-HCC)    ??? HFrEF (heart failure with reduced ejection fraction) (CMS-HCC)    ??? Paroxysmal atrial fibrillation (CMS-HCC)    ??? PFO (patent foramen ovale)    ??? Right atrial thrombus    ??? T2DM (type 2 diabetes mellitus) (CMS-HCC)        ALLERGIES  Patient has no known allergies.      CURRENT MEDICATIONS  Current Outpatient Medications   Medication Sig Dispense Refill   ??? acetaminophen (TYLENOL) 325 MG tablet Take 650 mg by mouth every six (6) hours as needed for pain.     ??? allopurinoL (ZYLOPRIM) 100 MG tablet Take 2 tablets (200 mg total) by mouth daily. 60 tablet 0   ??? amLODIPine (NORVASC) 10 MG tablet Take 10 mg by mouth daily.     ??? apixaban (ELIQUIS) 5 mg Tab Take 5 mg by mouth Two (2) times a day.     ??? asciminib (SCEMBLIX) 40 mg tablet Take 1 tablet (40 mg total) by mouth Two (2) times a day. Take on an empty stomach, at least 1 hour before or 2 hours after a meal. Swallow tablets whole. Do not break, crush, or chew the tablets. 60 tablet 11   ??? blood sugar diagnostic Strp      ??? blood-glucose meter Misc      ??? folic acid (FOLVITE) 1 MG tablet Take 1 tablet (1 mg total) by mouth in the morning. 90 tablet 3   ??? HUMULIN R REGULAR U-100 INSULN 100 unit/mL injection INJECT 15 UNITS SUBCUTANEOUSLY THREE TIMES DAILY BEFORE MEAL(S)     ??? insulin glargine (BASAGLAR, LANTUS) 100 unit/mL (3 mL) injection pen Inject 0.32 mL (32 Units total) under the skin nightly. 15 mL 12   ??? insulin syringe-needle U-100 0.5 mL 29 gauge x 1/2 (12 mm) Syrg Use to inject 4 times daily as directed.     ??? lancets  Misc by Miscellaneous route.     ??? metoprolol succinate (TOPROL-XL) 100 MG 24 hr tablet Take 1 tablet (100 mg total) by mouth daily. 30 tablet 1   ??? ONETOUCH VERIO TEST STRIPS Strp USE 1 STRIP TO CHECK GLUCOSE TWICE DAILY     ??? OZEMPIC 0.25 mg or 0.5 mg(2 mg/1.5 mL) PnIj injection Inject 0.25 mg under the skin every seven (7) days.     ??? pantoprazole (PROTONIX) 40 MG tablet Take 40 mg by mouth daily.     ??? pen needle, diabetic 29 gauge x 1/2 (12 mm) Ndle Please substitute for cheapest generic available. Diagnosis E11.9.Z79.4 110 each 0   ??? pregabalin (LYRICA) 100 MG capsule Take 100 mg by mouth Three (3) times a day.     ??? rosuvastatin (CRESTOR) 20 MG tablet Take 1 tablet (20 mg total) by mouth daily. 90 tablet 0   ??? torsemide (DEMADEX) 20 MG tablet Take 20 mg by mouth two (2) times a day. As needed     ??? BINAXNOW COVID-19 AG SELF TEST Kit        No current facility-administered medications for this visit.       FAMILY HISTORY  Negative for early CAD    SOCIAL HISTORY  He  reports that he quit smoking about 2 years ago. His smoking use included cigarettes. He has a 3.00 pack-year smoking history. He has never used smokeless tobacco. He reports that he does not drink alcohol and does not use drugs.      REVIEW OF SYSTEMS    Review of Systems - 10 systems were reviewed and negative except as noted in HPI    Constitutional: negative for - chills, fatigue, fever or night sweats  ENT ROS: negative for - vertigo or visual changes  Hematological and Lymphatic ROS: negative for - blood transfusions, jaundice, night sweats or swollen lymph nodes  Endocrine ROS: negative for - skin changes or temperature intolerance  Respiratory ROS: no cough, shortness of breath, or wheezing negative for - hemoptysis, orthopnea, shortness of breath, tachypnea or wheezing  Cardiovascular ROS:  As reported in HPI  Gastrointestinal ROS: negative for - abdominal pain, hematemesis, melena, nausea/vomiting or swallowing difficulty/pain  Genito-Urinary ROS: negative for - dysuria, incontinence or nocturia  Musculoskeletal ROS: negative for - gait disturbance, joint pain, muscle pain or muscular weakness  Neurological ROS: negative for - behavioral changes, bowel and bladder control changes, headaches, seizures or speech problems    PHYSICAL EXAM     Physical Exam  BP 132/71 (BP Site: L Arm, BP Position: Sitting, BP Cuff Size: Medium)  - Pulse 95  - Ht 188 cm (6' 2)  - Wt (!) 119.4 kg (263 lb 3.2 oz)  - SpO2 96%  - BMI 33.79 kg/m??    Wt Readings from Last 3 Encounters:   12/11/20 (!) 119.4 kg (263 lb 3.2 oz)   12/07/20 (!) 120.2 kg (264 lb 14.4 oz)   12/04/20 (!) 120.2 kg (265 lb 1.7 oz)       General:  Alert, no distress.   Eyes:  Intact, sclerae anicteric.   Ears, nose, mouth: Moist mucous membranes.Supple, no carotid bruit.    Respiratory:   CTAB bilaterally with normal WOB.   Cardiovascular:  No carotid bruit, no JVD, RRR 2/6 systolic murmur at RUSB   Gastrointestinal:   Normal bowel sounds, soft, NTND.   Musculoskeletal: Normal strength   Skin: Warm, well perfused.   Neurologic: No focal deficits.  Most recent labs   Lab Results   Component Value Date    Sodium 136 12/07/2020    Potassium 4.7 12/07/2020    Chloride 104 12/07/2020    CO2 21.0 12/07/2020    BUN 54 (H) 12/07/2020    Creatinine 3.20 (H) 12/07/2020    Magnesium 1.8 12/07/2020     Lab Results   Component Value Date    HGB 12.5 (L) 12/07/2020    Hemoglobin, POC 12.5 (L) 11/30/2020    MCV 82.4 12/07/2020    Platelet 170 12/07/2020     Lab Results   Component Value Date    Hemoglobin A1C >14.0 (H) 11/11/2020    TSH 3.009 11/24/2020    PRO-BNP 2,260.0 (H) 08/26/2019    INR 1.14 11/12/2020         *Patient note was created using dragon dictation. Any errors in syntax or proofreading may not have been identified and edited on initial review prior to signing this note.    Thank you very much for allowing me the opportunity to participate in the care of [name], who is a delightful patient.  Please do not hesitate to call me if you have any questions.      Sincerely,    [name], MD, [facility], [facility]  Interventional Cardiology  Associate Professor of Medicine  [place] [facility] [place] at [facility]

## 2020-12-11 NOTE — Unmapped (Addendum)
Increase Torsemide to 40mg  twice a day.      Stop Amlodipine.    Obtain EKG, BMP - will obtain CT scan soon.

## 2020-12-18 ENCOUNTER — Ambulatory Visit: Admit: 2020-12-18 | Discharge: 2020-12-19 | Payer: MEDICARE

## 2020-12-18 DIAGNOSIS — I480 Paroxysmal atrial fibrillation: Principal | ICD-10-CM

## 2020-12-18 DIAGNOSIS — I513 Intracardiac thrombosis, not elsewhere classified: Principal | ICD-10-CM

## 2020-12-18 DIAGNOSIS — Q211 Atrial septal defect: Principal | ICD-10-CM

## 2020-12-18 DIAGNOSIS — Q221 Congenital pulmonary valve stenosis: Principal | ICD-10-CM

## 2020-12-18 DIAGNOSIS — I5020 Unspecified systolic (congestive) heart failure: Principal | ICD-10-CM

## 2020-12-18 NOTE — Unmapped (Signed)
Doing much better.  Decrease Torsemide to 40mg  / 20mg .    Obtain CT scan.

## 2020-12-18 NOTE — Unmapped (Signed)
DIVISION OF CARDIOLOGY  [employer] [place] [place]        Date of Service: 12/18/2020      PCP: Referring Provider:   Salem Caster, MD  [address]  [place] [address]  Phone: [phone]  Fax: [phone] [name], FNP  [address]  [place],  [place] [address]  Phone: [phone]  Fax: [phone]     ______________________________________________________________________________________________      ASSESSMENT AND PLAN:       1. 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   - he was lost to follow up and presented with syncope and volume overload.  He has had aflutter ablation and is doing well.    - he is doing much better from a volume standpoint.  We will decrease his diuretics to 40 torsemide AM/20 PM.  - will obtain a Harmony valve CT to work him up for transcatheter PVR.    2.  Aflutter  - s/p Aflutter ablation.  He is anticoagulated.      3.  CHF - non-ischemic.  Per patient, he had a cardiac cath at [facility] that did not show significant CAD.      4.  PFO - from previous echos.  No history of stroke.        [name], MD,  [facility], [facility]  Interventional Cardiology  Associate Professor of Medicine  [place] of [facility] at [facility]    ______________________________________________________________________________________________      SUBJECTIVE:     HISTORY OF PRESENT ILLNESS:    Dear Dr. [name], FNP  [address]  [place],  [place] [address],      I had the pleasure of seeing Colin Hunter in our Adult Congenital Heart Disease (ACHD) Clinic today for follow up referred to [place] by Dr. Berline Lopes in regards to [name]' history of congenital pulmonary stenosis s/p surgical valvotomy as a child.      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  He also has DM, CKD and HFrEF with last LVEF of 45-50% (reported) and is in Aflutter at the time of the visit.      Overall, [name] state that he has been doing better lately.  He was diagnosed with CML back in 2018 and was started on Gleevec at the time.  He became volume overloaded and experienced quite a bit of orthopnea which led to him being admitted to [facility] about 6 times since September of 2020.  It seems that he has been on a better HF regimen/diuresis since March of 2021 and has not been hospitalized since then.  Overall, he appears well today.  He had an echocardiogram that was read as an EF of 45-50% with moderate PI.  His PI is severe, and has severely dilated RV and RV dysfunction.     We had set him up for cardioversion, but he was found to have a LAA clot.  He then got lost to follow up for about a year with [place] - and he was primarily going to [facility] for his care.  He presented to [place] last month with AFlutter with  RVR and in decompensated HF.  We discussed managements - options and he had an aflutter ablation while he was in the hospital.  He is volume overloaded today.  And we will increase his diuretics and re-evaluate him next week.      His creatinine is 3.  I think we can forgo the cardiac MRI but we will likely need to get the Harmony CT to evaluate his pulmonic valve.  We can do this with only 40cc of contrast.    He reports no lightheadedness, dizziness, palpitations, or syncope.  Patient also denies any overt heart failure symptoms such as orthopnea, paroxysmal nocturnal dyspnea, or worsening lower extremity edema.      Colin Hunter states compliance with his medications and denies any untoward side effects from it.          CARDIOVASCULAR HISTORY AND PROCEDURES    Cardiovascular Studies Date Comments     ECG 2021 Aflutter with variable block at 102, RBBB   Echo 2021 EF of 45-50% / Severe RV dilatation and decreased function.  Moderate PI with PASP of 45 mm Hg.   Stress test 2021    Cardiac catheterization 2021    Electrophysiology      Cardiovascular Surgery 1975 Surgical pulmonary valvotomy   Peripheral Vascular Studies         I have reviewed the cardiology tests personally.      PAST MEDICAL HISTORY  Past Medical History:   Diagnosis Date   ??? CKD (chronic kidney disease) stage 3, GFR 30-59 ml/min (CMS-HCC)    ??? CML (chronic myelocytic leukemia) (CMS-HCC)    ??? HFrEF (heart failure with reduced ejection fraction) (CMS-HCC)    ??? Paroxysmal atrial fibrillation (CMS-HCC)    ??? PFO (patent foramen ovale)    ??? Right atrial thrombus    ??? T2DM (type 2 diabetes mellitus) (CMS-HCC)        ALLERGIES  Patient has no known allergies.      CURRENT MEDICATIONS  Current Outpatient Medications   Medication Sig Dispense Refill   ??? acetaminophen (TYLENOL) 325 MG tablet Take 650 mg by mouth every six (6) hours as needed for pain.     ??? allopurinoL (ZYLOPRIM) 100 MG tablet Take 2 tablets (200 mg total) by mouth daily. 60 tablet 0   ??? amLODIPine (NORVASC) 10 MG tablet Take 10 mg by mouth daily.     ??? apixaban (ELIQUIS) 5 mg Tab Take 5 mg by mouth Two (2) times a day.     ??? asciminib (SCEMBLIX) 40 mg tablet Take 1 tablet (40 mg total) by mouth Two (2) times a day. Take on an empty stomach, at least 1 hour before or 2 hours after a meal. Swallow tablets whole. Do not break, crush, or chew the tablets. 60 tablet 11   ??? BINAXNOW COVID-19 AG SELF TEST Kit      ??? blood sugar diagnostic Strp      ??? blood-glucose meter Misc      ??? folic acid (FOLVITE) 1 MG tablet Take 1 tablet (1 mg total) by mouth in the morning. 90 tablet 3   ??? HUMULIN R REGULAR U-100 INSULN 100 unit/mL injection INJECT 15 UNITS SUBCUTANEOUSLY THREE TIMES DAILY BEFORE MEAL(S)     ??? insulin syringe-needle U-100 0.5 mL 29 gauge x 1/2 (12 mm) Syrg Use to inject 4 times daily as directed.     ??? lancets Misc by Miscellaneous route.     ??? metoprolol succinate (TOPROL-XL) 100 MG  24 hr tablet Take 1 tablet (100 mg total) by mouth daily. 30 tablet 1   ??? ONETOUCH VERIO TEST STRIPS Strp USE 1 STRIP TO CHECK GLUCOSE TWICE DAILY     ??? OZEMPIC 0.25 mg or 0.5 mg(2 mg/1.5 mL) PnIj injection Inject 0.25 mg under the skin every seven (7) days.     ??? pantoprazole (PROTONIX) 40 MG tablet Take 40 mg by mouth daily.     ??? pen needle, diabetic 29 gauge x 1/2 (12 mm) Ndle Please substitute for cheapest generic available. Diagnosis E11.9.Z79.4 110 each 0   ??? rosuvastatin (CRESTOR) 20 MG tablet Take 1 tablet (20 mg total) by mouth daily. 90 tablet 0   ??? torsemide (DEMADEX) 20 MG tablet Take 20 mg by mouth two (2) times a day. As needed     ??? insulin glargine (BASAGLAR, LANTUS) 100 unit/mL (3 mL) injection pen Inject 0.32 mL (32 Units total) under the skin nightly. 15 mL 12     No current facility-administered medications for this visit.       FAMILY HISTORY  Negative for early CAD    SOCIAL HISTORY  He  reports that he quit smoking about 2 years ago. His smoking use included cigarettes. He has a 3.00 pack-year smoking history. He has never used smokeless tobacco. He reports that he does not drink alcohol and does not use drugs.      REVIEW OF SYSTEMS    Review of Systems - 10 systems were reviewed and negative except as noted in HPI    Constitutional: negative for - chills, fatigue, fever or night sweats  ENT ROS: negative for - vertigo or visual changes  Hematological and Lymphatic ROS: negative for - blood transfusions, jaundice, night sweats or swollen lymph nodes  Endocrine ROS: negative for - skin changes or temperature intolerance  Respiratory ROS: no cough, shortness of breath, or wheezing negative for - hemoptysis, orthopnea, shortness of breath, tachypnea or wheezing  Cardiovascular ROS:  As reported in HPI  Gastrointestinal ROS: negative for - abdominal pain, hematemesis, melena, nausea/vomiting or swallowing difficulty/pain  Genito-Urinary ROS: negative for - dysuria, incontinence or nocturia  Musculoskeletal ROS: negative for - gait disturbance, joint pain, muscle pain or muscular weakness  Neurological ROS: negative for - behavioral changes, bowel and bladder control changes, headaches, seizures or speech problems    PHYSICAL EXAM     Physical Exam  BP 129/86 (BP Site: L Arm, BP Position: Sitting)  - Pulse 88  - Ht 188 cm (6' 2)  - Wt (!) 116.6 kg (257 lb)  - SpO2 93%  - BMI 33.00 kg/m??    Wt Readings from Last 3 Encounters:   12/18/20 (!) 116.6 kg (257 lb)   12/11/20 (!) 119.4 kg (263 lb 3.2 oz)   12/07/20 (!) 120.2 kg (264 lb 14.4 oz)       General:  Alert, no distress.   Eyes:  Intact, sclerae anicteric.   Ears, nose, mouth: Moist mucous membranes.Supple, no carotid bruit.    Respiratory:   CTAB bilaterally with normal WOB.   Cardiovascular:  No carotid bruit, no JVD, RRR 2/6 systolic murmur at RUSB   Gastrointestinal:   Normal bowel sounds, soft, NTND.   Musculoskeletal: Normal strength   Skin: Warm, well perfused.   Neurologic: No focal deficits.       Most recent labs   Lab Results   Component Value Date    Sodium 138 12/11/2020    Potassium 4.4 12/11/2020  Chloride 108 (H) 12/11/2020    CO2 22.0 12/11/2020    BUN 52 (H) 12/11/2020    Creatinine 2.85 (H) 12/11/2020    Magnesium 1.8 12/07/2020     Lab Results   Component Value Date    HGB 12.5 (L) 12/07/2020    Hemoglobin, POC 12.5 (L) 11/30/2020    MCV 82.4 12/07/2020    Platelet 170 12/07/2020     Lab Results   Component Value Date    Hemoglobin A1C >14.0 (H) 11/11/2020    TSH 3.009 11/24/2020    PRO-BNP 2,260.0 (H) 08/26/2019    INR 1.14 11/12/2020         *Patient note was created using dragon dictation. Any errors in syntax or proofreading may not have been identified and edited on initial review prior to signing this note.    Thank you very much for allowing me the opportunity to participate in the care of Colin Hunter, who is a delightful patient. Please do not hesitate to call me if you have any questions.      Sincerely,    [name], MD, [facility], [facility]  Interventional Cardiology  Associate Professor of Medicine  [place] of [place] at [facility]

## 2020-12-18 NOTE — Unmapped (Signed)
ACHD Intake Questionnaire     Since his last visit, Colin Hunter has had an increase of: dizziness and swelling- both dizziness and swelling have improved since last week   Exercise: Yes; swimming, not recently  Energy level:excellent  Recent changes in weight:Yes;  Decrease- since medication change    Sexual Health  Sexually active: Yes   Sex drive:good   Contraceptive type: None    Routine Care  PCP: Salem Caster, MD  Dentist: needs a new dentist   Last cleaning: more than a year since his last cleaning    Mental Health Screening  PHQ-2:0  GAD-2: 0    Vaccination History  COVID vaccine: Fully vaccinated and boosted  Flu vaccine:not Flu season

## 2020-12-22 ENCOUNTER — Institutional Professional Consult (permissible substitution): Admit: 2020-12-22 | Discharge: 2020-12-23 | Payer: MEDICARE

## 2020-12-22 MED ORDER — SIMETHICONE 80 MG CHEWABLE TABLET
ORAL_TABLET | Freq: Four times a day (QID) | ORAL | 2 refills | 15 days | Status: CP | PRN
Start: 2020-12-22 — End: ?

## 2020-12-22 NOTE — Unmapped (Signed)
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 Colin Hunter today to check in related to how he's doing after recently starting asciminib (delivered 9/1). He feels well, denies nausea, diarrhea, constipation, skin irritation, chest pain, SOB, dizziness, abdominal pain. He does note some increased gas/belching he thinks could be related to the new med and has also been changing his diet to control. No other active issues he discusses. Also states he has been following up with cardiology as planned and has some upcoming appointments. Noted new diuretic dose (torsemide 40 mg in the AM and 20 mg in the PM) and norvasc discontinued. Patient mentions appointment for CT on 9/28.    He forgot to go to [facility] to get labs done before I spoke with him today but states he'd like to go tomorrow.     Oncologic History:  Oncology History Overview Note   CML, chronic phase:   Leukocytosis to ~30 at time of diagnosis with normal platelet count and Hgb.     Bone marrow biopsy 06/26/2015:   CML, chronic phase (2% blasts, 2% promyelocytes)    Aspirate Smear:   The aspirate smear is hypercellular with rare small spicules.   Myeloid elements are increased with full maturation, without significant   cytologic atypia. Erythroid elements are markedly decreased, without significant cytologic atypia. The myeloid to erythroid ratio is >10:1. Many small hypolobated megakaryocytes are present. Storage iron is present; ringed sideroblasts are not identified.     A differential count of 200 nucleated cells shows the following   percentages:   2 Blasts   2 Promyelocytes   31 Myelocytes/Metamyelocytes   51 Bands/Neutrophils   3 Monocytes   3 Eosinophils   1 Basophils   1 Lymphocytes   0 Plasma cells   6 NRBCs     Biopsy:   H&E and PAS stained sections show unremarkable trabecular bone and markedly hypercellular marrow for age (95% cellularity). The myeloid to erythroid ratio is >5:1. Erythroid elements are decreased with full maturation. Myeloid elements are markedly increased with full maturation.   Megakaryocytes are increased with many small hypolobated forms.   Scattered small lymphocytes and plasma cells account for <5% of   cellularity. A reticulin stain shows grade 1 of 3 fibrosis (European Consensus System).     Flow cytometry:   Flow cytometry performed on the bone marrow aspirate ([id number])   demonstrates a non-discrete population of blasts and maturing   myelomonocytic cells within the blast gate (<2% of total events).     CBC:   A tandem CBC shows: WBC 27.9 (55.7% neutrophils, 0.9% bands, 0.9%   metamyelocytes, 11.3% myelocytes, 10.4% lymphocytes, 13.2% monocytes, 1.0%   eosinophils, 4.7% basophils), Hgb 13.4, MCV 87, Plt 224.     Treatment:   Imatinib, 400mg , 06/26/2015-05/17/2019  Bosutiib, 400 mg 10/04/2019 -     BCR/ABL testing:   06/2015: 87.086%  07/17/2015: 140.922%  08/18/2015: 75.693%  12/08/2015: 2.144%  01/07/2016: 2.450%  03/29/2016: 2.008%  07/26/2016: 2.871%; Kinase domain mutation testing negative  10/28/2016: 1.247%  01/05/2017: 0.641%  03/14/2017: 0.348%; Kinase domain mutation testing negative  06/25/19: Result not available   07/26/19: Result not available      07/26/19 FISH: positive t(9;22)  in ~90.5% of cells     Final Diagnosis   Date Value Ref Range Status   08/20/2019   Final    Bone marrow, right iliac, aspiration and biopsy  -  Hypercellular bone marrow (95%) involved by chronic myeloid leukemia, BCR-ABL1-positive, chronic phase (2% blasts by manual aspirate differential)  -  See linked reports for associated Ancillary Studies.      This electronic signature is attestation that the pathologist personally reviewed the submitted material(s) and the final diagnosis reflects that evaluation. 08/12/19 BCR/ABL: 28.427 %  08/20/19 BCR/ABL: 35.897%    08/12/19 TKI Resistance Testing: Negative    10/04/19: started bosutinib 400 mg daily    Current treatment:   Bosutinib 400 mg daily     Chronic myeloid leukemia (CMS-HCC)   06/26/2015 Initial Diagnosis    Chronic myeloid leukemia (CMS-HCC)     06/26/2015 - 05/17/2019 Chemotherapy    Imatinib, 400mg  daily; discontinued due to worsening edema     10/04/2019 -  Chemotherapy    Bosutinib 400mg          Weight and Vitals:  Wt Readings from Last 3 Encounters:   12/18/20 (!) 116.6 kg (257 lb)   12/11/20 (!) 119.4 kg (263 lb 3.2 oz)   12/07/20 (!) 120.2 kg (264 lb 14.4 oz)     Temp Readings from Last 3 Encounters:   12/07/20 36.5 ??C (97.7 ??F) (Temporal)   12/04/20 36.7 ??C (98.1 ??F) (Oral)   11/12/20 36.6 ??C (97.8 ??F) (Oral)     BP Readings from Last 3 Encounters:   12/18/20 129/86   12/11/20 132/71   12/07/20 131/87     Pulse Readings from Last 3 Encounters:   12/18/20 88   12/11/20 95   12/07/20 95       Pertinent Labs:  No visits with results within 1 Day(s) from this visit.   Latest known visit with results is:   Office Visit on 12/11/2020   Component Date Value Ref Range Status   ??? Sodium 12/11/2020 138  135 - 145 mmol/L Final   ??? Potassium 12/11/2020 4.4  3.4 - 4.8 mmol/L Final   ??? Chloride 12/11/2020 108 (A) 98 - 107 mmol/L Final   ??? CO2 12/11/2020 22.0  20.0 - 31.0 mmol/L Final   ??? Anion Gap 12/11/2020 8  5 - 14 mmol/L Final   ??? BUN 12/11/2020 52 (A) 9 - 23 mg/dL Final   ??? Creatinine 12/11/2020 2.85 (A) 0.60 - 1.10 mg/dL Final   ??? BUN/Creatinine Ratio 12/11/2020 18   Final   ??? eGFR CKD-EPI (2021) Male 12/11/2020 26 (A) >=60 mL/min/1.65m2 Final    eGFR calculated with CKD-EPI 2021 equation in accordance with [employer] and [employer] of Nephrology Task Force recommendations.   ??? Glucose 12/11/2020 194 (A) 70 - 179 mg/dL Final   ??? Calcium 16/01/9603 9.8  8.7 - 10.4 mg/dL Final   ??? EKG Ventricular Rate 12/11/2020 89  BPM Incomplete   ??? EKG Atrial Rate 12/11/2020 89  BPM Incomplete   ??? EKG P-R Interval 12/11/2020 180  ms Incomplete   ??? EKG QRS Duration 12/11/2020 164  ms Incomplete   ??? EKG Q-T Interval 12/11/2020 412  ms Incomplete   ??? EKG QTC Calculation 12/11/2020 501  ms Incomplete   ??? EKG Calculated P Axis 12/11/2020 55  degrees Incomplete   ??? EKG Calculated R Axis 12/11/2020 15  degrees Incomplete   ??? EKG Calculated T Axis 12/11/2020 57  degrees Incomplete   ???  QTC Fredericia 12/11/2020 469  ms Incomplete       Allergies: No Known Allergies    Drug Interactions:  CYP2C9 Inhibitors (Moderate) such as asciminib may increase the serum concentration of torsemide. Cardiology can titrate to effect/avoid toxicity      Current Medications:  Current Outpatient Medications   Medication Sig Dispense Refill   ??? allopurinoL (ZYLOPRIM) 100 MG tablet Take 2 tablets (200 mg total) by mouth daily. 60 tablet 0   ??? apixaban (ELIQUIS) 5 mg Tab Take 5 mg by mouth Two (2) times a day.     ??? asciminib (SCEMBLIX) 40 mg tablet Take 1 tablet (40 mg total) by mouth Two (2) times a day. Take on an empty stomach, at least 1 hour before or 2 hours after a meal. Swallow tablets whole. Do not break, crush, or chew the tablets. 60 tablet 11   ??? folic acid (FOLVITE) 1 MG tablet Take 1 tablet (1 mg total) by mouth in the morning. 90 tablet 3   ??? HUMULIN R REGULAR U-100 INSULN 100 unit/mL injection INJECT 15 UNITS SUBCUTANEOUSLY THREE TIMES DAILY BEFORE MEAL(S)     ??? insulin glargine (BASAGLAR, LANTUS) 100 unit/mL (3 mL) injection pen Inject 0.32 mL (32 Units total) under the skin nightly. 15 mL 12   ??? metoprolol succinate (TOPROL-XL) 100 MG 24 hr tablet Take 1 tablet (100 mg total) by mouth daily. 30 tablet 1   ??? OZEMPIC 0.25 mg or 0.5 mg(2 mg/1.5 mL) PnIj injection Inject 0.25 mg under the skin every seven (7) days.     ??? pantoprazole (PROTONIX) 40 MG tablet Take 40 mg by mouth daily.     ??? pregabalin (LYRICA) 100 MG capsule Take 100 mg by mouth Three (3) times a day.     ??? rosuvastatin (CRESTOR) 20 MG tablet Take 1 tablet (20 mg total) by mouth daily. 90 tablet 0   ??? torsemide (DEMADEX) 20 MG tablet Take by mouth two (2) times a day. 40 mg in the morning and 20 mg in the evening     ??? acetaminophen (TYLENOL) 325 MG tablet Take 650 mg by mouth every six (6) hours as needed for pain.     ??? amLODIPine (NORVASC) 10 MG tablet Take 10 mg by mouth daily.     ??? BINAXNOW COVID-19 AG SELF TEST Kit      ??? blood sugar diagnostic Strp      ??? blood-glucose meter Misc      ??? insulin syringe-needle U-100 0.5 mL 29 gauge x 1/2 (12 mm) Syrg Use to inject 4 times daily as directed.     ??? lancets Misc by Miscellaneous route.     ??? ONETOUCH VERIO TEST STRIPS Strp USE 1 STRIP TO CHECK GLUCOSE TWICE DAILY     ??? pen needle, diabetic 29 gauge x 1/2 (12 mm) Ndle Please substitute for cheapest generic available. Diagnosis E11.9.Z79.4 110 each 0   ??? simethicone (MYLICON) 80 MG chewable tablet Chew 1 tablet (80 mg total) every six (6) hours as needed for flatulence. 60 tablet 2     No current facility-administered medications for this visit.       Adherence: Missed one dose since starting due to forgetting. Encouraged perfect adherence. 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 Other than CML, Colin Hunter is also working with cardiology regarding his pulmonic stenosis, Aflutter, and  CHF. He is on anticoagulation. He feels well as it relates to asciminib     Plan:   1) CML  - Continue asciminib 40 mg BID   - See above comments in adherence related to option to transition to 80 mg daily in the future  - Should go get labs tomorrow 9/14 as he did not get those prior to today's visit    2) TLS prophylaxis/ history of gout  - Continue on increased allopurinol dose of 200 mg daily given recently elevated uric acid and CKD. Can likely increase dose further but can be followed by PCP as low risk for CML related TLS at this point    3) Supportive care  - Sent Rx for simethicone to local pharmacy, he can take every 6 hours prn for gas/belching      Manfred Arch, PharmD, BCOP, CPP  Pager: [phone]

## 2020-12-30 NOTE — Unmapped (Signed)
error 

## 2020-12-31 ENCOUNTER — Ambulatory Visit: Admit: 2020-12-31 | Discharge: 2021-01-01 | Payer: MEDICARE

## 2020-12-31 DIAGNOSIS — N183 Stage 3 chronic kidney disease, unspecified whether stage 3a or 3b CKD (CMS-HCC): Principal | ICD-10-CM

## 2020-12-31 DIAGNOSIS — Z794 Long term (current) use of insulin: Principal | ICD-10-CM

## 2020-12-31 DIAGNOSIS — I5020 Unspecified systolic (congestive) heart failure: Principal | ICD-10-CM

## 2020-12-31 DIAGNOSIS — E119 Type 2 diabetes mellitus without complications: Principal | ICD-10-CM

## 2020-12-31 DIAGNOSIS — I10 Essential (primary) hypertension: Principal | ICD-10-CM

## 2020-12-31 NOTE — Unmapped (Addendum)
It was great to meet you in clinic today!    Diabetes  Your A1c was 9.9 today, down significantly from >14 about 6 weeks ago. Keep up the great work with taking your medications daily. Some minor changes that will help you keep making progress:  Decrease the nightly lantus from 32 units to 27 units  Increase the ozempic weekly injection from 0.25mg  to 0.5mg  every week  Continue logging your sugars to bring to your endocrinology appointment in November and for me in January    Blood pressure  No need to restart losartan. Your blood pressure is looking great today at 119/76.     R Back pain  Sounds like a muscle strain. Take tylenol and use a heating pad. If it doesn't resolve in the next few weeks, call our clinic and I can refer you to physical therapy. I would recommend not lifting anything heavy and taking it easy the next few weeks, just light exercise and stretching    I will see you in a few months. If any questions or problems in the meantime, feel free to reach out by [place] or phone call.

## 2020-12-31 NOTE — Unmapped (Signed)
Internal Medicine Initial Visit    Assessment/Plan:     Colin Hunter is a 50 y.o.??male??with PMHx of CML on asciminib, HFrEF??(EF 40% in 11/2020), RV dysfunction, Congenital Pulmonic Stenosis, Afib/Flutter, Uncontrolled T2DM, and CKD3 who presents today to establish care.          1. Type 2 diabetes mellitus treated with insulin (CMS-HCC)    2. Heart failure with reduced ejection fraction (CMS-HCC)    3. Essential hypertension    4. Stage 3 chronic kidney disease, unspecified whether stage 3a or 3b CKD (CMS-HCC)       Uncontrolled insulin-dependent DM  A1c today 9.9. A1c at 6 weeks ago was >14. Attributes change to taking daily medications whereas in the past he was not doing so. Sugars run low 100s to 200 now, not having any more in the 300s.   -Cont humalin 15u at meals   -Increase ozempic to 0.5 weekly from 0.25   -Decrease lantus to 27u nightly from 32u   -Cont rosuvastatin 20mg  daily  -Cont lyrica 100mg  TID     HTN, well-controlled  Today is 119/76 and has been in the normal range for his past few appointments.  We will not restart losartan at this time.    CKD3, worsening  Recent Cr 2.85.  Likely due to uncontrolled diabetes, hypertension, and cardiorenal syndrome.  -Follow-up with nephrology in October    CML   Has not tolerated imatinib in the past.  -Seen by [facility] 12/21/20, seen again on 9/26  -Started asciminib 40mg  BID on 12/10/20  -Continue allopurinol 200mg  daily  -Continue folate daily    HfrEF, improved  Reportedly non-ischemic. Per patient, he had a cardiac cath at [facility] that did not show significant CAD. Last LVEF of 40%. He was diagnosed with CML back in 2018 and was started on Gleevec at the time.  He became volume overloaded and experienced quite a bit of orthopnea which led to him being admitted to [facility] about 6 times since September of 2020. He has been stabilized on a better regimen since then.  -Seen by cardiology 12/18/20  -ContinueToprol 100mg  daily  -Continue 40mg  torsemide AM + 20mg  PM     Aflutter - S/p ablation  S/p Aflutter ablation (11/2020).  -Continue eliquis 5mg  BID  -Continue toprol XL 100mg  daily    Pulmonic stenosis, congenital - RV dilation   Had surgical valvotomy in 1975 as a kid and this has led to chronic pulmonary valve insufficiency and dilated RV/dysfunction.    -Has scheduled valve CT to work up for transcatheter PVR per cards  ??  PFO, stable  From previous echos.  No history of stroke.      GERD  Continue 40mg  protonix daily    Staffed with Dr. Madelon Lips, seen and discussed    Return in about 3 months (around 04/01/2021) for Please schedule in my clinic: 1/5, 1/6, or 1/12..    Chief Complaint:        Colin Hunter is a 50 y.o.??male??with PMHx of CML on asciminib, HFrEF??(EF 40% in 11/2020), RV dysfunction, Congenital Pulmonic Stenosis, Afib/Flutter, Uncontrolled T2DM, and CKD3 who presents today to establish care    Subjective:     HPI    Colin Hunter is a 50 y.o.??male??with PMHx of CML on asciminib, HFrEF??(EF 40% in 11/2020), RV dysfunction, Congenital Pulmonic Stenosis, Afib/Flutter, Uncontrolled T2DM, and CKD3 who presents today to establish care.  He was previously seen at [facility] for all of his medical needs however is unhappy with  the care there and has transitioned all of his medical care to [facility].  This is his first time being seen for primary care at [facility].  He also follows with cardiology & heme-onc and will be seen by nephrology & endocrine shortly.    Diabetes  He reports feeling much much better since he has started taking his diabetes medications every day. A1c today 9.9 whereas A1c 6 weeks ago was >14. Attributes change to taking daily medications. Sugars run low 100s to 200 now, not having any more in the 300s.  He keeps a log on his cell phone.  He is scheduled to be seen by endocrinology in November and is hoping that will be his last appointment with them if he can get his sugars back under control.  He says that in the past his A1c's have been better controlled between a 6-7 range and his goal is to get back to that. He was taking all of his lyrica at once, I advised him to take 3x daily as prescribed.     Volume overload  He has a complicated cardiac history due to congenital pulmonic valve stenosis causing volume overload.  In 2020 he was frequently hospitalized, about 6 times over the course of the year for continued heart failure exacerbations.  He was recently hospitalized (mid August) in the [facility] service for acute on chronic HFrEF.  He was reportedly about 40 pounds up from his dry weight.  During that admission he was adequately diuresed and also underwent ablation for a flutter. He has been able to follow-up with his cardiologist outpatient for definitive management of the pulmonic insufficiency/HfrEF. He does note some mild pedal edema today however is very minor compared to past episodes and he has no other symptoms He denies DOE and paroxysmal nocturnal dyspnea.     CKD  He is scheduled to see nephrology next month due to CKD.  He urinates well especially with the diuretics.    Left MSK back pain  He describes a back sprain that began 5 days ago.  He cannot think of a specific time that he pulled it, however was moving a heavy fish tank around that time.  He knows to avoid NSAIDs and has been using Tylenol.  He is open to physical therapy if it does not resolve within the next few weeks.  He just bought a heating pad.    Social history  He lives at home with his wife.  He has a 3-pack-year history of smoking but has not smoked recently.  He denies drug and alcohol use.  He lives out by [place] but is happy to make the drive to [facility] for medical care.    The past medical history, surgical history, family history, social history, medications and allergies were reviewed in Epic.     Review of Systems    The balance of 10 systems was reviewed and unremarkable except as stated above.      Objective:     BP 119/76  - Pulse 80  - Temp 36.5 ??C (97.7 ??F) (Oral)  - Resp 18  - Ht 188 cm (6' 2)  - Wt (!) 117.1 kg (258 lb 3.2 oz)  - SpO2 96%  - BMI 33.15 kg/m??      Physical Examination: Well- appearing, NAD  Neuro: A&Ox4, normal speech, no focal motor findings  HEENT: normocephalic and atraumatic without tenderness, nasal septum is midline, patent nares b/l  Eyes - extraocular eye movements intact, sclera  non-icteric   Mouth - mucous membranes moist  Neck - Supple, normal range of motion  Chest - Normal respiratory effort, clear to auscultation bilaterally, no crackles   Heart - normal rate and regular rhythm, splitting systolic murmur at R sternal border over pulmonic valve  Abdomen - soft, nontender, nondistended, no peritoneal signs  Extremities - minimal pedal edema, 1+ pitting  Skin - normal coloration and turgor, no rash    Records review including notes, labs etc.    PHQ-9 Score:     GAD-7 Score:         Medication adherence and barriers to the treatment plan have been addressed. Opportunities to optimize healthy behaviors have been discussed. Patient / caregiver voiced understanding.

## 2020-12-31 NOTE — Unmapped (Signed)
[facility] at [facility]       Type of visit:  face to face    Reason for visit: new patient     Questions / Concerns that need to be addressed: est care    General Consent to Treat (GCT) for non-epic video visits only: Verbal consent      Screening BP- 119/76        Allergies reviewed: Yes    Medication reviewed: Yes  Pended refills? No        HCDM reviewed and updated in Epic:    We are working to make sure all of our patients??? wishes are updated in Epic and part of that is documenting a Environmental health practitioner for each patient  A Health Care Decision Maker is someone you choose who can make health care decisions for you if you are not able - who would you most want to do this for you????  is already up to date.        BPAs completed:  PHQ2  AUDIT - Alcohol Screen  HARK - Interpersonal Violence      COVID Vaccination:  If Care Gap for COVID vaccine is present:  Have you received a COVID vaccine? yes  If yes: How many doses have you received? 0/1/2: 2   Type of Vaccine received?    Date of 1st dose:    Date of 2nd dose:    If no: Are you interested in scheduling?         __________________________________________________________________________________________    SCREENINGS COMPLETED IN FLOWSHEETS    HARK Screening  HARK Screening  Within the last year, have you been humiliated or emotionally abused in other ways by your partner or ex-partner?: No  Within the last year, have you been afraid of your partner or ex-partner?: No  Within the last year, have you been raped or forced to have any kind of sexual activity by your partner or ex-partner?: No  Within the last year, have you been kicked, hit, slapped, or otherwise physically hurt by your partner or ex-partner?: No    AUDIT  AUDIT - C Score (Part 1): 0    PHQ2  PHQ-2 Total Score : 0

## 2021-01-04 ENCOUNTER — Other Ambulatory Visit: Admit: 2021-01-04 | Discharge: 2021-01-04 | Payer: MEDICARE

## 2021-01-04 ENCOUNTER — Ambulatory Visit: Admit: 2021-01-04 | Discharge: 2021-01-04 | Payer: MEDICARE

## 2021-01-04 ENCOUNTER — Ambulatory Visit: Admit: 2021-01-04 | Discharge: 2021-01-04 | Payer: MEDICARE | Attending: Internal Medicine | Primary: Internal Medicine

## 2021-01-04 ENCOUNTER — Encounter: Admit: 2021-01-04 | Discharge: 2021-01-04 | Payer: MEDICARE | Attending: Internal Medicine | Primary: Internal Medicine

## 2021-01-04 DIAGNOSIS — I5020 Unspecified systolic (congestive) heart failure: Principal | ICD-10-CM

## 2021-01-04 DIAGNOSIS — Q221 Congenital pulmonary valve stenosis: Principal | ICD-10-CM

## 2021-01-04 DIAGNOSIS — C9210 Chronic myeloid leukemia, BCR/ABL-positive, not having achieved remission: Principal | ICD-10-CM

## 2021-01-04 LAB — CBC W/ AUTO DIFF
BASOPHILS ABSOLUTE COUNT: 0 10*9/L (ref 0.0–0.1)
BASOPHILS RELATIVE PERCENT: 0.8 %
EOSINOPHILS ABSOLUTE COUNT: 0.2 10*9/L (ref 0.0–0.5)
EOSINOPHILS RELATIVE PERCENT: 4.3 %
HEMATOCRIT: 41.5 % (ref 39.0–48.0)
HEMOGLOBIN: 13.9 g/dL (ref 12.9–16.5)
LYMPHOCYTES ABSOLUTE COUNT: 1.4 10*9/L (ref 1.1–3.6)
LYMPHOCYTES RELATIVE PERCENT: 23.7 %
MEAN CORPUSCULAR HEMOGLOBIN CONC: 33.4 g/dL (ref 32.0–36.0)
MEAN CORPUSCULAR HEMOGLOBIN: 27.4 pg (ref 25.9–32.4)
MEAN CORPUSCULAR VOLUME: 82.1 fL (ref 77.6–95.7)
MEAN PLATELET VOLUME: 10.4 fL (ref 6.8–10.7)
MONOCYTES ABSOLUTE COUNT: 0.5 10*9/L (ref 0.3–0.8)
MONOCYTES RELATIVE PERCENT: 8.6 %
NEUTROPHILS ABSOLUTE COUNT: 3.6 10*9/L (ref 1.8–7.8)
NEUTROPHILS RELATIVE PERCENT: 62.6 %
PLATELET COUNT: 104 10*9/L — ABNORMAL LOW (ref 150–450)
RED BLOOD CELL COUNT: 5.06 10*12/L (ref 4.26–5.60)
RED CELL DISTRIBUTION WIDTH: 17.3 % — ABNORMAL HIGH (ref 12.2–15.2)
WBC ADJUSTED: 5.7 10*9/L (ref 3.6–11.2)

## 2021-01-04 LAB — COMPREHENSIVE METABOLIC PANEL
ALBUMIN: 3.8 g/dL (ref 3.4–5.0)
ALKALINE PHOSPHATASE: 309 U/L — ABNORMAL HIGH (ref 46–116)
ALT (SGPT): 37 U/L (ref 10–49)
ANION GAP: 9 mmol/L (ref 5–14)
AST (SGOT): 37 U/L — ABNORMAL HIGH (ref <=34)
BILIRUBIN TOTAL: 0.7 mg/dL (ref 0.3–1.2)
BLOOD UREA NITROGEN: 50 mg/dL — ABNORMAL HIGH (ref 9–23)
BUN / CREAT RATIO: 22
CALCIUM: 9.8 mg/dL (ref 8.7–10.4)
CHLORIDE: 107 mmol/L (ref 98–107)
CO2: 23 mmol/L (ref 20.0–31.0)
CREATININE: 2.23 mg/dL — ABNORMAL HIGH
EGFR CKD-EPI (2021) MALE: 35 mL/min/{1.73_m2} — ABNORMAL LOW (ref >=60)
GLUCOSE RANDOM: 249 mg/dL — ABNORMAL HIGH (ref 70–179)
POTASSIUM: 4.6 mmol/L (ref 3.4–4.8)
PROTEIN TOTAL: 7.7 g/dL (ref 5.7–8.2)
SODIUM: 139 mmol/L (ref 135–145)

## 2021-01-04 LAB — LACTATE DEHYDROGENASE: LACTATE DEHYDROGENASE: 291 U/L — ABNORMAL HIGH (ref 120–246)

## 2021-01-04 LAB — URIC ACID: URIC ACID: 5.7 mg/dL

## 2021-01-04 NOTE — Unmapped (Signed)
[facility] Leukemia Clinic Follow-up    Patient Name: Colin Hunter  Patient Age: 50 y.o.  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  Given the cardiac and metabolic toxicities of nilotinib, ponatinib and dasatinib, we tried bosutinib as a 2nd line agent, which did not result in molecular response, though drug adherence may have been an issue early in his course.      He has now been on asciminib 40 mg BID for almost a month and has had normalization of neutrophilia, monocytosis and eosinophilia (all modest at baseline) consistent with complete hematologic response.  He has declining platelet count which may be treatment related, and given his anticoagulation will need to be monitored.    Overall he has tolerated asciminib well and has some early objective evidence of response.  We will plan to have the blood counts reviewed with an eye toward bleeding risks in ~3 weeks and if platelets are no longer dropping, see him back for 3 month BCR-ABL PCR in December (after on asciminib x 3 months).        Plan and Recommendations:  Chronic Myeloid Leukemia:   --Continue asciminib 40 mg PO BID  - BCR-ABL in December 2022  - CPP follow up in 3 weeks to assess initial drug adherence and tolerance, as well as thrombocytopenia, bleeding risk with patient on apixiban.      -- HFrEF (EF 30-35%): follow up with Dr. Derinda Sis at [facility]  Cardiology.  If he fails asciminib, consultation with cardio-oncology would be reasonable before considering alternate agents that have known cardiac toxicity.    T2DM:   - followed by PCP  - multiple ED visit with uncontrolled levels    Hx Syncope/Pre-syncope: perhaps improved risk after ablation for atrial fibrillation.    -- Hypercalcemia: Low PTH previously.    -- CKD Stage 3: Baseline creatinine ~2. Likely secondary to diabetic and hypertensive nephropathy.      -- Right atrial thrombus: Emerged despite anticoagulation with Eliquis. Since transitioned to Warfarin (as of 05/2019) though has had issues with supratherapeutic INR levels. We will need to monitor for even modest thrombocytopenia as he starts asciminib    Infection management: no active infection    Symptom management: Antiemetics as listed in medication list, analgesics as listed in medication list or the following changes were made: instructed on use of loperamide for diarrhea induced by bosutinib    Venous access: peripheral venous access only     Supportive Care Recommendations:  We recommend based on the patient???s underlying diagnosis and treatment history the following supportive care:      1. Antimicrobial prophylaxis:  Other: Administered seasonal influenza vaccine for 2022-23 on 01/04/21.  Recommended bivalent Omicron COVID 19 booster.       2. Blood product support:  I recommend the following intervals for laboratory monitoring to determine transfusion needs and monitor for hematologic effects of therapy and underlying disease: every 2 weeks to every 3 weeks for now.  Leukoreduced blood products are required.  Irradiated blood products are preferred, but in case of urgent transfusion needs non-irradiated blood products may be used:     -  RBC transfusion threshold: transfuse 2 units for Hgb < 8 g/dL.  -  Platelet transfusion threshold: transfuse 1 unit of platelets for platelet count < 10, or for bleeding or need for invasive procedure.    3. Hematopoietic growth factor support: none      Care coordination: The next [facility] Leukemia Clinic Follow up will be requested for 2 weeks with CPP, 1 month with MD/APP    These services include the following elements of medical decision making:  addressing a hematologic cancer that poses a threat to life and/or bone marrow function  review of external medical documentation, interpretation of diagnostic test reports or ordering of diagnostic tests and discussion of management with provider(s) external to the leukemia clinic  High risk of morbidity due to drug therapy requiring monitoring for toxicity or decisions regarding goals and continuation of antineoplastic therapy      Molli Hazard C. Malen Gauze, MD  Leukemia Program  Division of Hematology/Oncology  [facility]    Nurse Navigator (non-clinical trial patients): Colletta Maryland, RN        Tel. [phone]       Fax. [phone]  Toll-free appointments: [phone]  Scheduling assistance: [phone]  After hours/weekends: [phone] (ask for adult hematology/oncology on-call)        History of Present Illness:  We had the pleasure of seeing Colin Hunter in the Leukemia Clinic at the [place] [facility] [place] on 01/04/2021.  He is a 50 y.o. male with Chronic Myeloid Leukemia.        His oncologic history is as follows:      Oncology History Overview Note   CML, chronic phase:   Leukocytosis to ~30 at time of diagnosis with normal platelet count and Hgb.     Bone marrow biopsy 06/26/2015:   CML, chronic phase (2% blasts, 2% promyelocytes)    Aspirate Smear:   The aspirate smear is hypercellular with rare small spicules.   Myeloid elements are increased with full maturation, without significant   cytologic atypia. Erythroid elements are markedly decreased, without significant cytologic atypia. The myeloid to erythroid ratio is >10:1. Many small hypolobated megakaryocytes are present. Storage iron is present; ringed sideroblasts are not identified.     A differential count of 200 nucleated cells shows the following percentages:   2 Blasts   2 Promyelocytes   31 Myelocytes/Metamyelocytes   51 Bands/Neutrophils   3 Monocytes   3 Eosinophils   1 Basophils   1 Lymphocytes   0 Plasma cells   6 NRBCs     Biopsy:   H&E and PAS stained sections show unremarkable trabecular bone and markedly hypercellular marrow for age (95% cellularity). The myeloid to erythroid ratio is >5:1. Erythroid elements are decreased with full maturation. Myeloid elements are markedly increased with full maturation.   Megakaryocytes are increased with many small hypolobated forms.   Scattered small lymphocytes and plasma cells account for <5% of   cellularity. A reticulin stain shows grade 1 of 3 fibrosis (European Consensus System).     Flow cytometry:   Flow cytometry performed on the bone marrow aspirate ([id number])   demonstrates a non-discrete population of blasts and maturing   myelomonocytic cells within the blast gate (<2% of total events).     CBC:   A tandem CBC shows: WBC 27.9 (55.7% neutrophils,  0.9% bands, 0.9%   metamyelocytes, 11.3% myelocytes, 10.4% lymphocytes, 13.2% monocytes, 1.0%   eosinophils, 4.7% basophils), Hgb 13.4, MCV 87, Plt 224.     Treatment:   Imatinib, 400mg , 06/26/2015-05/17/2019  Bosutiib, 400 mg 10/04/2019 -     BCR/ABL testing:   06/2015: 87.086%  07/17/2015: 140.922%  08/18/2015: 75.693%  12/08/2015: 2.144%  01/07/2016: 2.450%  03/29/2016: 2.008%  07/26/2016: 2.871%; Kinase domain mutation testing negative  10/28/2016: 1.247%  01/05/2017: 0.641%  03/14/2017: 0.348%; Kinase domain mutation testing negative  06/25/19: Result not available   07/26/19: Result not available      07/26/19 FISH: positive t(9;22) in ~90.5% of cells     Final Diagnosis   Date Value Ref Range Status   08/20/2019   Final    Bone marrow, right iliac, aspiration and biopsy  -  Hypercellular bone marrow (95%) involved by chronic myeloid leukemia, BCR-ABL1-positive, chronic phase (2% blasts by manual aspirate differential)  -  See linked reports for associated Ancillary Studies.      This electronic signature is attestation that the pathologist personally reviewed the submitted material(s) and the final diagnosis reflects that evaluation.         08/12/19 BCR/ABL: 28.427 %  08/20/19 BCR/ABL: 35.897%    08/12/19 TKI Resistance Testing: Negative    10/04/19: started bosutinib 400 mg daily    Current treatment:   Bosutinib 400 mg daily     Chronic myeloid leukemia (CMS-HCC)   06/26/2015 Initial Diagnosis    Chronic myeloid leukemia (CMS-HCC)     06/26/2015 - 05/17/2019 Chemotherapy    Imatinib, 400mg  daily; discontinued due to worsening edema     10/04/2019 - 11/2020 Chemotherapy    Bosutinib 400mg      11/11/2020 Progression    Loss of hematologic response with neutrophilia, monocytosis, eosinophilia but not basophilia. Possibly due to non-adherence and association of LE edema with bosutinib as potential toxicity--lack of molecular response but no BCR-ABL point mutations     12/10/2020 -  Chemotherapy    Asciminib 40 mg PO BID     01/04/2021 Remission    Complete hematologic response         Interim History:  Since last seen here, he says he feels the asciminib is better tolerated than other agents.  He has no LE edema, GI symptoms, arthralgias or rash.  He admits to forgetting the evening dose twice since 9/1, and admits that the take on empty stomach instructions are challenging to follow.  He has had no further syncope, palpitations of presyncope.     Past Medical, Surgical and Family History were reviewed and pertinent updates were made in the Electronic Medical Record      ECOG Performance Status: 1    Medications:    Current Outpatient Medications   Medication Sig Dispense Refill   ??? apixaban (ELIQUIS) 5 mg Tab Take 5 mg by mouth Two (2) times a day.     ??? asciminib (SCEMBLIX) 40 mg tablet Take 1 tablet (40 mg total) by mouth Two (2) times a day. Take on an empty stomach, at least 1 hour before or 2 hours after a meal. Swallow tablets whole. Do not break, crush, or chew the tablets. 60 tablet 11   ??? folic acid (FOLVITE) 1 MG tablet Take 1 tablet (1 mg total) by mouth in the morning. 90 tablet 3   ??? HUMULIN R REGULAR U-100 INSULN 100 unit/mL injection INJECT 15 UNITS SUBCUTANEOUSLY THREE TIMES DAILY BEFORE MEAL(S)     ???  insulin glargine (BASAGLAR, LANTUS) 100 unit/mL (3 mL) injection pen Inject 0.27 mL (27 Units total) under the skin nightly. 15 mL 12   ??? metoprolol succinate (TOPROL-XL) 100 MG 24 hr tablet Take 1 tablet (100 mg total) by mouth daily. 30 tablet 1   ??? OZEMPIC 0.25 mg or 0.5 mg(2 mg/1.5 mL) PnIj injection Inject 0.5 mg under the skin every seven (7) days.     ??? pantoprazole (PROTONIX) 40 MG tablet Take 40 mg by mouth daily.     ??? pregabalin (LYRICA) 100 MG capsule Take 100 mg by mouth Three (3) times a day.     ??? rosuvastatin (CRESTOR) 20 MG tablet Take 1 tablet (20 mg total) by mouth daily. 90 tablet 0   ??? simethicone (MYLICON) 80 MG chewable tablet Chew 1 tablet (80 mg total) every six (6) hours as needed for flatulence. 60 tablet 2   ??? torsemide (DEMADEX) 20 MG tablet Take by mouth two (2) times a day. 40 mg in the morning and 20 mg in the evening     ??? BINAXNOW COVID-19 AG SELF TEST Kit      ??? blood sugar diagnostic Strp      ??? blood-glucose meter Misc      ??? insulin syringe-needle U-100 0.5 mL 29 gauge x 1/2 (12 mm) Syrg Use to inject 4 times daily as directed.     ??? lancets Misc by Miscellaneous route.     ??? ONETOUCH VERIO TEST STRIPS Strp USE 1 STRIP TO CHECK GLUCOSE TWICE DAILY     ??? pen needle, diabetic 29 gauge x 1/2 (12 mm) Ndle Please substitute for cheapest generic available. Diagnosis E11.9.Z79.4 110 each 0     Current Facility-Administered Medications   Medication Dose Route Frequency Provider Last Rate Last Admin   ??? influenza vaccine quad (FLUARIX, FLULAVAL, FLUZONE) (6 MOS & UP) 2022-23 ADS Med                Vital Signs:  Vitals:    01/04/21 1150   BP: 156/92   Pulse: 81   Resp: 20   Temp: 36.3 ??C (97.4 ??F)   SpO2: 99%         Relevant Physical Exam Findings:  none        Relevant Laboratory, radiology and pathology results:  I personally viewed the most recent internal records, external records, laboratory results and molecular pathology reports  and discussed the available results with the patient and the pharmacy team  A summary of results follows:  Lab on 01/04/2021   Component Date Value Ref Range Status   ??? Sodium 01/04/2021 139  135 - 145 mmol/L Final   ??? Potassium 01/04/2021 4.6  3.4 - 4.8 mmol/L Final   ??? Chloride 01/04/2021 107  98 - 107 mmol/L Final   ??? CO2 01/04/2021 23.0  20.0 - 31.0 mmol/L Final   ??? Anion Gap 01/04/2021 9  5 - 14 mmol/L Final   ??? BUN 01/04/2021 50 (A) 9 - 23 mg/dL Final   ??? Creatinine 01/04/2021 2.23 (A) 0.60 - 1.10 mg/dL Final   ??? BUN/Creatinine Ratio 01/04/2021 22   Final   ??? eGFR CKD-EPI (2021) Male 01/04/2021 35 (A) >=60 mL/min/1.80m2 Final    eGFR calculated with CKD-EPI 2021 equation in accordance with [employer] and [employer] of Nephrology Task Force recommendations.   ??? Glucose 01/04/2021 249 (A) 70 - 179 mg/dL Final   ??? Calcium 16/01/9603 9.8  8.7 - 10.4 mg/dL  Final   ??? Albumin 01/04/2021 3.8  3.4 - 5.0 g/dL Final   ??? Total Protein 01/04/2021 7.7  5.7 - 8.2 g/dL Final   ??? Total Bilirubin 01/04/2021 0.7  0.3 - 1.2 mg/dL Final   ??? AST 16/01/9603 37 (A) <=34 U/L Final   ??? ALT 01/04/2021 37  10 - 49 U/L Final   ??? Alkaline Phosphatase 01/04/2021 309 (A) 46 - 116 U/L Final   ??? LDH 01/04/2021 291 (A) 120 - 246 U/L Final   ??? Uric Acid 01/04/2021 5.7  3.7 - 9.2 mg/dL Final   ??? Check Type 01/04/2021 Need Type Check   Final    2nd sample is required for [facility] confirmation.   ??? ABO Grouping 01/04/2021 O POS   Final   ??? Antibody Screen 01/04/2021 NEG   Final   ??? WBC 01/04/2021 5.7  3.6 - 11.2 10*9/L Final   ??? RBC 01/04/2021 5.06  4.26 - 5.60 10*12/L Final   ??? HGB 01/04/2021 13.9  12.9 - 16.5 g/dL Final   ??? HCT 54/12/8117 41.5  39.0 - 48.0 % Final   ??? MCV 01/04/2021 82.1  77.6 - 95.7 fL Final   ??? MCH 01/04/2021 27.4  25.9 - 32.4 pg Final   ??? MCHC 01/04/2021 33.4  32.0 - 36.0 g/dL Final   ??? RDW 14/78/2956 17.3 (A) 12.2 - 15.2 % Final   ??? MPV 01/04/2021 10.4  6.8 - 10.7 fL Final   ??? Platelet 01/04/2021 104 (A) 150 - 450 10*9/L Final   ??? Neutrophils % 01/04/2021 62.6  % Final   ??? Lymphocytes % 01/04/2021 23.7  % Final   ??? Monocytes % 01/04/2021 8.6  % Final   ??? Eosinophils % 01/04/2021 4.3  % Final   ??? Basophils % 01/04/2021 0.8  % Final   ??? Absolute Neutrophils 01/04/2021 3.6  1.8 - 7.8 10*9/L Final   ??? Absolute Lymphocytes 01/04/2021 1.4  1.1 - 3.6 10*9/L Final   ??? Absolute Monocytes 01/04/2021 0.5  0.3 - 0.8 10*9/L Final   ??? Absolute Eosinophils 01/04/2021 0.2  0.0 - 0.5 10*9/L Final   ??? Absolute Basophils 01/04/2021 0.0  0.0 - 0.1 10*9/L Final   ??? Anisocytosis 01/04/2021 Slight (A) Not Present Final   Office Visit on 12/31/2020   Component Date Value Ref Range Status   ??? HGB A1C, POC 12/31/2020 9.9 (A) <7.0 % Final    A1c Glycemic Goal: <7.0%     **Goals should be individualized; more or less stringent A1c glycemic goals may be appropriate for individual patients.      (Adopted from: 2020 ADA Standards of Medical Care In Diabetes)   Point of Care A1c testing is not FDA-approved for the diagnosis of Diabetes.    ??? 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   -   A BCR/ABL1 interphase FISH assay shows an abnormal signal pattern, consistent with the 9;22 translocation documented by G-banding, in 95% of the 100 cells scored  -   BCR-ABL p210 transcripts were detected at a level of 35.897 IS %  ratio in bone marrow.  - See linked reports for associated Ancillary Studies.        This electronic signature is attestation that the pathologist personally reviewed the submitted material(s) and the final diagnosis reflects that evaluation.       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   -   A BCR/ABL1 interphase FISH assay shows an abnormal signal pattern, consistent with the 9;22 translocation documented by G-banding, in 95% of the 100 cells scored  -   BCR-ABL p210 transcripts were detected at a level of 35.897 IS % ratio in bone marrow.  -  See linked reports for associated Ancillary Studies.        This electronic signature is attestation that the pathologist personally reviewed the submitted material(s) and the final diagnosis reflects that evaluation.

## 2021-01-04 NOTE — Unmapped (Signed)
Your blood counts show that you are responding to the asciminib for the CML.    Since you have not had gout, you may stop the allopurinol.    Since you are on apixiban, a blood thinner, I would like to make sure your platelets do not go too low.  Please have your blood counts checked in [place] in 2-3 weeks and then we will schedule a pharmacy provider call to review the bleeding risk.    We will next check your leukemia gene level in early December.    Lab on 01/04/2021   Component Date Value Ref Range Status    Sodium 01/04/2021 139  135 - 145 mmol/L Final    Potassium 01/04/2021 4.6  3.4 - 4.8 mmol/L Final    Chloride 01/04/2021 107  98 - 107 mmol/L Final    CO2 01/04/2021 23.0  20.0 - 31.0 mmol/L Final    Anion Gap 01/04/2021 9  5 - 14 mmol/L Final    BUN 01/04/2021 50 (A) 9 - 23 mg/dL Final    Creatinine 16/01/9603 2.23 (A) 0.60 - 1.10 mg/dL Final    BUN/Creatinine Ratio 01/04/2021 22   Final    eGFR CKD-EPI (2021) Male 01/04/2021 35 (A) >=60 mL/min/1.81m2 Final    eGFR calculated with CKD-EPI 2021 equation in accordance with [employer] and [employer] of Nephrology Task Force recommendations.    Glucose 01/04/2021 249 (A) 70 - 179 mg/dL Final    Calcium 54/12/8117 9.8  8.7 - 10.4 mg/dL Final    Albumin 14/78/2956 3.8  3.4 - 5.0 g/dL Final    Total Protein 01/04/2021 7.7  5.7 - 8.2 g/dL Final    Total Bilirubin 01/04/2021 0.7  0.3 - 1.2 mg/dL Final    AST 21/30/8657 37 (A) <=34 U/L Final    ALT 01/04/2021 37  10 - 49 U/L Final    Alkaline Phosphatase 01/04/2021 309 (A) 46 - 116 U/L Final    LDH 01/04/2021 291 (A) 120 - 246 U/L Final    Uric Acid 01/04/2021 5.7  3.7 - 9.2 mg/dL Final    Check Type 84/69/6295 Need Type Check   Final    2nd sample is required for [facility] confirmation.    WBC 01/04/2021 5.7  3.6 - 11.2 10*9/L Final    RBC 01/04/2021 5.06  4.26 - 5.60 10*12/L Final    HGB 01/04/2021 13.9  12.9 - 16.5 g/dL Final    HCT 28/41/3244 41.5  39.0 - 48.0 % Final    MCV 01/04/2021 82.1  77.6 - 95.7 fL Final    MCH 01/04/2021 27.4  25.9 - 32.4 pg Final    MCHC 01/04/2021 33.4  32.0 - 36.0 g/dL Final    RDW 04/13/7251 17.3 (A) 12.2 - 15.2 % Final    MPV 01/04/2021 10.4  6.8 - 10.7 fL Final    Platelet 01/04/2021 104 (A) 150 - 450 10*9/L Final    Neutrophils % 01/04/2021 62.6  % Final    Lymphocytes % 01/04/2021 23.7  % Final    Monocytes % 01/04/2021 8.6  % Final    Eosinophils % 01/04/2021 4.3  % Final    Basophils % 01/04/2021 0.8  % Final    Absolute Neutrophils 01/04/2021 3.6  1.8 - 7.8 10*9/L Final    Absolute Lymphocytes 01/04/2021 1.4  1.1 - 3.6 10*9/L Final    Absolute Monocytes 01/04/2021 0.5  0.3 - 0.8 10*9/L Final    Absolute Eosinophils 01/04/2021 0.2  0.0 -  0.5 10*9/L Final    Absolute Basophils 01/04/2021 0.0  0.0 - 0.1 10*9/L Final    Anisocytosis 01/04/2021 Slight (A) Not Present Final

## 2021-01-04 NOTE — Unmapped (Signed)
-----   Message from Trudee Grip, RN sent at 12/18/2020 10:48 AM EDT -----  Regarding: bmp  Patient needs BMP drawn at 09/26 oncology appnt for 09/28 CTA cardiac- call to remind him, double check order. Ect.

## 2021-01-05 NOTE — Unmapped (Signed)
[facility] Clinical Assessment & Refill Coordination Note    Colin Hunter, DOB: [birth year]  Phone: [phone] (home)     All above HIPAA information was verified with patient.     Was a Nurse, learning disability used for this call? No    Specialty Medication(s):   Hematology/Oncology: Scemblix     Current Outpatient Medications   Medication Sig Dispense Refill   ??? apixaban (ELIQUIS) 5 mg Tab Take 5 mg by mouth Two (2) times a day.     ??? asciminib (SCEMBLIX) 40 mg tablet Take 1 tablet (40 mg total) by mouth Two (2) times a day. Take on an empty stomach, at least 1 hour before or 2 hours after a meal. Swallow tablets whole. Do not break, crush, or chew the tablets. 60 tablet 11   ??? BINAXNOW COVID-19 AG SELF TEST Kit      ??? blood sugar diagnostic Strp      ??? blood-glucose meter Misc      ??? folic acid (FOLVITE) 1 MG tablet Take 1 tablet (1 mg total) by mouth in the morning. 90 tablet 3   ??? HUMULIN R REGULAR U-100 INSULN 100 unit/mL injection INJECT 15 UNITS SUBCUTANEOUSLY THREE TIMES DAILY BEFORE MEAL(S)     ??? insulin glargine (BASAGLAR, LANTUS) 100 unit/mL (3 mL) injection pen Inject 0.27 mL (27 Units total) under the skin nightly. 15 mL 12   ??? insulin syringe-needle U-100 0.5 mL 29 gauge x 1/2 (12 mm) Syrg Use to inject 4 times daily as directed.     ??? lancets Misc by Miscellaneous route.     ??? metoprolol succinate (TOPROL-XL) 100 MG 24 hr tablet Take 1 tablet (100 mg total) by mouth daily. 30 tablet 1   ??? ONETOUCH VERIO TEST STRIPS Strp USE 1 STRIP TO CHECK GLUCOSE TWICE DAILY     ??? OZEMPIC 0.25 mg or 0.5 mg(2 mg/1.5 mL) PnIj injection Inject 0.5 mg under the skin every seven (7) days.     ??? pantoprazole (PROTONIX) 40 MG tablet Take 40 mg by mouth daily.     ??? pen needle, diabetic 29 gauge x 1/2 (12 mm) Ndle Please substitute for cheapest generic available. Diagnosis E11.9.Z79.4 110 each 0   ??? pregabalin (LYRICA) 100 MG capsule Take 100 mg by mouth Three (3) times a day.     ??? rosuvastatin (CRESTOR) 20 MG tablet Take 1 tablet (20 mg total) by mouth daily. 90 tablet 0   ??? simethicone (MYLICON) 80 MG chewable tablet Chew 1 tablet (80 mg total) every six (6) hours as needed for flatulence. 60 tablet 2   ??? torsemide (DEMADEX) 20 MG tablet Take by mouth two (2) times a day. 40 mg in the morning and 20 mg in the evening       No current facility-administered medications for this visit.        Changes to medications: Colin Hunter Reports stopping the following medications: allopurinol    No Known Allergies    Changes to allergies: No    SPECIALTY MEDICATION ADHERENCE     Scemblix (asciminib) 40 mg: 7 days of medicine on hand Colin Hunter unsure of exact count - guessed about 7 days on hand)    Medication Adherence    Patient reported X missed doses in the last month: 1-2  Specialty Medication: asciminib 40 mg tablets - 1 tablet (40 mg) twice daily  Patient is on additional specialty medications: No  Informant: patient  Confirmed plan for next specialty  medication refill: delivery by pharmacy  Refills needed for supportive medications: not needed          Specialty medication(s) dose(s) confirmed: Regimen is correct and unchanged.     Are there any concerns with adherence? sometimes misses evening dose    Adherence counseling provided? Yes: Counseled to take dose prior to going to bed if within an hour.  Better to take doses 11 hours apart than to miss or skip dose    CLINICAL MANAGEMENT AND INTERVENTION      Clinical Benefit Assessment:    Do you feel the medicine is effective or helping your condition? Yes    Clinical Benefit counseling provided? Not needed    Adverse Effects Assessment:    Are you experiencing any side effects? No    Are you experiencing difficulty administering your medicine? No    Quality of Life Assessment:    Quality of Life    Rheumatology  Oncology  Dermatology  Cystic Fibrosis          How many days over the past month did your CML  keep you from your normal activities? For example, brushing your teeth or getting up in the morning. 0    Have you discussed this with your provider? Not needed    Acute Infection Status:    Acute infections noted within Epic:  No active infections  Patient reported infection: None    Therapy Appropriateness:    Is therapy appropriate and patient progressing towards therapeutic goals? Yes, therapy is appropriate and should be continued    DISEASE/MEDICATION-SPECIFIC INFORMATION      N/A    PATIENT SPECIFIC NEEDS     - Does the patient have any physical, cognitive, or cultural barriers? No    - Is the patient high risk? Yes, patient is taking oral chemotherapy. Appropriateness of therapy as been assessed    - Does the patient require a Care Management Plan? No     - Does the patient require physician intervention or other additional services (i.e. nutrition, smoking cessation, social work)? No      SHIPPING     Specialty Medication(s) to be Shipped:   Hematology/Oncology: Scemblix (asciminib) 40 mg    Other medication(s) to be shipped: No additional medications requested for fill at this time     Changes to insurance: No    Delivery Scheduled: Yes, Expected medication delivery date: 01/08/21.     Medication will be delivered via UPS to the confirmed prescription address in [facility].    The patient will receive a drug information handout for each medication shipped and additional FDA Medication Guides as required.  Verified that patient has previously received a Conservation officer, historic buildings and a Surveyor, mining.    The patient or caregiver noted above participated in the development of this care plan and knows that they can request review of or adjustments to the care plan at any time.      All of the patient's questions and concerns have been addressed.    Colin Hunter [facility] Specialty Pharmacist

## 2021-01-05 NOTE — Unmapped (Signed)
As directed by Dr. Malen Gauze, I have contacted the patient to review/clarify the following:    ?? In conjunction with the plan of care highlighted in Dr. Mercy Riding clinic note from 01/04/2021, the following has been coordinated:    ?? Repeat lab work is to be collected pertaining to a CBC with differential by the care team at [facility] under the direction of Everlean Patterson, NP ~01/25/2021.  ?? Specifically, Dr. Malen Gauze is concerned about [name]' platelet count trend.    ?? A follow-up telephone visit with the pharmacy CCP on the Leukemia Care Team at [facility] will be coordinated to review the collected lab work ~01/25/2021.    ?? Barring any unforeseen changes, the next clinic follow-up in same-day repeat lab draw is tentatively scheduled on 02/10/2021 with Colin Gauss, NP.    Otherwise, the patient expressed verbal understanding and had no other questions or concerns at this time.  He is in agreement with the plan outlined above and will contact clinic back sooner should he have any other issues.    No other actions taken.

## 2021-01-06 ENCOUNTER — Ambulatory Visit: Admit: 2021-01-06 | Discharge: 2021-01-07 | Payer: MEDICARE

## 2021-01-06 MED ADMIN — iohexoL (OMNIPAQUE) 350 mg iodine/mL solution 40 mL: 40 mL | INTRAVENOUS | @ 18:00:00 | Stop: 2021-01-06

## 2021-01-07 MED FILL — SCEMBLIX 40 MG TABLET: ORAL | 30 days supply | Qty: 60 | Fill #1

## 2021-01-18 MED ORDER — APIXABAN 5 MG TABLET
Freq: Two times a day (BID) | ORAL | 0 refills | 0.00000 days
Start: 2021-01-18 — End: ?

## 2021-01-20 MED ORDER — APIXABAN 5 MG TABLET
ORAL_TABLET | Freq: Two times a day (BID) | ORAL | 12 refills | 30 days | Status: CP
Start: 2021-01-20 — End: ?

## 2021-01-21 ENCOUNTER — Ambulatory Visit: Admit: 2021-01-21 | Payer: MEDICARE | Attending: Nephrology | Primary: Nephrology

## 2021-01-22 ENCOUNTER — Ambulatory Visit: Admit: 2021-01-22 | Discharge: 2021-01-23 | Payer: MEDICARE

## 2021-01-22 NOTE — Unmapped (Addendum)
Continue medication and lifestyle management     Decrease Lasix to 20 mg 2x/ day. Can increase fluid consumption. Eliminate salt and fast food consumption.

## 2021-01-22 NOTE — Unmapped (Signed)
DIVISION OF CARDIOLOGY  [employer] [place]        Date of Service: 01/22/2021      PCP: Referring Provider:   Salem Caster, MD  [address]  [place] [address]  Phone: [phone]  Fax: [phone] Irene Limbo, MD  [address] [place],  [place] [address]  Phone: N/A  Fax:      ______________________________________________________________________________________________      ASSESSMENT AND PLAN:       1. 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   - he was lost to follow up and presented with syncope and volume overload.  He has had aflutter ablation and is doing well.    - he is doing much better from a volume standpoint.  We will decrease his diuretics to 20g BID.  - will review his cardiac CT.    2.  Aflutter  - s/p Aflutter ablation.  He is anticoagulated.      3.  CHF - non-ischemic.  Per patient, he had a cardiac cath at [facility] that did not show significant CAD.      4.  PFO - from previous echos.  No history of stroke.        [name], MD,  [facility], [facility]  Interventional Cardiology  Associate Professor of Medicine  [place] [facility]    ______________________________________________________________________________________________      SUBJECTIVE:     HISTORY OF PRESENT ILLNESS:    Dear Dr. Irene Limbo, MD  [address] [place],  [place] [address],      I had the pleasure of seeing Colin Hunter in our Adult Congenital Heart Disease (ACHD) Clinic today for follow up referred to [place] by Dr. Berline Lopes in regards to Colin Hunter' history of congenital pulmonary stenosis s/p surgical valvotomy as a child.      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  He also has DM, CKD and HFrEF with last LVEF of 45-50% (reported) and is in Aflutter at the time of the visit.      Overall, Colin Hunter state that he has been doing better lately.  He was diagnosed with CML back in 2018 and was started on Gleevec at the time.  He became volume overloaded and experienced quite a bit of orthopnea which led to him being admitted to [facility] about 6 times since September of 2020.  It seems that he has been on a better HF regimen/diuresis since March of 2021 and has not been hospitalized since then.  Overall, he appears well today.  He had an echocardiogram that was read as an EF of 45-50% with moderate PI.  His PI is severe, and has severely dilated RV and RV dysfunction.     We had set him up for cardioversion, but he was found to have a LAA clot.  He then got lost to follow up for about a year with [place] - and he was primarily going to [facility] for his care.  He presented to [place] last month with AFlutter with RVR and in decompensated HF.  We discussed managements -  options and he had an aflutter ablation while he was in the hospital.      We obtained a cardiac CT to screen his candidacy for Harmony valve.  His volume status is stable.     He reports no lightheadedness, dizziness, palpitations, or syncope.  Patient also denies any overt heart failure symptoms such as orthopnea, paroxysmal nocturnal dyspnea, or worsening lower extremity edema.      Colin Hunter states compliance with his medications and denies any untoward side effects from it.      He works for [employer].       CARDIOVASCULAR HISTORY AND PROCEDURES    Cardiovascular Studies Date Comments     ECG 2021 Aflutter with variable block at 102, RBBB   Echo 2021 EF of 45-50% / Severe RV dilatation and decreased function.  Moderate PI with PASP of 45 mm Hg.   Stress test 2021    Cardiac catheterization 2021    Electrophysiology Cardiovascular Surgery 1975 Surgical pulmonary valvotomy   Peripheral Vascular Studies         I have reviewed the cardiology tests personally.      PAST MEDICAL HISTORY  Past Medical History:   Diagnosis Date   ??? CKD (chronic kidney disease) stage 3, GFR 30-59 ml/min (CMS-HCC)    ??? CML (chronic myelocytic leukemia) (CMS-HCC)    ??? HFrEF (heart failure with reduced ejection fraction) (CMS-HCC)    ??? Paroxysmal atrial fibrillation (CMS-HCC)    ??? PFO (patent foramen ovale)    ??? Right atrial thrombus    ??? T2DM (type 2 diabetes mellitus) (CMS-HCC)        ALLERGIES  Patient has no known allergies.      CURRENT MEDICATIONS  Current Outpatient Medications   Medication Sig Dispense Refill   ??? apixaban (ELIQUIS) 5 mg Tab Take 1 tablet (5 mg total) by mouth Two (2) times a day. 60 tablet 12   ??? asciminib (SCEMBLIX) 40 mg tablet Take 1 tablet (40 mg total) by mouth Two (2) times a day. Take on an empty stomach, at least 1 hour before or 2 hours after a meal. Swallow tablets whole. Do not break, crush, or chew the tablets. 60 tablet 11   ??? BINAXNOW COVID-19 AG SELF TEST Kit      ??? blood sugar diagnostic Strp      ??? blood-glucose meter Misc      ??? folic acid (FOLVITE) 1 MG tablet Take 1 tablet (1 mg total) by mouth in the morning. 90 tablet 3   ??? HUMULIN R REGULAR U-100 INSULN 100 unit/mL injection INJECT 15 UNITS SUBCUTANEOUSLY THREE TIMES DAILY BEFORE MEAL(S)     ??? insulin glargine (BASAGLAR, LANTUS) 100 unit/mL (3 mL) injection pen Inject 0.27 mL (27 Units total) under the skin nightly. 15 mL 12   ??? insulin syringe-needle U-100 0.5 mL 29 gauge x 1/2 (12 mm) Syrg Use to inject 4 times daily as directed.     ??? lancets Misc by Miscellaneous route.     ??? metoprolol succinate (TOPROL-XL) 100 MG 24 hr tablet Take 1 tablet (100 mg total) by mouth daily. 30 tablet 1   ??? ONETOUCH VERIO TEST STRIPS Strp USE 1 STRIP TO CHECK GLUCOSE TWICE DAILY     ??? OZEMPIC 0.25 mg or 0.5 mg(2 mg/1.5 mL) PnIj injection Inject 0.5 mg under the skin every seven (7) days.     ??? pantoprazole (PROTONIX) 40 MG tablet Take 40 mg by mouth daily.     ???  pen needle, diabetic 29 gauge x 1/2 (12 mm) Ndle Please substitute for cheapest generic available. Diagnosis E11.9.Z79.4 110 each 0   ??? pregabalin (LYRICA) 100 MG capsule Take 100 mg by mouth Three (3) times a day.     ??? rosuvastatin (CRESTOR) 20 MG tablet Take 1 tablet (20 mg total) by mouth daily. 90 tablet 0   ??? simethicone (MYLICON) 80 MG chewable tablet Chew 1 tablet (80 mg total) every six (6) hours as needed for flatulence. 60 tablet 2   ??? torsemide (DEMADEX) 20 MG tablet Take by mouth two (2) times a day. 40 mg in the morning and 20 mg in the evening       No current facility-administered medications for this visit.       FAMILY HISTORY  Negative for early CAD    SOCIAL HISTORY  He  reports that he quit smoking about 2 years ago. His smoking use included cigarettes. He has a 3.00 pack-year smoking history. He has never used smokeless tobacco. He reports that he does not drink alcohol and does not use drugs.      REVIEW OF SYSTEMS    Review of Systems - 10 systems were reviewed and negative except as noted in HPI    Constitutional: negative for - chills, fatigue, fever or night sweats  ENT ROS: negative for - vertigo or visual changes  Hematological and Lymphatic ROS: negative for - blood transfusions, jaundice, night sweats or swollen lymph nodes  Endocrine ROS: negative for - skin changes or temperature intolerance  Respiratory ROS: no cough, shortness of breath, or wheezing negative for - hemoptysis, orthopnea, shortness of breath, tachypnea or wheezing  Cardiovascular ROS:  As reported in HPI  Gastrointestinal ROS: negative for - abdominal pain, hematemesis, melena, nausea/vomiting or swallowing difficulty/pain  Genito-Urinary ROS: negative for - dysuria, incontinence or nocturia  Musculoskeletal ROS: negative for - gait disturbance, joint pain, muscle pain or muscular weakness  Neurological ROS: negative for - behavioral changes, bowel and bladder control changes, headaches, seizures or speech problems    PHYSICAL EXAM     Physical Exam  There were no vitals taken for this visit.   Wt Readings from Last 3 Encounters:   01/04/21 (!) 117 kg (258 lb)   12/31/20 (!) 117.1 kg (258 lb 3.2 oz)   12/18/20 (!) 116.6 kg (257 lb)       General:  Alert, no distress.   Eyes:  Intact, sclerae anicteric.   Ears, nose, mouth: Moist mucous membranes.Supple, no carotid bruit.    Respiratory:   CTAB bilaterally with normal WOB.   Cardiovascular:  No carotid bruit, no JVD, RRR 2/6 systolic murmur at RUSB   Gastrointestinal:   Normal bowel sounds, soft, NTND.   Musculoskeletal: Normal strength   Skin: Warm, well perfused.   Neurologic: No focal deficits.       Most recent labs   Lab Results   Component Value Date    Sodium 139 01/04/2021    Potassium 4.6 01/04/2021    Chloride 107 01/04/2021    CO2 23.0 01/04/2021    BUN 50 (H) 01/04/2021    Creatinine 2.23 (H) 01/04/2021    Magnesium 1.8 12/07/2020     Lab Results   Component Value Date    HGB 13.9 01/04/2021    Hemoglobin, POC 12.5 (L) 11/30/2020    MCV 82.1 01/04/2021    Platelet 104 (L) 01/04/2021     Lab Results   Component Value Date  HGB A1C, POC 9.9 (H) 12/31/2020    TSH 3.009 11/24/2020    PRO-BNP 2,260.0 (H) 08/26/2019    INR 1.14 11/12/2020         *Patient note was created using dragon dictation. Any errors in syntax or proofreading may not have been identified and edited on initial review prior to signing this note.    Thank you very much for allowing me the opportunity to participate in the care of Colin Hunter, who is a delightful patient.  Please do not hesitate to call me if you have any questions.      Sincerely,    [name], MD, [facility], [facility]  Interventional Cardiology  Associate Professor of Medicine  [place] [facility] [place] at [facility]

## 2021-01-22 NOTE — Unmapped (Signed)
ACHD Intake Questionnaire     Since his last visit, Colin Hunter has had an increase of: dizziness, swelling has improved  Exercise: Yes; swimming  Energy level:excellent  Recent changes in weight:No    Sexual Health  Sexually active: Yes   Sex drive:good   Contraceptive type: None    Routine Care  PCP: Salem Caster, MD  Dentist: needs a new dentist   Last cleaning: more than a year since last cleaning    Mental Health Screening  PHQ-2:0  GAD-2: 0    Vaccination History  COVID vaccine: Fully vaccinated and boosted  Flu vaccine: vaccinated

## 2021-01-26 ENCOUNTER — Institutional Professional Consult (permissible substitution): Admit: 2021-01-26 | Discharge: 2021-01-27 | Payer: MEDICARE

## 2021-01-26 NOTE — Unmapped (Signed)
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 today to check in related to how he's doing after recently starting asciminib and recent lab check. He feels well, denies nausea, diarrhea, constipation, skin irritation, chest pain, SOB, dizziness, abdominal pain. Appetite and energy have been good. He has been taking asciminib twice a day every day on an empty stomach but does admit that he has missed 2 doses over the past month or so because he can't take the dose exactly 12 hours apart. We discussed it is more important to take it even it is 1-2 hours early or late. Has been taking Eliquis as instructed and denies any bleeding or bruising.   ??  Labs ([facility], 01/20/21): CBC with WBC 7.1; ANC 3.9; Hgb 15.5; PLT 124; CMP with creatinine 2.48 (history of CKD); gluc 185    Oncologic History:  Oncology History Overview Note   CML, chronic phase:   Leukocytosis to ~30 at time of diagnosis with normal platelet count and Hgb.     Bone marrow biopsy 06/26/2015:   CML, chronic phase (2% blasts, 2% promyelocytes)    Aspirate Smear:   The aspirate smear is hypercellular with rare small spicules.   Myeloid elements are increased with full maturation, without significant   cytologic atypia. Erythroid elements are markedly decreased, without significant cytologic atypia. The myeloid to erythroid ratio is >10:1. Many small hypolobated megakaryocytes are present. Storage iron is present; ringed sideroblasts are not identified.     A differential count of 200 nucleated cells shows the following   percentages:   2 Blasts   2 Promyelocytes   31 Myelocytes/Metamyelocytes   51 Bands/Neutrophils   3 Monocytes   3 Eosinophils   1 Basophils   1 Lymphocytes   0 Plasma cells   6 NRBCs     Biopsy:   H&E and PAS stained sections show unremarkable trabecular bone and markedly hypercellular marrow for age (95% cellularity). The myeloid to erythroid ratio is >5:1. Erythroid elements are decreased with full maturation. Myeloid elements are markedly increased with full maturation.   Megakaryocytes are increased with many small hypolobated forms.   Scattered small lymphocytes and plasma cells account for <5% of   cellularity. A reticulin stain shows grade 1 of 3 fibrosis (European Consensus System).     Flow cytometry:   Flow cytometry performed on the bone marrow aspirate ([id number])   demonstrates a non-discrete population of blasts and maturing   myelomonocytic cells within the blast gate (<2% of total events).     CBC:   A tandem CBC shows: WBC 27.9 (55.7% neutrophils, 0.9% bands, 0.9%   metamyelocytes, 11.3% myelocytes, 10.4% lymphocytes, 13.2% monocytes, 1.0%   eosinophils, 4.7% basophils), Hgb 13.4, MCV 87, Plt 224.     Treatment:   Imatinib, 400mg , 06/26/2015-05/17/2019  Bosutiib, 400 mg 10/04/2019 -     BCR/ABL testing:   06/2015: 87.086%  07/17/2015: 140.922%  08/18/2015: 75.693%  12/08/2015: 2.144%  01/07/2016: 2.450%  03/29/2016: 2.008%  07/26/2016: 2.871%; Kinase domain mutation testing negative  10/28/2016: 1.247%  01/05/2017: 0.641%  03/14/2017: 0.348%; Kinase domain mutation testing negative  06/25/19: Result not available   07/26/19: Result not available      07/26/19  FISH: positive t(9;22) in ~90.5% of cells     Final Diagnosis   Date Value Ref Range Status   08/20/2019   Final    Bone marrow, right iliac, aspiration and biopsy  -  Hypercellular bone marrow (95%) involved by chronic myeloid leukemia, BCR-ABL1-positive, chronic phase (2% blasts by manual aspirate differential)  -  See linked reports for associated Ancillary Studies.      This electronic signature is attestation that the pathologist personally reviewed the submitted material(s) and the final diagnosis reflects that evaluation. 08/12/19 BCR/ABL: 28.427 %  08/20/19 BCR/ABL: 35.897%    08/12/19 TKI Resistance Testing: Negative    10/04/19: started bosutinib 400 mg daily    Current treatment:   Bosutinib 400 mg daily     Chronic myeloid leukemia (CMS-HCC)   06/26/2015 Initial Diagnosis    Chronic myeloid leukemia (CMS-HCC)     06/26/2015 - 05/17/2019 Chemotherapy    Imatinib, 400mg  daily; discontinued due to worsening edema     10/04/2019 - 11/2020 Chemotherapy    Bosutinib 400mg      11/11/2020 Progression    Loss of hematologic response with neutrophilia, monocytosis, eosinophilia but not basophilia. Possibly due to non-adherence and association of LE edema with bosutinib as potential toxicity--lack of molecular response but no BCR-ABL point mutations     12/10/2020 -  Chemotherapy    Asciminib 40 mg PO BID     01/04/2021 Remission    Complete hematologic response         Weight and Vitals:  Wt Readings from Last 3 Encounters:   01/22/21 (!) 117 kg (258 lb)   01/04/21 (!) 117 kg (258 lb)   12/31/20 (!) 117.1 kg (258 lb 3.2 oz)     Temp Readings from Last 3 Encounters:   01/04/21 36.3 ??C (97.4 ??F) (Temporal)   12/31/20 36.5 ??C (97.7 ??F) (Oral)   12/07/20 36.5 ??C (97.7 ??F) (Temporal)     BP Readings from Last 3 Encounters:   01/22/21 137/91   01/06/21 158/62   01/04/21 156/92     Pulse Readings from Last 3 Encounters:   01/22/21 80   01/06/21 84   01/04/21 81       Pertinent Labs:  No visits with results within 1 Day(s) from this visit.   Latest known visit with results is:   Lab on 01/04/2021   Component Date Value Ref Range Status   ??? Sodium 01/04/2021 139  135 - 145 mmol/L Final   ??? Potassium 01/04/2021 4.6  3.4 - 4.8 mmol/L Final   ??? Chloride 01/04/2021 107  98 - 107 mmol/L Final   ??? CO2 01/04/2021 23.0  20.0 - 31.0 mmol/L Final   ??? Anion Gap 01/04/2021 9  5 - 14 mmol/L Final   ??? BUN 01/04/2021 50 (A) 9 - 23 mg/dL Final   ??? Creatinine 01/04/2021 2.23 (A) 0.60 - 1.10 mg/dL Final   ??? BUN/Creatinine Ratio 01/04/2021 22   Final   ??? eGFR CKD-EPI (2021) Male 01/04/2021 35 (A) >=60 mL/min/1.60m2 Final    eGFR calculated with CKD-EPI 2021 equation in accordance with [employer] and [employer] of Nephrology Task Force recommendations.   ??? Glucose 01/04/2021 249 (A) 70 - 179 mg/dL Final   ??? Calcium 16/01/9603 9.8  8.7 - 10.4 mg/dL Final   ??? Albumin 54/12/8117 3.8  3.4 - 5.0 g/dL Final   ??? Total Protein 01/04/2021 7.7  5.7 - 8.2 g/dL Final   ??? Total Bilirubin 01/04/2021 0.7  0.3 - 1.2 mg/dL Final   ??? AST 96/07/5407 37 (A) <=34 U/L Final   ??? ALT 01/04/2021 37  10 - 49 U/L Final   ??? Alkaline Phosphatase 01/04/2021 309 (A) 46 - 116 U/L Final   ??? LDH 01/04/2021 291 (A) 120 - 246 U/L Final   ??? Uric Acid 01/04/2021 5.7  3.7 - 9.2 mg/dL Final   ??? Check Type 01/04/2021 Need Type Check   Final    2nd sample is required for [facility] confirmation.   ??? ABO Grouping 01/04/2021 O POS   Final   ??? Antibody Screen 01/04/2021 NEG   Final   ??? WBC 01/04/2021 5.7  3.6 - 11.2 10*9/L Final   ??? RBC 01/04/2021 5.06  4.26 - 5.60 10*12/L Final   ??? HGB 01/04/2021 13.9  12.9 - 16.5 g/dL Final   ??? HCT 81/19/1478 41.5  39.0 - 48.0 % Final   ??? MCV 01/04/2021 82.1  77.6 - 95.7 fL Final   ??? MCH 01/04/2021 27.4  25.9 - 32.4 pg Final   ??? MCHC 01/04/2021 33.4  32.0 - 36.0 g/dL Final   ??? RDW 29/56/2130 17.3 (A) 12.2 - 15.2 % Final   ??? MPV 01/04/2021 10.4  6.8 - 10.7 fL Final   ??? Platelet 01/04/2021 104 (A) 150 - 450 10*9/L Final   ??? Neutrophils % 01/04/2021 62.6  % Final   ??? Lymphocytes % 01/04/2021 23.7  % Final   ??? Monocytes % 01/04/2021 8.6  % Final   ??? Eosinophils % 01/04/2021 4.3  % Final   ??? Basophils % 01/04/2021 0.8  % Final   ??? Absolute Neutrophils 01/04/2021 3.6  1.8 - 7.8 10*9/L Final   ??? Absolute Lymphocytes 01/04/2021 1.4  1.1 - 3.6 10*9/L Final   ??? Absolute Monocytes 01/04/2021 0.5  0.3 - 0.8 10*9/L Final   ??? Absolute Eosinophils 01/04/2021 0.2  0.0 - 0.5 10*9/L Final   ??? Absolute Basophils 01/04/2021 0.0  0.0 - 0.1 10*9/L Final   ??? Anisocytosis 01/04/2021 Slight (A) Not Present Final       Allergies: No Known Allergies    Drug Interactions:  CYP2C9 Inhibitors (Moderate) such as asciminib may increase the serum concentration of torsemide. Cardiology can titrate to effect/avoid toxicity      Current Medications:  Current Outpatient Medications   Medication Sig Dispense Refill   ??? apixaban (ELIQUIS) 5 mg Tab Take 1 tablet (5 mg total) by mouth Two (2) times a day. 60 tablet 12   ??? asciminib (SCEMBLIX) 40 mg tablet Take 1 tablet (40 mg total) by mouth Two (2) times a day. Take on an empty stomach, at least 1 hour before or 2 hours after a meal. Swallow tablets whole. Do not break, crush, or chew the tablets. 60 tablet 11   ??? BINAXNOW COVID-19 AG SELF TEST Kit      ??? blood sugar diagnostic Strp      ??? blood-glucose meter Misc      ??? folic acid (FOLVITE) 1 MG tablet Take 1 tablet (1 mg total) by mouth in the morning. 90 tablet 3   ??? HUMULIN R REGULAR U-100 INSULN 100 unit/mL injection INJECT 15 UNITS SUBCUTANEOUSLY THREE TIMES DAILY BEFORE MEAL(S)     ??? insulin glargine (BASAGLAR, LANTUS) 100 unit/mL (3 mL) injection pen Inject 0.27 mL (27 Units total) under the skin nightly. 15 mL 12   ??? insulin syringe-needle U-100 0.5 mL 29 gauge x 1/2 (12 mm) Syrg Use to inject  4 times daily as directed.     ??? lancets Misc by Miscellaneous route.     ??? metoprolol succinate (TOPROL-XL) 100 MG 24 hr tablet Take 1 tablet (100 mg total) by mouth daily. 30 tablet 1   ??? ONETOUCH VERIO TEST STRIPS Strp USE 1 STRIP TO CHECK GLUCOSE TWICE DAILY     ??? OZEMPIC 0.25 mg or 0.5 mg(2 mg/1.5 mL) PnIj injection Inject 0.5 mg under the skin every seven (7) days.     ??? pantoprazole (PROTONIX) 40 MG tablet Take 40 mg by mouth daily.     ??? pen needle, diabetic 29 gauge x 1/2 (12 mm) Ndle Please substitute for cheapest generic available. Diagnosis E11.9.Z79.4 110 each 0   ??? pregabalin (LYRICA) 100 MG capsule Take 100 mg by mouth Three (3) times a day.     ??? rosuvastatin (CRESTOR) 20 MG tablet Take 1 tablet (20 mg total) by mouth daily. 90 tablet 0   ??? simethicone (MYLICON) 80 MG chewable tablet Chew 1 tablet (80 mg total) every six (6) hours as needed for flatulence. 60 tablet 2   ??? torsemide (DEMADEX) 20 MG tablet Take by mouth two (2) times a day. 40 mg in the morning and 20 mg in the evening       No current facility-administered medications for this visit.       AAdherence: Missed two doses in past month due to timing; Encouraged perfect adherence. If he forgets often, can still consider 80 mg daily dosing  ??  Assessment: Mr.Haywood is a 50 y.o. 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 Other than CML, Mr [name] is also working with cardiology regarding his pulmonic stenosis, Aflutter, and CHF. He is on anticoagulation. He feels well as it relates to asciminib and no new issues noted, his recent labs are stbale.   ??  Plan:   1) CML  - Continue asciminib 40 mg BID   - See above comments in adherence related to option to transition to 80 mg daily in the future   ??  2) TLS prophylaxis/ history of gout  - Continue on increased allopurinol dose of 200 mg daily given recently elevated uric acid and CKD. Can likely increase dose further but can be followed by PCP as low risk for CML related TLS at this point  ??  3) Supportive care  - Simethicone every 6 hours prn for gas/belching  ??  Ronnald Collum, PharmD, BCOP, CPP  Hematology/Oncology Clinical Pharmacist  Pager [phone]

## 2021-01-26 NOTE — Unmapped (Deleted)
Colin Hunter is a 50 y.o. 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 He feels well, denies nausea, diarrhea, constipation, skin irritation, chest pain, SOB, dizziness, abdominal pain. Appetite and energy have been good. He has been taking asciminib twice a day every day on an empty stomach but does admit that he has missed 2 doses over the past month or so because he can't take the dose exactly 12 hours apart. We discussed it is more important to take it even it is 1-2 hours early or late. Has been taking Eliquis as instructed and denies any bleeding or bruising.     Labs (CE, 01/20/21): CBC with WBC 7.1; ANC 3.9; Hgb 15.5; PLT 124; CMP with creatinine 2.48 (history of CKD); gluc 185    Oncologic History:  Oncology History Overview Note   CML, chronic phase:   Leukocytosis to ~30 at time of diagnosis with normal platelet count and Hgb.     Bone marrow biopsy 06/26/2015:   CML, chronic phase (2% blasts, 2% promyelocytes)    Aspirate Smear:   The aspirate smear is hypercellular with rare small spicules.   Myeloid elements are increased with full maturation, without significant   cytologic atypia. Erythroid elements are markedly decreased, without significant cytologic atypia. The myeloid to erythroid ratio is >10:1. Many small hypolobated megakaryocytes are present. Storage iron is present; ringed sideroblasts are not identified.     A differential count of 200 nucleated cells shows the following   percentages:   2 Blasts   2 Promyelocytes   31 Myelocytes/Metamyelocytes   51 Bands/Neutrophils   3 Monocytes   3 Eosinophils   1 Basophils   1 Lymphocytes   0 Plasma cells   6 NRBCs Biopsy:   H&E and PAS stained sections show unremarkable trabecular bone and markedly hypercellular marrow for age (95% cellularity). The myeloid to erythroid ratio is >5:1. Erythroid elements are decreased with full maturation. Myeloid elements are markedly increased with full maturation.   Megakaryocytes are increased with many small hypolobated forms.   Scattered small lymphocytes and plasma cells account for <5% of   cellularity. A reticulin stain shows grade 1 of 3 fibrosis (European Consensus System).     Flow cytometry:   Flow cytometry performed on the bone marrow aspirate ([id number])   demonstrates a non-discrete population of blasts and maturing   myelomonocytic cells within the blast gate (<2% of total events).     CBC:   A tandem CBC shows: WBC 27.9 (55.7% neutrophils, 0.9% bands, 0.9%   metamyelocytes, 11.3% myelocytes, 10.4% lymphocytes, 13.2% monocytes, 1.0%   eosinophils, 4.7% basophils), Hgb 13.4, MCV 87, Plt 224.     Treatment:   Imatinib, 400mg , 06/26/2015-05/17/2019  Bosutiib, 400 mg 10/04/2019 -     BCR/ABL testing:   06/2015: 87.086%  07/17/2015: 140.922%  08/18/2015: 75.693%  12/08/2015: 2.144%  01/07/2016: 2.450%  03/29/2016: 2.008%  07/26/2016: 2.871%; Kinase domain mutation testing negative  10/28/2016: 1.247%  01/05/2017: 0.641%  03/14/2017: 0.348%; Kinase domain mutation testing negative  06/25/19: Result not available   07/26/19: Result not available      07/26/19 FISH: positive t(9;22) in ~  90.5% of cells     Final Diagnosis   Date Value Ref Range Status   08/20/2019   Final    Bone marrow, right iliac, aspiration and biopsy  -  Hypercellular bone marrow (95%) involved by chronic myeloid leukemia, BCR-ABL1-positive, chronic phase (2% blasts by manual aspirate differential)  -  See linked reports for associated Ancillary Studies.      This electronic signature is attestation that the pathologist personally reviewed the submitted material(s) and the final diagnosis reflects that evaluation. 08/12/19 BCR/ABL: 28.427 %  08/20/19 BCR/ABL: 35.897%    08/12/19 TKI Resistance Testing: Negative    10/04/19: started bosutinib 400 mg daily    Current treatment:   Bosutinib 400 mg daily     Chronic myeloid leukemia (CMS-HCC)   06/26/2015 Initial Diagnosis    Chronic myeloid leukemia (CMS-HCC)     06/26/2015 - 05/17/2019 Chemotherapy    Imatinib, 400mg  daily; discontinued due to worsening edema     10/04/2019 - 11/2020 Chemotherapy    Bosutinib 400mg      11/11/2020 Progression    Loss of hematologic response with neutrophilia, monocytosis, eosinophilia but not basophilia. Possibly due to non-adherence and association of LE edema with bosutinib as potential toxicity--lack of molecular response but no BCR-ABL point mutations     12/10/2020 -  Chemotherapy    Asciminib 40 mg PO BID     01/04/2021 Remission    Complete hematologic response         Weight and Vitals:  Wt Readings from Last 3 Encounters:   01/22/21 (!) 117 kg (258 lb)   01/04/21 (!) 117 kg (258 lb)   12/31/20 (!) 117.1 kg (258 lb 3.2 oz)     Temp Readings from Last 3 Encounters:   01/04/21 36.3 ??C (97.4 ??F) (Temporal)   12/31/20 36.5 ??C (97.7 ??F) (Oral)   12/07/20 36.5 ??C (97.7 ??F) (Temporal)     BP Readings from Last 3 Encounters:   01/22/21 137/91   01/06/21 158/62   01/04/21 156/92     Pulse Readings from Last 3 Encounters:   01/22/21 80   01/06/21 84   01/04/21 81       Pertinent Labs:  No visits with results within 1 Day(s) from this visit.   Latest known visit with results is:   Lab on 01/04/2021   Component Date Value Ref Range Status   ??? Sodium 01/04/2021 139  135 - 145 mmol/L Final   ??? Potassium 01/04/2021 4.6  3.4 - 4.8 mmol/L Final   ??? Chloride 01/04/2021 107  98 - 107 mmol/L Final   ??? CO2 01/04/2021 23.0  20.0 - 31.0 mmol/L Final   ??? Anion Gap 01/04/2021 9  5 - 14 mmol/L Final   ??? BUN 01/04/2021 50 (A) 9 - 23 mg/dL Final   ??? Creatinine 01/04/2021 2.23 (A) 0.60 - 1.10 mg/dL Final   ??? BUN/Creatinine Ratio 01/04/2021 22   Final   ??? eGFR CKD-EPI (2021) Male 01/04/2021 35 (A) >=60 mL/min/1.50m2 Final    eGFR calculated with CKD-EPI 2021 equation in accordance with [employer] and [employer] of Nephrology Task Force recommendations.   ??? Glucose 01/04/2021 249 (A) 70 - 179 mg/dL Final   ??? Calcium 63/87/5643 9.8  8.7 - 10.4 mg/dL Final   ??? Albumin 32/95/1884 3.8  3.4 - 5.0 g/dL Final   ??? Total Protein 01/04/2021 7.7  5.7 - 8.2 g/dL Final   ??? Total Bilirubin 01/04/2021 0.7  0.3 - 1.2 mg/dL  Final   ??? AST 01/04/2021 37 (A) <=34 U/L Final   ??? ALT 01/04/2021 37  10 - 49 U/L Final   ??? Alkaline Phosphatase 01/04/2021 309 (A) 46 - 116 U/L Final   ??? LDH 01/04/2021 291 (A) 120 - 246 U/L Final   ??? Uric Acid 01/04/2021 5.7  3.7 - 9.2 mg/dL Final   ??? Check Type 01/04/2021 Need Type Check   Final    2nd sample is required for [facility] confirmation.   ??? ABO Grouping 01/04/2021 O POS   Final   ??? Antibody Screen 01/04/2021 NEG   Final   ??? WBC 01/04/2021 5.7  3.6 - 11.2 10*9/L Final   ??? RBC 01/04/2021 5.06  4.26 - 5.60 10*12/L Final   ??? HGB 01/04/2021 13.9  12.9 - 16.5 g/dL Final   ??? HCT 09/81/1914 41.5  39.0 - 48.0 % Final   ??? MCV 01/04/2021 82.1  77.6 - 95.7 fL Final   ??? MCH 01/04/2021 27.4  25.9 - 32.4 pg Final   ??? MCHC 01/04/2021 33.4  32.0 - 36.0 g/dL Final   ??? RDW 78/29/5621 17.3 (A) 12.2 - 15.2 % Final   ??? MPV 01/04/2021 10.4  6.8 - 10.7 fL Final   ??? Platelet 01/04/2021 104 (A) 150 - 450 10*9/L Final   ??? Neutrophils % 01/04/2021 62.6  % Final   ??? Lymphocytes % 01/04/2021 23.7  % Final   ??? Monocytes % 01/04/2021 8.6  % Final   ??? Eosinophils % 01/04/2021 4.3  % Final   ??? Basophils % 01/04/2021 0.8  % Final   ??? Absolute Neutrophils 01/04/2021 3.6  1.8 - 7.8 10*9/L Final   ??? Absolute Lymphocytes 01/04/2021 1.4  1.1 - 3.6 10*9/L Final   ??? Absolute Monocytes 01/04/2021 0.5  0.3 - 0.8 10*9/L Final   ??? Absolute Eosinophils 01/04/2021 0.2  0.0 - 0.5 10*9/L Final   ??? Absolute Basophils 01/04/2021 0.0  0.0 - 0.1 10*9/L Final   ??? Anisocytosis 01/04/2021 Slight (A) Not Present Final       Allergies: No Known Allergies    Drug Interactions:  CYP2C9 Inhibitors (Moderate) such as asciminib may increase the serum concentration of torsemide. Cardiology can titrate to effect/avoid toxicity      Current Medications:  Current Outpatient Medications   Medication Sig Dispense Refill   ??? apixaban (ELIQUIS) 5 mg Tab Take 1 tablet (5 mg total) by mouth Two (2) times a day. 60 tablet 12   ??? asciminib (SCEMBLIX) 40 mg tablet Take 1 tablet (40 mg total) by mouth Two (2) times a day. Take on an empty stomach, at least 1 hour before or 2 hours after a meal. Swallow tablets whole. Do not break, crush, or chew the tablets. 60 tablet 11   ??? BINAXNOW COVID-19 AG SELF TEST Kit      ??? blood sugar diagnostic Strp      ??? blood-glucose meter Misc      ??? folic acid (FOLVITE) 1 MG tablet Take 1 tablet (1 mg total) by mouth in the morning. 90 tablet 3   ??? HUMULIN R REGULAR U-100 INSULN 100 unit/mL injection INJECT 15 UNITS SUBCUTANEOUSLY THREE TIMES DAILY BEFORE MEAL(S)     ??? insulin glargine (BASAGLAR, LANTUS) 100 unit/mL (3 mL) injection pen Inject 0.27 mL (27 Units total) under the skin nightly. 15 mL 12   ??? insulin syringe-needle U-100 0.5 mL 29 gauge x 1/2 (12 mm) Syrg Use to inject 4 times daily as  directed.     ??? lancets Misc by Miscellaneous route.     ??? metoprolol succinate (TOPROL-XL) 100 MG 24 hr tablet Take 1 tablet (100 mg total) by mouth daily. 30 tablet 1   ??? ONETOUCH VERIO TEST STRIPS Strp USE 1 STRIP TO CHECK GLUCOSE TWICE DAILY     ??? OZEMPIC 0.25 mg or 0.5 mg(2 mg/1.5 mL) PnIj injection Inject 0.5 mg under the skin every seven (7) days.     ??? pantoprazole (PROTONIX) 40 MG tablet Take 40 mg by mouth daily.     ??? pen needle, diabetic 29 gauge x 1/2 (12 mm) Ndle Please substitute for cheapest generic available. Diagnosis E11.9.Z79.4 110 each 0   ??? pregabalin (LYRICA) 100 MG capsule Take 100 mg by mouth Three (3) times a day.     ??? rosuvastatin (CRESTOR) 20 MG tablet Take 1 tablet (20 mg total) by mouth daily. 90 tablet 0   ??? simethicone (MYLICON) 80 MG chewable tablet Chew 1 tablet (80 mg total) every six (6) hours as needed for flatulence. 60 tablet 2   ??? torsemide (DEMADEX) 20 MG tablet Take by mouth two (2) times a day. 40 mg in the morning and 20 mg in the evening       No current facility-administered medications for this visit.       Adherence: Missed two doses in past month due to timing; Encouraged perfect adherence. If he forgets often, can still consider 80 mg daily dosing    Assessment: Colin Hunter is a 50 y.o. 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 Other than CML, Mr Colin Hunter is also working with cardiology regarding his pulmonic stenosis, Aflutter, and CHF. He is on anticoagulation. He feels well as it relates to asciminib and no new issues noted, his recent labs are stbale.     Plan:   1) CML  - Continue asciminib 40 mg BID   - See above comments in adherence related to option to transition to 80 mg daily in the future     2) TLS prophylaxis/ history of gout  - Continue on increased allopurinol dose of 200 mg daily given recently elevated uric acid and CKD. Can likely increase dose further but can be followed by PCP as low risk for CML related TLS at this point    3) Supportive care  - Simethicone to local pharmacy, he can take every 6 hours prn for gas/belching    Ronnald Collum, PharmD, BCOP, CPP  Hematology/Oncology Clinical Pharmacist  Pager [phone]

## 2021-02-01 NOTE — Unmapped (Signed)
[facility] Refill Coordination Note    Specialty Medication(s) to be Shipped:   Hematology/Oncology: Scemblix 40mg   Other medication(s) to be shipped: No additional medications requested for fill at this time     Colin Hunter, DOB: 07-Dec-1970  Phone: [phone] (home)     All above HIPAA information was verified with patient.     Was a Nurse, learning disability used for this call? No    Completed refill call assessment today to schedule patient's medication shipment from the [facility] [phone]).  All relevant notes have been reviewed.     Specialty medication(s) and dose(s) confirmed: Regimen is correct and unchanged.   Changes to medications: [name] reports no changes at this time.  Changes to insurance: No  New side effects reported not previously addressed with a pharmacist or physician: None reported  Questions for the pharmacist: No    Confirmed patient received a Conservation officer, historic buildings and a Surveyor, mining with first shipment. The patient will receive a drug information handout for each medication shipped and additional FDA Medication Guides as required.       DISEASE/MEDICATION-SPECIFIC INFORMATION        N/A    SPECIALTY MEDICATION ADHERENCE     Medication Adherence    Patient reported X missed doses in the last month: 0  Specialty Medication: Scemblix 40mg   Patient is on additional specialty medications: No  Patient is on more than two specialty medications: No  Informant: patient  Reliability of informant: reliable  Reasons for non-adherence: no problems identified  Confirmed plan for next specialty medication refill: delivery by pharmacy  Refills needed for supportive medications: not needed      Were doses missed due to medication being on hold? No    Scemblix 40mg : 4 days of medicine on hand     REFERRAL TO PHARMACIST     Referral to the pharmacist: Not needed    [facility]     Shipping address confirmed in Epic.     Delivery Scheduled: Yes, Expected medication delivery date: 02/03/2021.     Medication will be delivered via UPS to the prescription address in Epic WAM.    Colin Hunter Shared [facility] Specialty Technician

## 2021-02-02 MED FILL — SCEMBLIX 40 MG TABLET: ORAL | 30 days supply | Qty: 60 | Fill #2

## 2021-02-10 DIAGNOSIS — C9210 Chronic myeloid leukemia, BCR/ABL-positive, not having achieved remission: Principal | ICD-10-CM

## 2021-02-11 ENCOUNTER — Ambulatory Visit: Admit: 2021-02-11 | Payer: MEDICARE

## 2021-02-11 NOTE — Unmapped (Unsigned)
asciminib 40 mg BID  [facility] - appt set

## 2021-02-12 ENCOUNTER — Ambulatory Visit: Admit: 2021-02-12 | Payer: MEDICARE | Attending: "Endocrinology | Primary: "Endocrinology

## 2021-02-12 NOTE — Unmapped (Unsigned)
Endocrinology Clinic Initial Visit Note    ASSESSMENT AND PLAN:     Colin Hunter is a 50 y.o. male with a PMHx of *** who I am asked to see in consultation by Pixie Casino for evaluation of diabetes.     1. Diabetes Mellitus Type *****    Impression:      Consider SGLT2, he was on DAPA in the past       Recommendation:  -- Counseled the patient on the importance of low carb diet and discussed plate method.   -- Counseled the patient on Importance of exercise   -- His target HbA1C is ******  -- Continue*************  -- Discontinue********  -- Medications refilled for 6 months.   -- Continue to check BG in the AM, pre prandial (has to be fasting for 2 hours before checking), and at bedtime.   -- Counseled on hypoglycemic symptoms and if that happens the patient will need to check BG and eat something.   --The patient is interested in CGM,****is on daily injection of****** or insulin pump.the patient has been checking his blood glucose at home performing fingersticks 4 times a day for the last 30 days.     Diabetes care/complications:   A. Retinopathy:   B. Nephropathy:   C. Neuropathy:   D. Lipids:   E. Macrovascular:   F: Blood pressure:   G: Glucagon  H: Thyroid function test:    All tests and treatments were discussed with the patient who agreed to the plans as documented. They currently have no questions and agree to call the clinic or contact us if questions arise.    Patient was seen and staffed with ***.    Pamalee Leyden, M.D. PGY-4 Endocrinology Fellow  [facility] [place]  Phone:  [phone]     Fax:  [phone]      SUBJECTIVE:     Colin Hunter is a 50 y.o. 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             Interval History:      HbA1C today:    Non-Insulin Medications: Ozempic 0.5 weekly.   Insulin: Lantus 27 units, humalin 15u at meals.     Diet:         Breakfast:         Lunch:         Dinner:     Exercise***  Home reading:         AM:         Pre lunch:         Pre dinner:          Bed time:   The patient has a *** CGM, 30-day avg BG reviewed *** with *** Variability, *****.    *****Hyperglycemic symptoms including excessive thirst and hunger, polyuria  *****Hypoglycemic symptoms including tremors, sweating, dizziness, anxiety    Prior visit:            Regarding DM history:  Patient was diagnosed with DM type*** in ***  Circumstances at time of diagnosis***  Microvascular complications***  Macrovascular complications***  Previous Medications***  Non-Insulin Medications***  Insulin ***  Last UMAC ***in ***  Last LDL *** in ***  Follows with Ophthalmology, last eye exam***  Family history  of DM*****      Medical History Surgical History   Past Medical History:   Diagnosis Date   ??? CKD (chronic kidney disease) stage 3, GFR 30-59 ml/min (CMS-HCC)    ??? CML (chronic myelocytic leukemia) (CMS-HCC)    ??? HFrEF (heart failure with reduced ejection fraction) (CMS-HCC)    ??? Paroxysmal atrial fibrillation (CMS-HCC)    ??? PFO (patent foramen ovale)    ??? Right atrial thrombus    ??? T2DM (type 2 diabetes mellitus) (CMS-HCC)       Past Surgical History:   Procedure Laterality Date   ??? PR COMPRE EP EVAL ABLTJ 3D MAPG TX SVT N/A 12/03/2020    Procedure: A-Flutter Ablation;  Surgeon: Dorcas Carrow, MD;  Location: [facility] EP;  Service: Cardiology   ??? PR RIGHT HEART CATH O2 SATURATION & CARDIAC OUTPUT N/A 11/30/2020    Procedure: Right Heart Catheterization;  Surgeon: Marlaine Hind, MD;  Location: [facility] CATH;  Service: Cardiology          Social History Family History   Social History     Tobacco Use   ??? Smoking status: Former Smoker     Packs/day: 0.50     Years: 6.00     Pack years: 3.00     Types: Cigarettes     Quit date: 09/11/2018     Years since quitting: 2.4   ??? Smokeless tobacco: Never Used   Substance Use Topics   ??? Alcohol use: Never      Family History   Problem Relation Age of Onset   ??? Heart disease Father           Medications     Current Outpatient Medications:   ???  apixaban (ELIQUIS) 5 mg Tab, Take 1 tablet (5 mg total) by mouth Two (2) times a day., Disp: 60 tablet, Rfl: 12  ???  asciminib (SCEMBLIX) 40 mg tablet, Take 1 tablet (40 mg total) by mouth Two (2) times a day. Take on an empty stomach, at least 1 hour before or 2 hours after a meal. Swallow tablets whole. Do not break, crush, or chew the tablets., Disp: 60 tablet, Rfl: 11  ???  BINAXNOW COVID-19 AG SELF TEST Kit, , Disp: , Rfl:   ???  blood sugar diagnostic Strp, , Disp: , Rfl:   ???  blood-glucose meter Misc, , Disp: , Rfl:   ???  folic acid (FOLVITE) 1 MG tablet, Take 1 tablet (1 mg total) by mouth in the morning., Disp: 90 tablet, Rfl: 3  ???  HUMULIN R REGULAR U-100 INSULN 100 unit/mL injection, INJECT 15 UNITS SUBCUTANEOUSLY THREE TIMES DAILY BEFORE MEAL(S), Disp: , Rfl:   ???  insulin glargine (BASAGLAR, LANTUS) 100 unit/mL (3 mL) injection pen, Inject 0.27 mL (27 Units total) under the skin nightly., Disp: 15 mL, Rfl: 12  ???  lancets Misc, by Miscellaneous route., Disp: , Rfl:   ???  metoprolol succinate (TOPROL-XL) 100 MG 24 hr tablet, Take 1 tablet (100 mg total) by mouth daily., Disp: 30 tablet, Rfl: 1  ???  ONETOUCH VERIO TEST STRIPS Strp, USE 1 STRIP TO CHECK GLUCOSE TWICE DAILY, Disp: , Rfl:   ???  OZEMPIC 0.25 mg or 0.5 mg(2 mg/1.5 mL) PnIj injection, Inject 0.5 mg under the skin every seven (7) days., Disp: , Rfl:   ???  pantoprazole (PROTONIX) 40 MG tablet, Take 40 mg by mouth daily., Disp: , Rfl:   ???  pen needle, diabetic 29 gauge x 1/2 (  12 mm) Ndle, Please substitute for cheapest generic available. Diagnosis E11.9.Z79.4, Disp: 110 each, Rfl: 0  ???  pregabalin (LYRICA) 100 MG capsule, Take 100 mg by mouth Three (3) times a day., Disp: , Rfl:   ???  rosuvastatin (CRESTOR) 20 MG tablet, Take 1 tablet (20 mg total) by mouth daily., Disp: 90 tablet, Rfl: 0  ???  simethicone (MYLICON) 80 MG chewable tablet, Chew 1 tablet (80 mg total) every six (6) hours as needed for flatulence., Disp: 60 tablet, Rfl: 2  ???  torsemide (DEMADEX) 20 MG tablet, Take by mouth two (2) times a day. 40 mg in the morning and 20 mg in the evening, Disp: , Rfl:          Allergies   No Known Allergies       Review of Systems: 10 systems reviewed and were negative except for as noted in HPI and below.    OBJECTIVE:     Physical Exam:  There were no vitals taken for this visit.  General: Well-appearing male in no apparent distress  HEENT: EOMI, sclera anicteric  Cardiovascular: Regular rate and rhythm; no murmurs, rubs, or gallops  Respiratory: Clear to auscultation bilaterally; no increased work of breathing on RA  Abdominal: Soft, non-distended  MSK: Normal station and gait  Neuro: Awake, alert, and oriented; no focal deficits  Psych: Normal mood and affect    Labs:  Lab Results   Component Value Date    A1C 9.9 (H) 12/31/2020    A1C >14.0 (H) 11/11/2020     No results found for: TRIG, CHOL, HDL, LDL  Lab Results   Component Value Date    TSH 3.009 11/24/2020     Lab Results   Component Value Date    CREATININE 2.23 (H) 01/04/2021     No results found for: MALBCRERAT  No results found for: CPEPTIDE  Lab on 01/04/2021   Component Date Value   ??? Sodium 01/04/2021 139    ??? Potassium 01/04/2021 4.6    ??? Chloride 01/04/2021 107    ??? CO2 01/04/2021 23.0    ??? Anion Gap 01/04/2021 9    ??? BUN 01/04/2021 50 (A)   ??? Creatinine 01/04/2021 2.23 (A)   ??? BUN/Creatinine Ratio 01/04/2021 22    ??? eGFR CKD-EPI (2021) Male 01/04/2021 35 (A)   ??? Glucose 01/04/2021 249 (A)   ??? Calcium 01/04/2021 9.8    ??? Albumin 01/04/2021 3.8    ??? Total Protein 01/04/2021 7.7    ??? Total Bilirubin 01/04/2021 0.7    ??? AST 01/04/2021 37 (A)   ??? ALT 01/04/2021 37    ??? Alkaline Phosphatase 01/04/2021 309 (A)   ??? LDH 01/04/2021 291 (A)   ??? Uric Acid 01/04/2021 5.7    ??? Check Type 01/04/2021 Need Type Check ??? ABO Grouping 01/04/2021 O POS    ??? Antibody Screen 01/04/2021 NEG    ??? WBC 01/04/2021 5.7    ??? RBC 01/04/2021 5.06    ??? HGB 01/04/2021 13.9    ??? HCT 01/04/2021 41.5    ??? MCV 01/04/2021 82.1    ??? [facility] 01/04/2021 27.4    ??? MCHC 01/04/2021 33.4    ??? RDW 01/04/2021 17.3 (A)   ??? MPV 01/04/2021 10.4    ??? Platelet 01/04/2021 104 (A)   ??? Neutrophils % 01/04/2021 62.6    ??? Lymphocytes % 01/04/2021 23.7    ??? Monocytes % 01/04/2021 8.6    ??? Eosinophils % 01/04/2021 4.3    ???  Basophils % 01/04/2021 0.8    ??? Absolute Neutrophils 01/04/2021 3.6    ??? Absolute Lymphocytes 01/04/2021 1.4    ??? Absolute Monocytes 01/04/2021 0.5    ??? Absolute Eosinophils 01/04/2021 0.2    ??? Absolute Basophils 01/04/2021 0.0    ??? Anisocytosis 01/04/2021 Slight (A)   Office Visit on 12/31/2020   Component Date Value   ??? HGB A1C, POC 12/31/2020 9.9 (A)   ??? EST AVERAGE GLUCOSE, POC 12/31/2020 237    Office Visit on 12/11/2020   Component Date Value   ??? Sodium 12/11/2020 138    ??? Potassium 12/11/2020 4.4    ??? Chloride 12/11/2020 108 (A)   ??? CO2 12/11/2020 22.0    ??? Anion Gap 12/11/2020 8    ??? BUN 12/11/2020 52 (A)   ??? Creatinine 12/11/2020 2.85 (A)   ??? BUN/Creatinine Ratio 12/11/2020 18    ??? eGFR CKD-EPI (2021) Male 12/11/2020 26 (A)   ??? Glucose 12/11/2020 194 (A)   ??? Calcium 12/11/2020 9.8    ??? EKG Ventricular Rate 12/11/2020 89    ??? EKG Atrial Rate 12/11/2020 89    ??? EKG P-R Interval 12/11/2020 180    ??? EKG QRS Duration 12/11/2020 164    ??? EKG Q-T Interval 12/11/2020 412    ??? EKG QTC Calculation 12/11/2020 501    ??? EKG Calculated P Axis 12/11/2020 55    ??? EKG Calculated R Axis 12/11/2020 15    ??? EKG Calculated T Axis 12/11/2020 57    ??? QTC Fredericia 12/11/2020 469    Lab on 12/07/2020   Component Date Value   ??? Sodium 12/07/2020 136    ??? Potassium 12/07/2020 4.7    ??? Chloride 12/07/2020 104    ??? CO2 12/07/2020 21.0    ??? Anion Gap 12/07/2020 11    ??? BUN 12/07/2020 54 (A)   ??? Creatinine 12/07/2020 3.20 (A)   ??? BUN/Creatinine Ratio 12/07/2020 17    ??? eGFR CKD-EPI (2021) Male 12/07/2020 23 (A)   ??? Glucose 12/07/2020 208 (A)   ??? Calcium 12/07/2020 9.6    ??? Albumin 12/07/2020 3.6    ??? Total Protein 12/07/2020 7.5    ??? Total Bilirubin 12/07/2020 0.8    ??? AST 12/07/2020 40 (A)   ??? ALT 12/07/2020 45    ??? Alkaline Phosphatase 12/07/2020 326 (A)   ??? Magnesium 12/07/2020 1.8    ??? LDH 12/07/2020 355 (A)   ??? Uric Acid 12/07/2020 11.6 (A)   ??? Phosphorus 12/07/2020 4.5    ??? WBC 12/07/2020 16.0 (A)   ??? RBC 12/07/2020 4.67    ??? HGB 12/07/2020 12.5 (A)   ??? HCT 12/07/2020 38.5 (A)   ??? MCV 12/07/2020 82.4    ??? [facility] 12/07/2020 26.7    ??? MCHC 12/07/2020 32.4    ??? RDW 12/07/2020 16.2 (A)   ??? MPV 12/07/2020 9.6    ??? Platelet 12/07/2020 170    ??? Neutrophils % 12/07/2020 66.0    ??? Lymphocytes % 12/07/2020 11.6    ??? Monocytes % 12/07/2020 20.6    ??? Eosinophils % 12/07/2020 1.6    ??? Basophils % 12/07/2020 0.2    ??? Absolute Neutrophils 12/07/2020 10.5 (A)   ??? Absolute Lymphocytes 12/07/2020 1.9    ??? Absolute Monocytes 12/07/2020 3.3 (A)   ??? Absolute Eosinophils 12/07/2020 0.3    ??? Absolute Basophils 12/07/2020 0.0    ??? Anisocytosis 12/07/2020 Slight (A)   ??? Hypochromasia 12/07/2020 Slight (A)   ???  Smear Review Comments 12/07/2020 See Comment (A)   ??? Polychromasia 12/07/2020 Slight (A)   ??? Burr Cells 12/07/2020 Present (A)   ??? Acanthocytes 12/07/2020 Present (A)   ??? Toxic Vacuolation 12/07/2020 Present (A)   ??? Poikilocytosis 12/07/2020 Moderate (A)   ??? Amylase 12/07/2020 96    ??? Lipase 12/07/2020 62 (A)   No results displayed because visit has over 200 results.      Office Visit on 11/24/2020   Component Date Value   ??? EKG Ventricular Rate 11/24/2020 81    ??? EKG Atrial Rate 11/24/2020 267    ??? EKG QRS Duration 11/24/2020 158    ??? EKG Q-T Interval 11/24/2020 418    ??? EKG QTC Calculation 11/24/2020 485    ??? EKG Calculated R Axis 11/24/2020 2    ??? EKG Calculated T Axis 11/24/2020 71    ??? QTC Fredericia 11/24/2020 461    Admission on 11/11/2020, Discharged on 11/12/2020   Component Date Value   ??? Sodium 11/11/2020 139    ??? Potassium 11/11/2020 5.5 (A)   ??? Chloride 11/11/2020 114 (A)   ??? CO2 11/11/2020 20.0    ??? Anion Gap 11/11/2020 5    ??? BUN 11/11/2020 45 (A)   ??? Creatinine 11/11/2020 2.54 (A)   ??? BUN/Creatinine Ratio 11/11/2020 18    ??? eGFR CKD-EPI (2021) Male 11/11/2020 30 (A)   ??? Glucose 11/11/2020 162    ??? Calcium 11/11/2020 9.2    ??? Albumin 11/11/2020 3.3 (A)   ??? Total Protein 11/11/2020 6.9    ??? Total Bilirubin 11/11/2020 0.5    ??? AST 11/11/2020 28    ??? ALT 11/11/2020 26    ??? Alkaline Phosphatase 11/11/2020 406 (A)   ??? hsTroponin I 11/11/2020 20    ??? EKG Ventricular Rate 11/11/2020 93    ??? EKG Atrial Rate 11/11/2020 277    ??? EKG QRS Duration 11/11/2020 166    ??? EKG Q-T Interval 11/11/2020 414    ??? EKG QTC Calculation 11/11/2020 514    ??? EKG Calculated P Axis 11/11/2020 42    ??? EKG Calculated R Axis 11/11/2020 -11    ??? EKG Calculated T Axis 11/11/2020 47    ??? QTC Fredericia 11/11/2020 479    ??? Magnesium 11/11/2020 1.8    ??? WBC 11/11/2020 17.1 (A)   ??? RBC 11/11/2020 3.73 (A)   ??? HGB 11/11/2020 10.1 (A)   ??? HCT 11/11/2020 31.7 (A)   ??? MCV 11/11/2020 85.0    ??? MCH 11/11/2020 27.1    ??? MCHC 11/11/2020 31.9 (A)   ??? RDW 11/11/2020 17.3 (A)   ??? MPV 11/11/2020 10.3    ??? Platelet 11/11/2020 247    ??? Neutrophils % 11/11/2020 70.8    ??? Lymphocytes % 11/11/2020 13.5    ??? Monocytes % 11/11/2020 11.2    ??? Eosinophils % 11/11/2020 4.3    ??? Basophils % 11/11/2020 0.2    ??? Absolute Neutrophils 11/11/2020 12.1 (A)   ??? Absolute Lymphocytes 11/11/2020 2.3    ??? Absolute Monocytes 11/11/2020 1.9 (A)   ??? Absolute Eosinophils 11/11/2020 0.7 (A)   ??? Absolute Basophils 11/11/2020 0.0    ??? Anisocytosis 11/11/2020 Slight (A)   ??? Smear Review Comments 11/11/2020 See Comment (A)   ??? hsTroponin I 11/11/2020 23    ??? delta hsTroponin I 11/11/2020 3    ??? EKG Ventricular Rate 11/11/2020 96    ??? EKG Atrial Rate 11/11/2020 256    ??? EKG  QRS Duration 11/11/2020 148    ??? EKG Q-T Interval 11/11/2020 404    ??? EKG QTC Calculation 11/11/2020 510 ??? EKG Calculated R Axis 11/11/2020 35    ??? EKG Calculated T Axis 11/11/2020 50    ??? QTC Fredericia 11/11/2020 472    ??? hsTroponin I 11/12/2020 23    ??? delta hsTroponin I 11/12/2020 0    ??? EKG Ventricular Rate 11/11/2020 90    ??? EKG Atrial Rate 11/11/2020 90    ??? EKG QRS Duration 11/11/2020 150    ??? EKG Q-T Interval 11/11/2020 432    ??? EKG QTC Calculation 11/11/2020 528    ??? EKG Calculated R Axis 11/11/2020 16    ??? EKG Calculated T Axis 11/11/2020 70    ??? QTC Fredericia 11/11/2020 494    ??? SARS-CoV-2 PCR 11/12/2020 Negative    ??? Influenza A 11/12/2020 Negative    ??? Influenza B 11/12/2020 Negative    ??? RSV 11/12/2020 Negative    ??? VRE Screen 11/12/2020 NOT DETECTED    ??? Hemoglobin A1C 11/11/2020 >14.0 (A)   ??? Sodium 11/12/2020 138    ??? Potassium 11/12/2020 5.7 (A)   ??? Chloride 11/12/2020 113 (A)   ??? CO2 11/12/2020 16.0 (A)   ??? Anion Gap 11/12/2020 9    ??? BUN 11/12/2020 36 (A)   ??? Creatinine 11/12/2020 2.17 (A)   ??? BUN/Creatinine Ratio 11/12/2020 17    ??? eGFR CKD-EPI (2021) Male 11/12/2020 36 (A)   ??? Glucose 11/12/2020 283 (A)   ??? Calcium 11/12/2020 8.9    ??? WBC 11/12/2020 13.6 (A)   ??? RBC 11/12/2020 3.83 (A)   ??? HGB 11/12/2020 10.5 (A)   ??? HCT 11/12/2020 32.4 (A)   ??? MCV 11/12/2020 84.5    ??? [facility] 11/12/2020 27.5    ??? MCHC 11/12/2020 32.5    ??? RDW 11/12/2020 17.4 (A)   ??? MPV 11/12/2020 10.5    ??? Platelet 11/12/2020 222    ??? PT 11/12/2020 13.3    ??? INR 11/12/2020 1.14    ??? Glucose, POC 11/12/2020 236 (A)   ??? Glucose, POC 11/12/2020 240 (A)   ??? Glucose, POC 11/12/2020 172    ??? Collection 11/11/2020 Collected    ??? Case Report 11/11/2020                      Value:Molecular Genetics Report                         Case: [id number]                                 Authorizing Provider:  Guerry Bruin, MD Collected:           11/11/2020 1140              Ordering Location:     [facility]                 Received:            11/20/2020 1229              Pathologist:           Prudencio Burly, MD Specimen:    RNA, [id number]                                                                          ???  BCR-ABL1 Mutation Analys* 11/11/2020                      Value:This result contains rich text formatting which cannot be displayed here.   Lab on 11/11/2020   Component Date Value   ??? Collection 11/11/2020 Collected    ??? Sodium 11/11/2020 136    ??? Potassium 11/11/2020 5.6 (A)   ??? Chloride 11/11/2020 111 (A)   ??? CO2 11/11/2020 18.0 (A)   ??? Anion Gap 11/11/2020 7    ??? BUN 11/11/2020 39 (A)   ??? Creatinine 11/11/2020 2.46 (A)   ??? BUN/Creatinine Ratio 11/11/2020 16    ??? eGFR CKD-EPI (2021) Male 11/11/2020 31 (A)   ??? Glucose 11/11/2020 238 (A)   ??? Calcium 11/11/2020 9.3    ??? Albumin 11/11/2020 3.5    ??? Total Protein 11/11/2020 7.1    ??? Total Bilirubin 11/11/2020 0.6    ??? AST 11/11/2020 25    ??? ALT 11/11/2020 20    ??? Alkaline Phosphatase 11/11/2020 391 (A)   ??? Magnesium 11/11/2020 1.8    ??? LDH 11/11/2020 480 (A)   ??? Uric Acid 11/11/2020 10.0 (A)   ??? Phosphorus 11/11/2020 2.7    ??? WBC 11/11/2020 17.2 (A)   ??? RBC 11/11/2020 3.63 (A)   ??? HGB 11/11/2020 10.0 (A)   ??? HCT 11/11/2020 31.2 (A)   ??? MCV 11/11/2020 85.9    ??? MCH 11/11/2020 27.6    ??? MCHC 11/11/2020 32.1    ??? RDW 11/11/2020 17.3 (A)   ??? MPV 11/11/2020 10.9 (A)   ??? Platelet 11/11/2020 243    ??? Neutrophils % 11/11/2020 75.2    ??? Lymphocytes % 11/11/2020 11.5    ??? Monocytes % 11/11/2020 9.8    ??? Eosinophils % 11/11/2020 3.3    ??? Basophils % 11/11/2020 0.2    ??? Absolute Neutrophils 11/11/2020 13.0 (A)   ??? Absolute Lymphocytes 11/11/2020 2.0    ??? Absolute Monocytes 11/11/2020 1.7 (A)   ??? Absolute Eosinophils 11/11/2020 0.6 (A)   ??? Absolute Basophils 11/11/2020 0.0    ??? Anisocytosis 11/11/2020 Slight (A)   ??? Case Report 11/11/2020                      Value:Molecular Genetics Report                         Case: [id number]                                 Authorizing Provider:  Guerry Bruin, MD Collected: 11/11/2020 1140              Ordering Location:     [facility]    Received:            11/11/2020 1149                                     [facility]                                                     Pathologist:  Prudencio Burly, MD                                                      Specimen:    Blood                                                                                     ??? Specimen Type 11/11/2020                      Value:Blood   ??? BCR/ABL1 p210 Assay 11/11/2020 Positive    ??? BCR/ABL1 p210 IS% Ratio 11/11/2020 74.438    ??? BCR/ABL1 p210 Assay Resu* 11/11/2020                      Value:This result contains rich text formatting which cannot be displayed here.   ??? Smear Review Comments 11/11/2020 See Comment (A)   ??? Giant Platelets 11/11/2020 Present (A)   ??? Toxic Vacuolation 11/11/2020 Present (A)     Lab Results   Component Value Date    NA 139 01/04/2021    K 4.6 01/04/2021    CL 107 01/04/2021    CO2 23.0 01/04/2021    BUN 50 (H) 01/04/2021    CREATININE 2.23 (H) 01/04/2021    AST 37 (H) 01/04/2021    PROT 7.7 01/04/2021    ALBUMIN 3.8 01/04/2021       DATA REVIEW:  Labs: I personally reviewed lab work at this visit.   Other medical data: I personally reviewed and summarized the records in preparation for today's visit, all pertinent notes in Epic/Media and CareEverywhere as well as any sent records.

## 2021-02-26 MED ORDER — HYDROXYUREA 500 MG CAPSULE
ORAL_CAPSULE | 0 refills | 0 days
Start: 2021-02-26 — End: ?

## 2021-03-01 NOTE — Unmapped (Signed)
[facility] Refill Coordination Note    Specialty Medication(s) to be Shipped:   Hematology/Oncology: Scemblix    Other medication(s) to be shipped: No additional medications requested for fill at this time     Colin Hunter, DOB: 1970-07-18  Phone: [phone] (home)       All above HIPAA information was verified with patient.     Was a Nurse, learning disability used for this call? No    Completed refill call assessment today to schedule patient's medication shipment from the [facility] [phone]).  All relevant notes have been reviewed.     Specialty medication(s) and dose(s) confirmed: Regimen is correct and unchanged.   Changes to medications: Colin Hunter reports no changes at this time.  Changes to insurance: No  New side effects reported not previously addressed with a pharmacist or physician: None reported  Questions for the pharmacist: No    Confirmed patient received a Conservation officer, historic buildings and a Surveyor, mining with first shipment. The patient will receive a drug information handout for each medication shipped and additional FDA Medication Guides as required.       DISEASE/MEDICATION-SPECIFIC INFORMATION        N/A    SPECIALTY MEDICATION ADHERENCE     Medication Adherence    Patient reported X missed doses in the last month: 0  Specialty Medication: Scemblix 40mg   Patient is on additional specialty medications: No  Informant: patient              Were doses missed due to medication being on hold? No    Scemblix 40 mg: 6 days of medicine on hand       REFERRAL TO PHARMACIST     Referral to the pharmacist: Not needed      [facility]     Shipping address confirmed in Epic.     Delivery Scheduled: Yes, Expected medication delivery date: 03/03/21.     Medication will be delivered via UPS to the prescription address in Epic [place].    Colin Hunter   [facility] Specialty Technician

## 2021-03-02 MED FILL — SCEMBLIX 40 MG TABLET: ORAL | 30 days supply | Qty: 60 | Fill #3

## 2021-03-15 DIAGNOSIS — C9210 Chronic myeloid leukemia, BCR/ABL-positive, not having achieved remission: Principal | ICD-10-CM

## 2021-03-16 DIAGNOSIS — C9210 Chronic myeloid leukemia, BCR/ABL-positive, not having achieved remission: Principal | ICD-10-CM

## 2021-03-17 NOTE — Unmapped (Unsigned)
[facility] Follow-up    Patient Name: Colin Hunter  Patient Age: 50 y.o.  Encounter Date: 03/17/2021    Primary Care Provider:  Devota Pace, MD    Referring Physician:  Irene Limbo, MD  [address] [place],  [place] [address]    Reason for visit: CML follow up, assessment of treatment response     Assessment:  Colin Hunter is a 50 y.o. year old male with history of CML with congenital heart disease, currently on treatment with Asciminib 40 mg bid since 11/2020 and previous patient of Dr. Malen Gauze. In brief, was initially diagnosed w/CML in 06/2015 and took on Imatinib 400mg  until 09/2019, when due to intolerance and disease resistance, he started Bosutinib 400 mg daily. Given cardiac and metabolic toxicities, he was ineligible for nilotinib, ponatinib and dasatinib. No molecular response. Given no ABL kinase point mutation, he was started on Asciminib 40 mg bid on 12/09/20. Presents today to assess treatment response with BCR/ABL1 PCR assay.     Today,     Plan and Recommendations:  1. Continue Asciminib 40 mg bid.     History of Present Illness:  Oncology History Overview Note   CML, chronic phase:   Leukocytosis to ~30 at time of diagnosis with normal platelet count and Hgb.     Bone marrow biopsy 06/26/2015:   CML, chronic phase (2% blasts, 2% promyelocytes)    Aspirate Smear:   The aspirate smear is hypercellular with rare small spicules.   Myeloid elements are increased with full maturation, without significant   cytologic atypia. Erythroid elements are markedly decreased, without significant cytologic atypia. The myeloid to erythroid ratio is >10:1. Many small hypolobated megakaryocytes are present. Storage iron is present; ringed sideroblasts are not identified.     A differential count of 200 nucleated cells shows the following   percentages:   2 Blasts   2 Promyelocytes   31 Myelocytes/Metamyelocytes   51 Bands/Neutrophils   3 Monocytes   3 Eosinophils   1 Basophils   1 Lymphocytes   0 Plasma cells   6 NRBCs     Biopsy:   H&E and PAS stained sections show unremarkable trabecular bone and markedly hypercellular marrow for age (95% cellularity). The myeloid to erythroid ratio is >5:1. Erythroid elements are decreased with full maturation. Myeloid elements are markedly increased with full maturation.   Megakaryocytes are increased with many small hypolobated forms.   Scattered small lymphocytes and plasma cells account for <5% of   cellularity. A reticulin stain shows grade 1 of 3 fibrosis (European Consensus System).     Flow cytometry:   Flow cytometry performed on the bone marrow aspirate ([id number])   demonstrates a non-discrete population of blasts and maturing   myelomonocytic cells within the blast gate (<2% of total events).     CBC:   A tandem CBC shows: WBC 27.9 (55.7% neutrophils, 0.9% bands, 0.9%   metamyelocytes, 11.3% myelocytes, 10.4% lymphocytes, 13.2% monocytes, 1.0%   eosinophils, 4.7% basophils), Hgb 13.4, MCV 87, Plt 224.     Treatment:   Imatinib, 400mg , 06/26/2015-05/17/2019  Bosutiib, 400 mg 10/04/2019 -     BCR/ABL testing:   06/2015: 87.086%  07/17/2015: 140.922%  08/18/2015: 75.693%  12/08/2015: 2.144%  01/07/2016: 2.450%  03/29/2016: 2.008%  07/26/2016: 2.871%; Kinase domain mutation testing negative  10/28/2016: 1.247%  01/05/2017: 0.641%  03/14/2017: 0.348%; Kinase domain mutation testing negative  06/25/19: Result not available   07/26/19: Result not available  07/26/19 FISH: positive t(9;22) in ~90.5% of cells     Final Diagnosis   Date Value Ref Range Status   08/20/2019   Final    Bone marrow, right iliac, aspiration and biopsy  -  Hypercellular bone marrow (95%) involved by chronic myeloid leukemia, BCR-ABL1-positive, chronic phase (2% blasts by manual aspirate differential)  -  See linked reports for associated Ancillary Studies.      This electronic signature is attestation that the pathologist personally reviewed the submitted material(s) and the final diagnosis reflects that evaluation.         08/12/19 BCR/ABL: 28.427 %  08/20/19 BCR/ABL: 35.897%  08/12/19 TKI Resistance Testing: Negative  10/04/19: started bosutinib 400 mg daily  07/03/20: BCR/ABL 68.2%, TKI resistance testing negative  11/11/20: BCR/ABL 74.4%, TKI resistance testing negative  12/09/20: started Asciminib 40 mg bid      Current treatment:   Asciminib 40 mg bid     Chronic myeloid leukemia (CMS-HCC)   06/26/2015 Initial Diagnosis    Chronic myeloid leukemia (CMS-HCC)     06/26/2015 - 05/17/2019 Chemotherapy    Imatinib, 400mg  daily; discontinued due to worsening edema     10/04/2019 - 11/2020 Chemotherapy    Bosutinib 400mg      11/11/2020 Progression    Loss of hematologic response with neutrophilia, monocytosis, eosinophilia but not basophilia. Possibly due to non-adherence and association of LE edema with bosutinib as potential toxicity--lack of molecular response but no BCR-ABL point mutations     12/10/2020 -  Chemotherapy    Asciminib 40 mg PO BID     01/04/2021 Remission    Complete hematologic response         Interval History:  Since last seen here,     Otherwise, he denies new constitutional symptoms such as anorexia, weight loss, night sweats or unexplained fevers.  Furthermore, he denies symptoms of marrow failure: unexplained bleeding or bruising, recurrent or unexplained intercurrent infections, dyspnea on exertion, lightheadedness, palpitations or chest pain.  There have been no new or unexplained pains or self-identified masses, swelling or enlarged lymph nodes.    Past Medical, Surgical and Family History were reviewed and pertinent updates were made in the Electronic Medical Record    Review of Systems:  Other than as reported above in the interim history, the balance of a full 12-system review was performed and unremarkable.    ECOG Performance Status: ***    Past Medical History:  Past Medical History:   Diagnosis Date   ??? CKD (chronic kidney disease) stage 3, GFR 30-59 ml/min (CMS-HCC)    ??? CML (chronic myelocytic leukemia) (CMS-HCC)    ??? HFrEF (heart failure with reduced ejection fraction) (CMS-HCC)    ??? Paroxysmal atrial fibrillation (CMS-HCC)    ??? PFO (patent foramen ovale)    ??? Right atrial thrombus    ??? T2DM (type 2 diabetes mellitus) (CMS-HCC)        Medications:    Current Outpatient Medications   Medication Sig Dispense Refill   ??? apixaban (ELIQUIS) 5 mg Tab Take 1 tablet (5 mg total) by mouth Two (2) times a day. 60 tablet 12   ??? asciminib (SCEMBLIX) 40 mg tablet Take 1 tablet (40 mg total) by mouth Two (2) times a day. Take on an empty stomach, at least 1 hour before or 2 hours after a meal. Swallow tablets whole. Do not break, crush, or chew the tablets. 60 tablet 11   ??? BINAXNOW COVID-19 AG SELF TEST Kit      ???  blood sugar diagnostic Strp      ??? blood-glucose meter Misc      ??? folic acid (FOLVITE) 1 MG tablet Take 1 tablet (1 mg total) by mouth in the morning. 90 tablet 3   ??? HUMULIN R REGULAR U-100 INSULN 100 unit/mL injection INJECT 15 UNITS SUBCUTANEOUSLY THREE TIMES DAILY BEFORE MEAL(S)     ??? insulin glargine (BASAGLAR, LANTUS) 100 unit/mL (3 mL) injection pen Inject 0.27 mL (27 Units total) under the skin nightly. 15 mL 12   ??? lancets Misc by Miscellaneous route.     ??? metoprolol succinate (TOPROL-XL) 100 MG 24 hr tablet Take 1 tablet (100 mg total) by mouth daily. 30 tablet 1   ??? ONETOUCH VERIO TEST STRIPS Strp USE 1 STRIP TO CHECK GLUCOSE TWICE DAILY     ??? OZEMPIC 0.25 mg or 0.5 mg(2 mg/1.5 mL) PnIj injection Inject 0.5 mg under the skin every seven (7) days.     ??? pantoprazole (PROTONIX) 40 MG tablet Take 40 mg by mouth daily.     ??? pen needle, diabetic 29 gauge x 1/2 (12 mm) Ndle Please substitute for cheapest generic available. Diagnosis E11.9.Z79.4 110 each 0   ??? pregabalin (LYRICA) 100 MG capsule Take 100 mg by mouth Three (3) times a day.     ??? rosuvastatin (CRESTOR) 20 MG tablet Take 1 tablet (20 mg total) by mouth daily. 90 tablet 0   ??? simethicone (MYLICON) 80 MG chewable tablet Chew 1 tablet (80 mg total) every six (6) hours as needed for flatulence. 60 tablet 2   ??? torsemide (DEMADEX) 20 MG tablet Take by mouth two (2) times a day. 40 mg in the morning and 20 mg in the evening       No current facility-administered medications for this visit.       Vital Signs:  BSA: There is no height or weight on file to calculate BSA.  There were no vitals filed for this visit.      Physical Exam:  Objective:   Physical Exam: General: Resting in no apparent distress  HEENT:  Pupils are equal, round and reactive to light and accomodation.  There is no scleral icterus and no conjunctival injection.  The oral mucosa does not demonstrate ulceration, erythema, exudate or purpura.    Lymph node exam:  No lymphadenopathy in the occipital, auricular, anterior/posterior cervical regions.  Heart:  Regular rate and rhythm.  Normal S1 and S2 without S3 or S4.  There are no murmurs, gallops or rubs.  Lungs:  Breathing is unlabored and patient is speaking full sentences with ease.  No stridor.  Auscultation of lung fields reveals normal air movement without rales, ronchi or crackles.    Abdomen:  No distention or pain on palpation. There is no palpable hepatomegaly or splenomegaly.  No palpable masses.  Skin:  No rashes, petechiae or purpura.  No areas of skin breakdown.  Musculoskeletal:  There are no grossly-evident joint effusions or deformities.  Range of motion about the shoulder, elbow, hips and knees is grossly normal.    Psychiatric:  Alert and oriented to person, place, time and situation.  Range of affect is appropriate.    Neurologic:   Strength 5/5 in upper and lower extremities. Gait is normal.   Extremities:  Appear well-perfused, there is no clubbing, edema or cyanosis.      Relevant Laboratory, radiology and pathology results:  No results found for this or any previous visit (from the past 24 hour(s)).

## 2021-03-18 NOTE — Unmapped (Signed)
[facility] Care Navigation Pre-Consult Assessment:    Summary: Patient is a 38 Y male living in [place] with his wife and granddaughter. He has been previously seeing Dr. Malen Gauze at [facility] for co-management of [facility]. No financial, transportation, housing or food insecurity. Patient endorses his primary social support is his wife, but no coping concerns at this time. He ambulates with a caneHe is on disability and works part-time as a Civil Service fast streamer. He plans to enroll in MyChart before his visit.    Risk factors: Distance from [facility] > or = 1 hour, 2 or more co-morbidities  and Black/African American    Completed by: Patient  Reason for referral: Other  Patient's perceived reason for upcoming appointment: Establish care for [facility], previously Dr. Malen Gauze patient    General Health  PCP: Devota Pace, MD  Date last PCP visit: 12/11/20  Have you had a dental or oral exam in the last year?: (!) No  How would you describe you or your child's oral health?: Fair  Additional Health Concerns: CKD, DM, heart failure, paroxysmal A. Fib  More than 3 medications taken daily: Yes  Overall physical health rating: Good  Mental health conditions impacting care: None documented  Overall mental health: Very Good  Difficulty remembering tasks: No  Any family have concerns about memory?: No  Neuropsychological problems: No psychological problems  Hospital discharge within the last 30 days: No  Have you fallen in the last year?: (!) Yes (last fall 01/2021)  Do you feel unsteady when standing or walking?:No  COVID Vaccination status Moderna X 4    Functional Assessment  Appetite/nutrition concerns: No decrease in food intake  Weight loss within past 3 months: No weight loss  BMI: (!) 33.11    Social  Patient lives with: spouse  Positive support system: spouse or partner  Type of residence: private residence  DME at home: cane  Food Insecurity: No Food Insecurity   Mode of transportation: Garment/textile technologist:  Delivery driver Part Time  Hobbies/Interest:  fishing, swimming    Emotional/Coping  Healthy coping mechanisms: Hobbies, Exercise, Spending time with family/friends  Caregiver concerns: Denies  Important information for medical team to know: N/A    Financial  The navigator spoke with the patient and they reported they have: Medicare + Medicaid  How hard is it for you to pay for the very basics like food, housing, medical care, and heating?: Not hard at all     Supportive Communication  What does your internet access look like (SmartPhone, None, etc.): I pay for a broadband internet connection, Through my smartphone / mobile data plan  What devices do you use for your internet access?: A mobile phone or smartphone (like iPhone or Android phone)  [place] MyChart: Has access code but has not enrolled  Would you benefit from additional assistance in using My [facility] Chart?: No  Preferred method of communication: Phone  Availability: No preference  Learning Style: Listening, Demonstration  Form Literacy: How often do you need help when you read instructions, pamphlets, or other written material from your doctor or pharmacy?: Rarely    Administrative Tasks  Clinical research eligibility: Did not discuss during call  Patient live over 50 miles from location of consult? Yes    G8 Score: 15    Social Determinants of Health     Food Insecurity: No Food Insecurity   ??? Worried About Programme researcher, broadcasting/film/video in the Last Year: Never true   ??? Ran  Out of Food in the Last Year: Never true   Tobacco Use: Medium Risk   ??? Smoking Tobacco Use: Former   ??? Smokeless Tobacco Use: Never   ??? Passive Exposure: Not on file   Transportation Needs: No Transportation Needs   ??? Lack of Transportation (Medical): No   ??? Lack of Transportation (Non-Medical): No   Alcohol Use: Not At Risk   ??? How often do you have a drink containing alcohol?: Never   ??? How many drinks containing alcohol do you have on a typical day when you are drinking?: 1 - 2   ??? How often do you have 5 or more drinks on one occasion?: Never   Housing/Utilities: Low Risk    ??? Within the past 12 months, have you ever stayed: outside, in a car, in a tent, in an overnight shelter, or temporarily in someone else's home (i.e. couch-surfing)?: No   ??? Are you worried about losing your housing?: No   ??? Within the past 12 months, have you been unable to get utilities (heat, electricity) when it was really needed?: No   Substance Use: Not on file   Financial Resource Strain: Low Risk    ??? Difficulty of Paying Living Expenses: Not hard at all   Physical Activity: Not on file   Health Literacy: Medium Risk   ??? : Rarely   Stress: Not on file   Intimate Partner Violence: Not on file   Depression: Not at risk   ??? PHQ-2 Score: 0   Social Connections: Not on file     Consult Visit Preparation:  Nurse Navigator (NN) oriented patient to [facility] and team member roles.   NN provided detailed review of expectations for upcoming appointment including general education regarding: disease related questions, appt flow, hospital environment, care team, clinic research intro, and appropriate points of contact. NN encouraged patient to return to PCP or referring physician if healthcare needs arise prior to consult.    Navigation Assessment Score (NAS): 3    The NAS is a composite score of 14 weighted variables that may help predict a patient???s access to treatment or ability to continue treatment. A high NAS indicates the patient may require a higher level of navigation support and/or interventions. (Low = 0-1, Med = 2-4, High = 5-19).     Navigation Care Plan:  Patient verbalized understanding of information provided and expressed appreciation for proactive support. Encouraged patient to bring COVID vaccination card to upcoming visit so dates of vaccines can be added to his health record. If the patient pursues treatment, the Navigation team will initiate a barrier assessment and formalize a navigation plan as appropriate.

## 2021-03-19 NOTE — Unmapped (Signed)
[facility] Pharm[facility] Refill Coordination Note    Specialty Medication(s) to be Shipped:   Hematology/Oncology: Scemblix    Other medication(s) to be shipped: No additional medications requested for fill at this time     Colin Hunter, DOB: 11-25-1970  Phone: [phone] (home)       All above HIPAA information was verified with patient.     Was a Nurse, learning disability used for this call? No    Completed refill call assessment today to schedule patient's medication shipment from the [facility] [phone]).  All relevant notes have been reviewed.     Specialty medication(s) and dose(s) confirmed: Regimen is correct and unchanged.   Changes to medications: Colin Hunter reports no changes at this time.  Changes to insurance: No  New side effects reported not previously addressed with a pharmacist or physician: None reported  Questions for the pharmacist: No    Confirmed patient received a Conservation officer, historic buildings and a Surveyor, mining with first shipment. The patient will receive a drug information handout for each medication shipped and additional FDA Medication Guides as required.       DISEASE/MEDICATION-SPECIFIC INFORMATION        N/A    SPECIALTY MEDICATION ADHERENCE     Medication Adherence    Patient reported X missed doses in the last month: 0  Specialty Medication: Scemblix 40mg   Patient is on additional specialty medications: No  Informant: patient              Were doses missed due to medication being on hold? No    Scemblix 40 mg: 14 days of medicine on hand       REFERRAL TO PHARMACIST     Referral to the pharmacist: Not needed      [facility]     Shipping address confirmed in Epic.     Delivery Scheduled: Yes, Expected medication delivery date: 04/02/21.     Medication will be delivered via UPS to the prescription address in Epic [place].    [name] M [name]   [facility] Specialty Technician

## 2021-04-01 MED FILL — SCEMBLIX 40 MG TABLET: ORAL | 30 days supply | Qty: 60 | Fill #4

## 2021-04-20 DIAGNOSIS — C9210 Chronic myeloid leukemia, BCR/ABL-positive, not having achieved remission: Principal | ICD-10-CM

## 2021-04-20 NOTE — Unmapped (Signed)
I called [name] after noticing that he saw a local doctor on 04/19/21 for a persistent cough after COVID 19.    [name] said that he is aware of the appointment but does not feel well.  He asked to reschedule which is prudent given his continued cough.    I noticed that his Asciminib is not listed on any of his [facility] medical records during his December hospitalization for COVID 19 or on his recent doctors visit.    Mr Colin Hunter assured me he takes it every day except for 5 days missed while he was hospitalized.    I asked him to assure that his local doctors know he takes this since they continue to prescribe other medications.    I told [name] that we will call and reschedule.

## 2021-04-21 NOTE — Unmapped (Signed)
Done

## 2021-04-21 NOTE — Unmapped (Signed)
Called pt to r/s his appt with zeidner, due to covid. LM

## 2021-04-21 NOTE — Unmapped (Signed)
Called and LM for pt re appt for 2/16

## 2021-04-28 NOTE — Unmapped (Signed)
[facility] Refill Coordination Note    Specialty Medication(s) to be Shipped:   Hematology/Oncology: Scemblix    Other medication(s) to be shipped: No additional medications requested for fill at this time     Colin Hunter, DOB: 28-Nov-1970  Phone: [phone] (home)       All above HIPAA information was verified with patient.     Was a Nurse, learning disability used for this call? No    Completed refill call assessment today to schedule patient's medication shipment from the [facility] [phone]).  All relevant notes have been reviewed.     Specialty medication(s) and dose(s) confirmed: Regimen is correct and unchanged.   Changes to medications: Colin Hunter reports no changes at this time.  Changes to insurance: No  New side effects reported not previously addressed with a pharmacist or physician: None reported  Questions for the pharmacist: No    Confirmed patient received a Conservation officer, historic buildings and a Surveyor, mining with first shipment. The patient will receive a drug information handout for each medication shipped and additional FDA Medication Guides as required.       DISEASE/MEDICATION-SPECIFIC INFORMATION        N/A    SPECIALTY MEDICATION ADHERENCE     Medication Adherence    Patient reported X missed doses in the last month: 9  Specialty Medication: Scemblix 40mg   Patient is on additional specialty medications: No              Were doses missed due to medication being on hold? No    Scemblix 40 mg: 10 days of medicine on hand        REFERRAL TO PHARMACIST     Referral to the pharmacist: Yes - high priority compliance concerns. Patient has missed more than 3 doses of medication. Refills were scheduled and concern routed (high priority) to pharmacist for evaluation.      SHIPPING     Shipping address confirmed in Epic.     Delivery Scheduled: Yes, Expected medication delivery date: 05/05/21.     Medication will be delivered via UPS to the prescription address in Epic [place].    Unk Lightning   [facility] Specialty Technician

## 2021-04-30 ENCOUNTER — Ambulatory Visit: Admit: 2021-04-30 | Payer: MEDICARE

## 2021-05-04 MED FILL — SCEMBLIX 40 MG TABLET: ORAL | 30 days supply | Qty: 60 | Fill #5

## 2021-05-05 NOTE — Unmapped (Signed)
Chart review only to determine FIT outreach eligibility.

## 2021-05-26 DIAGNOSIS — C9210 Chronic myeloid leukemia, BCR/ABL-positive, not having achieved remission: Principal | ICD-10-CM

## 2021-05-26 NOTE — Unmapped (Unsigned)
[facility]emia [facility] Follow-up    Patient Name: Colin Hunter  Patient Age: 51 y.o.  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 Given the cardiac and metabolic toxicities of nilotinib, ponatinib and dasatinib, bosutinib was tried as a 2nd line agent, which did not result in molecular response, though drug adherence may have been an issue early in his course. He has been on asciminib 40 mg BID since 12/07/2020 and has had normalization*** of neutrophilia, monocytosis and eosinophilia (all modest at baseline) consistent with complete hematologic response. He has declining platelet count which may be treatment related***, and given his anticoagulation this will need to be monitored. He is currently taking apixiban 5mg  daily. He presents today for follow up.    Colin Hunter is doing *** today. He is tolerating asciminib well, but never returned for his 3 month BCR-ABL in 03/2021. I reiterated ***  His BCR-ABL is pending. He will RTC in *** months for a clinic visit and BCR-ABL with monthly BCR-ABLs in the interim.    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 Derinda Sis at [facility].  If he fails asciminib, consultation with cardio-oncology would be reasonable before considering alternate agents that have known cardiac toxicity.  -- Right atrial thrombus: Emerged despite anticoagulation with Eliquis. Since transitioned to Warfarin (as of 05/2019) though has had issues with supratherapeutic INR levels. We will need to monitor for even modest thrombocytopenia as he starts asciminib  -- Hx Syncope/Pre-syncope: perhaps improved risk after ablation for atrial fibrillation.    CKD Stage 3: Baseline creatinine ~2. Likely secondary to diabetic and hypertensive nephropathy.    Covid ppx: 4 covid vaccines.    Plan and Recommendations***  - Continue asciminib 40 mg PO BID  - Monthly BCR-ABL testing  - RTC in *** months for clinic visit and BCR-ABL test  _______________________________________________________________    Documentation assistance was provided by Roosevelt Locks, Scribe, on May 27, 2021 at 8:30 AM for Jimmye Norman, MD.     {*** NOTE TO PROVIDER: PLEASE ADD ATTESTATION NOTING YOU AGREE WITH SCRIBE DOCUMENTATION}    History of Present Illness:  Oncology History Overview Note   CML, chronic phase:   Leukocytosis to ~30 at time of diagnosis with normal platelet count and Hgb.     Bone marrow biopsy 06/26/2015:   CML, chronic phase (2% blasts, 2% promyelocytes)    Aspirate Smear:   The aspirate smear is hypercellular with rare small spicules.   Myeloid elements are increased with full maturation, without significant   cytologic atypia. Erythroid elements are markedly decreased, without significant cytologic atypia. The myeloid to erythroid ratio is >10:1. Many small hypolobated megakaryocytes are present. Storage iron is present; ringed sideroblasts are not identified.     A differential count of 200 nucleated cells shows the following   percentages:   2 Blasts   2 Promyelocytes   31 Myelocytes/Metamyelocytes   51 Bands/Neutrophils   3 Monocytes   3 Eosinophils  1 Basophils   1 Lymphocytes   0 Plasma cells   6 NRBCs     Biopsy:   H&E and PAS stained sections show unremarkable trabecular bone and markedly hypercellular marrow for age (95% cellularity). The myeloid to erythroid ratio is >5:1. Erythroid elements are decreased with full maturation. Myeloid elements are markedly increased with full maturation.   Megakaryocytes are increased with many small hypolobated forms.   Scattered small lymphocytes and plasma cells account for <5% of   cellularity. A reticulin stain shows grade 1 of 3 fibrosis (European Consensus System).     Flow cytometry:   Flow cytometry performed on the bone marrow aspirate ([id number])   demonstrates a non-discrete population of blasts and maturing   myelomonocytic cells within the blast gate (<2% of total events).     CBC:   A tandem CBC shows: WBC 27.9 (55.7% neutrophils, 0.9% bands, 0.9%   metamyelocytes, 11.3% myelocytes, 10.4% lymphocytes, 13.2% monocytes, 1.0%   eosinophils, 4.7% basophils), Hgb 13.4, MCV 87, Plt 224.     Treatment:   Imatinib, 400mg , 06/26/2015-05/17/2019  Bosutiib, 400 mg 10/04/2019 -     BCR/ABL testing:   06/2015: 87.086%  07/17/2015: 140.922%  08/18/2015: 75.693%  12/08/2015: 2.144%  01/07/2016: 2.450%  03/29/2016: 2.008%  07/26/2016: 2.871%; Kinase domain mutation testing negative  10/28/2016: 1.247%  01/05/2017: 0.641%  03/14/2017: 0.348%; Kinase domain mutation testing negative  06/25/19: Result not available   07/26/19: Result not available      07/26/19 FISH: positive t(9;22) in ~90.5% of cells     Final Diagnosis   Date Value Ref Range Status   08/20/2019   Final    Bone marrow, right iliac, aspiration and biopsy  -  Hypercellular bone marrow (95%) involved by chronic myeloid leukemia, BCR-ABL1-positive, chronic phase (2% blasts by manual aspirate differential)  -  See linked reports for associated Ancillary Studies.      This electronic signature is attestation that the pathologist personally reviewed the submitted material(s) and the final diagnosis reflects that evaluation.         08/12/19 BCR/ABL: 28.427 %  08/20/19 BCR/ABL: 35.897%  08/12/19 TKI Resistance Testing: Negative  10/04/19: started bosutinib 400 mg daily  07/03/20: BCR/ABL 68.2%, TKI resistance testing negative  11/11/20: BCR/ABL 74.4%, TKI resistance testing negative  12/09/20: started Asciminib 40 mg bid      Current treatment:   Asciminib 40 mg bid    05/27/21 BCR-ABL: pending     Chronic myeloid leukemia (CMS-HCC)   06/26/2015 Initial Diagnosis    Chronic myeloid leukemia (CMS-HCC)     06/26/2015 - 05/17/2019 Chemotherapy    Imatinib, 400mg  daily; discontinued due to worsening edema     10/04/2019 - 11/2020 Chemotherapy    Bosutinib 400mg      11/11/2020 Progression    Loss of hematologic response with neutrophilia, monocytosis, eosinophilia but not basophilia. Possibly due to non-adherence and association of LE edema with bosutinib as potential toxicity--lack of molecular response but no BCR-ABL point mutations     12/10/2020 -  Chemotherapy    Asciminib 40 mg PO BID     01/04/2021 Remission    Complete hematologic response       Interval History:  Colin Hunter presents today accompanied by *** and is doing *** since last seen on 01/04/21.    Otherwise, he denies new constitutional symptoms such as anorexia, weight loss, night sweats or unexplained fevers.  Furthermore, he denies symptoms of marrow failure: unexplained bleeding or bruising, recurrent or unexplained  intercurrent infections, dyspnea on exertion, lightheadedness, palpitations or chest pain.  There have been no new or unexplained pains or self-identified masses, swelling or enlarged lymph nodes.    Past Medical, Surgical and Family History were reviewed and pertinent updates were made in the Electronic Medical Record    Review of Systems:  Other than as reported above in the interim history, the balance of a full 12-system review was performed and unremarkable.    ECOG Performance Status: 1    Medications:    Current Outpatient Medications   Medication Sig Dispense Refill   ??? apixaban (ELIQUIS) 5 mg Tab Take 1 tablet (5 mg total) by mouth Two (2) times a day. 60 tablet 12   ??? asciminib (SCEMBLIX) 40 mg tablet Take 1 tablet (40 mg total) by mouth Two (2) times a day. Take on an empty stomach, at least 1 hour before or 2 hours after a meal. Swallow tablets whole. Do not break, crush, or chew the tablets. 60 tablet 11   ??? BINAXNOW COVID-19 AG SELF TEST Kit      ??? blood sugar diagnostic Strp      ??? blood-glucose meter Misc      ??? folic acid (FOLVITE) 1 MG tablet Take 1 tablet (1 mg total) by mouth in the morning. 90 tablet 3   ??? HUMULIN R REGULAR U-100 INSULN 100 unit/mL injection INJECT 15 UNITS SUBCUTANEOUSLY THREE TIMES DAILY BEFORE MEAL(S)     ??? insulin glargine (BASAGLAR, LANTUS) 100 unit/mL (3 mL) injection pen Inject 0.27 mL (27 Units total) under the skin nightly. 15 mL 12   ??? lancets Misc by Miscellaneous route.     ??? metoprolol succinate (TOPROL-XL) 100 MG 24 hr tablet Take 1 tablet (100 mg total) by mouth daily. 30 tablet 1   ??? ONETOUCH VERIO TEST STRIPS Strp USE 1 STRIP TO CHECK GLUCOSE TWICE DAILY     ??? OZEMPIC 0.25 mg or 0.5 mg(2 mg/1.5 mL) PnIj injection Inject 0.5 mg under the skin every seven (7) days.     ??? pantoprazole (PROTONIX) 40 MG tablet Take 40 mg by mouth daily.     ??? pen needle, diabetic 29 gauge x 1/2 (12 mm) Ndle Please substitute for cheapest generic available. Diagnosis E11.9.Z79.4 110 each 0   ??? pregabalin (LYRICA) 100 MG capsule Take 100 mg by mouth Three (3) times a day.     ??? rosuvastatin (CRESTOR) 20 MG tablet Take 1 tablet (20 mg total) by mouth daily. 90 tablet 0   ??? simethicone (MYLICON) 80 MG chewable tablet Chew 1 tablet (80 mg total) every six (6) hours as needed for flatulence. 60 tablet 2   ??? torsemide (DEMADEX) 20 MG tablet Take by mouth two (2) times a day. 40 mg in the morning and 20 mg in the evening       No current facility-administered medications for this visit.       Vital Signs:  There were no vitals filed for this visit.    Physical Exam:  General: Resting, in no apparent distress  HEENT:  PERRL. No scleral icterus or conjunctival injection.   Heart:  RRR.  S1, S2.  No murmurs, gallops or rubs.  No edema noted  Lungs:  Breathing is unlabored, and patient is speaking full sentences with ease.  CTAB. No rales, ronchi or crackles.    Abdomen:  No distention or pain on palpation.  Bowel sounds are present. No palpable masses.  Skin:  No rashes, petechiae  or purpura. Grossly intact.  Right knee mildly swollen, no erythema or tenderness.  Musculoskeletal:    Range of motion about the shoulder, elbow, hips and knees is grossly normal.    Psychiatric:  Range of affect is appropriate.    Neurologic:  Alert and oriented x 4.  Steady gait.    Relevant Laboratory, radiology and pathology results:  No visits with results within 1 Week(s) from this visit.   Latest known visit with results is:   Lab on 01/04/2021   Component Date Value Ref Range Status   ??? Sodium 01/04/2021 139  135 - 145 mmol/L Final   ??? Potassium 01/04/2021 4.6  3.4 - 4.8 mmol/L Final   ??? Chloride 01/04/2021 107  98 - 107 mmol/L Final   ??? CO2 01/04/2021 23.0  20.0 - 31.0 mmol/L Final   ??? Anion Gap 01/04/2021 9  5 - 14 mmol/L Final   ??? BUN 01/04/2021 50 (H)  9 - 23 mg/dL Final   ??? Creatinine 01/04/2021 2.23 (H)  0.60 - 1.10 mg/dL Final   ??? BUN/Creatinine Ratio 01/04/2021 22   Final   ??? eGFR CKD-EPI (2021) Male 01/04/2021 35 (L)  >=60 mL/min/1.47m2 Final    eGFR calculated with CKD-EPI 2021 equation in accordance with [employer] and [employer] of Nephrology Task Force recommendations.   ??? Glucose 01/04/2021 249 (H)  70 - 179 mg/dL Final   ??? Calcium 81/19/1478 9.8  8.7 - 10.4 mg/dL Final   ??? Albumin 29/56/2130 3.8  3.4 - 5.0 g/dL Final   ??? Total Protein 01/04/2021 7.7  5.7 - 8.2 g/dL Final   ??? Total Bilirubin 01/04/2021 0.7  0.3 - 1.2 mg/dL Final   ??? AST 86/57/8469 37 (H)  <=34 U/L Final   ??? ALT 01/04/2021 37  10 - 49 U/L Final   ??? Alkaline Phosphatase 01/04/2021 309 (H)  46 - 116 U/L Final   ??? LDH 01/04/2021 291 (H)  120 - 246 U/L Final   ??? Uric Acid 01/04/2021 5.7  3.7 - 9.2 mg/dL Final   ??? Check Type 01/04/2021 Need Type Check   Final    2nd sample is required for [facility] confirmation.   ??? ABO Grouping 01/04/2021 O POS   Final   ??? Antibody Screen 01/04/2021 NEG   Final   ??? WBC 01/04/2021 5.7  3.6 - 11.2 10*9/L Final   ??? RBC 01/04/2021 5.06  4.26 - 5.60 10*12/L Final   ??? HGB 01/04/2021 13.9  12.9 - 16.5 g/dL Final   ??? HCT 62/95/2841 41.5  39.0 - 48.0 % Final   ??? MCV 01/04/2021 82.1  77.6 - 95.7 fL Final   ??? MCH 01/04/2021 27.4  25.9 - 32.4 pg Final   ??? MCHC 01/04/2021 33.4  32.0 - 36.0 g/dL Final   ??? RDW 32/44/0102 17.3 (H)  12.2 - 15.2 % Final   ??? MPV 01/04/2021 10.4  6.8 - 10.7 fL Final   ??? Platelet 01/04/2021 104 (L)  150 - 450 10*9/L Final   ??? Neutrophils % 01/04/2021 62.6  % Final   ??? Lymphocytes % 01/04/2021 23.7  % Final   ??? Monocytes % 01/04/2021 8.6  % Final   ??? Eosinophils % 01/04/2021 4.3  % Final   ??? Basophils % 01/04/2021 0.8  % Final   ??? Absolute Neutrophils 01/04/2021 3.6  1.8 - 7.8 10*9/L Final   ??? Absolute Lymphocytes 01/04/2021 1.4  1.1 - 3.6 10*9/L Final   ??? Absolute  Monocytes 01/04/2021 0.5  0.3 - 0.8 10*9/L Final   ??? Absolute Eosinophils 01/04/2021 0.2  0.0 - 0.5 10*9/L Final   ??? Absolute Basophils 01/04/2021 0.0  0.0 - 0.1 10*9/L Final   ??? Anisocytosis 01/04/2021 Slight (A)  Not Present Final

## 2021-05-27 ENCOUNTER — Ambulatory Visit: Admit: 2021-05-27 | Discharge: 2021-05-27 | Payer: MEDICARE

## 2021-05-27 ENCOUNTER — Other Ambulatory Visit: Admit: 2021-05-27 | Discharge: 2021-05-27 | Payer: MEDICARE

## 2021-05-27 ENCOUNTER — Ambulatory Visit
Admit: 2021-05-27 | Discharge: 2021-05-27 | Payer: MEDICARE | Attending: Hematology & Oncology | Primary: Hematology & Oncology

## 2021-05-27 DIAGNOSIS — C9210 Chronic myeloid leukemia, BCR/ABL-positive, not having achieved remission: Principal | ICD-10-CM

## 2021-05-27 LAB — CBC W/ AUTO DIFF
BASOPHILS ABSOLUTE COUNT: 0.1 10*9/L (ref 0.0–0.1)
BASOPHILS RELATIVE PERCENT: 0.9 %
EOSINOPHILS ABSOLUTE COUNT: 0.1 10*9/L (ref 0.0–0.5)
EOSINOPHILS RELATIVE PERCENT: 1.2 %
HEMATOCRIT: 43.2 % (ref 39.0–48.0)
HEMOGLOBIN: 14.3 g/dL (ref 12.9–16.5)
LYMPHOCYTES ABSOLUTE COUNT: 1.7 10*9/L (ref 1.1–3.6)
LYMPHOCYTES RELATIVE PERCENT: 23.6 %
MEAN CORPUSCULAR HEMOGLOBIN CONC: 33.1 g/dL (ref 32.0–36.0)
MEAN CORPUSCULAR HEMOGLOBIN: 28.9 pg (ref 25.9–32.4)
MEAN CORPUSCULAR VOLUME: 87.2 fL (ref 77.6–95.7)
MEAN PLATELET VOLUME: 9.5 fL (ref 6.8–10.7)
MONOCYTES ABSOLUTE COUNT: 0.8 10*9/L (ref 0.3–0.8)
MONOCYTES RELATIVE PERCENT: 11 %
NEUTROPHILS ABSOLUTE COUNT: 4.5 10*9/L (ref 1.8–7.8)
NEUTROPHILS RELATIVE PERCENT: 63.3 %
PLATELET COUNT: 150 10*9/L (ref 150–450)
RED BLOOD CELL COUNT: 4.95 10*12/L (ref 4.26–5.60)
RED CELL DISTRIBUTION WIDTH: 15.4 % — ABNORMAL HIGH (ref 12.2–15.2)
WBC ADJUSTED: 7.1 10*9/L (ref 3.6–11.2)

## 2021-05-27 LAB — COMPREHENSIVE METABOLIC PANEL
ALBUMIN: 3.4 g/dL (ref 3.4–5.0)
ALKALINE PHOSPHATASE: 243 U/L — ABNORMAL HIGH (ref 46–116)
ALT (SGPT): 26 U/L (ref 10–49)
ANION GAP: 8 mmol/L (ref 5–14)
AST (SGOT): 31 U/L (ref <=34)
BILIRUBIN TOTAL: 0.7 mg/dL (ref 0.3–1.2)
BLOOD UREA NITROGEN: 44 mg/dL — ABNORMAL HIGH (ref 9–23)
BUN / CREAT RATIO: 14
CALCIUM: 9.5 mg/dL (ref 8.7–10.4)
CHLORIDE: 104 mmol/L (ref 98–107)
CO2: 23 mmol/L (ref 20.0–31.0)
CREATININE: 3.1 mg/dL — ABNORMAL HIGH
EGFR CKD-EPI (2021) MALE: 24 mL/min/{1.73_m2} — ABNORMAL LOW (ref >=60)
GLUCOSE RANDOM: 377 mg/dL — ABNORMAL HIGH (ref 70–179)
POTASSIUM: 4.6 mmol/L (ref 3.4–4.8)
PROTEIN TOTAL: 7.5 g/dL (ref 5.7–8.2)
SODIUM: 135 mmol/L (ref 135–145)

## 2021-05-27 MED ORDER — METOPROLOL SUCCINATE ER 100 MG TABLET,EXTENDED RELEASE 24 HR
ORAL_TABLET | Freq: Every day | ORAL | 1 refills | 30 days | Status: CN
Start: 2021-05-27 — End: ?

## 2021-05-27 MED ORDER — APIXABAN 5 MG TABLET
ORAL_TABLET | Freq: Two times a day (BID) | ORAL | 12 refills | 30 days | Status: CN
Start: 2021-05-27 — End: ?

## 2021-05-27 MED ORDER — PANTOPRAZOLE 40 MG TABLET,DELAYED RELEASE
ORAL_TABLET | Freq: Every day | ORAL | 0 refills | 90 days | Status: CN
Start: 2021-05-27 — End: ?

## 2021-05-27 MED ORDER — ROSUVASTATIN 20 MG TABLET
ORAL_TABLET | Freq: Every day | ORAL | 0 refills | 90 days | Status: CN
Start: 2021-05-27 — End: ?

## 2021-05-27 MED ORDER — PREGABALIN 100 MG CAPSULE
Freq: Three times a day (TID) | ORAL | 0 refills | 0.00000 days | Status: CN
Start: 2021-05-27 — End: ?

## 2021-05-27 MED ORDER — OZEMPIC 0.25 MG OR 0.5 MG (2 MG/1.5 ML) SUBCUTANEOUS PEN INJECTOR
SUBCUTANEOUS | 0 refills | 0 days | Status: CN
Start: 2021-05-27 — End: ?

## 2021-05-27 MED ORDER — TORSEMIDE 20 MG TABLET
Freq: Two times a day (BID) | ORAL | 0 refills | 0 days | Status: CN
Start: 2021-05-27 — End: ?

## 2021-05-27 MED ORDER — FOLIC ACID 1 MG TABLET
ORAL_TABLET | Freq: Every day | ORAL | 3 refills | 90 days | Status: CN
Start: 2021-05-27 — End: ?

## 2021-05-27 MED ORDER — SIMETHICONE 80 MG CHEWABLE TABLET
ORAL_TABLET | Freq: Four times a day (QID) | ORAL | 2 refills | 15.00000 days | Status: CN | PRN
Start: 2021-05-27 — End: ?

## 2021-05-27 MED ORDER — ASCIMINIB 40 MG TABLET
ORAL_TABLET | Freq: Two times a day (BID) | ORAL | 11 refills | 30 days | Status: CN
Start: 2021-05-27 — End: 2021-06-26

## 2021-05-27 NOTE — Unmapped (Addendum)
It was a pleasure to see you today. We have drawn some labs today to measure the levels of CML in your blood, which are pending. It is promising that your blood counts were good today. We will contact you with your results when they become available.  We will see you back in 3 months for follow up. Please continue taking asciminib.     If you have questions or concerns at night or on the weekend, call the hospital operator at [phone] and ask to speak to the hematology/oncology fellow on call.     If you have questions or concerns on a week day during business hours, you may call the Nurse call line at [phone].    Lab on 05/27/2021   Component Date Value Ref Range Status    Sodium 05/27/2021 135  135 - 145 mmol/L Final    Potassium 05/27/2021 4.6  3.4 - 4.8 mmol/L Final    Chloride 05/27/2021 104  98 - 107 mmol/L Final    CO2 05/27/2021 23.0  20.0 - 31.0 mmol/L Final    Anion Gap 05/27/2021 8  5 - 14 mmol/L Final    BUN 05/27/2021 44 (H)  9 - 23 mg/dL Final    Creatinine 42/59/5638 3.10 (H)  0.60 - 1.10 mg/dL Final    BUN/Creatinine Ratio 05/27/2021 14   Final    eGFR CKD-EPI (2021) Male 05/27/2021 24 (L)  >=60 mL/min/1.47m2 Final    eGFR calculated with CKD-EPI 2021 equation in accordance with [employer] and [employer] of Nephrology Task Force recommendations.    Glucose 05/27/2021 377 (H)  70 - 179 mg/dL Final    Calcium 75/64/3329 9.5  8.7 - 10.4 mg/dL Final    Albumin 51/88/4166 3.4  3.4 - 5.0 g/dL Final    Total Protein 05/27/2021 7.5  5.7 - 8.2 g/dL Final    Total Bilirubin 05/27/2021 0.7  0.3 - 1.2 mg/dL Final    AST 10/09/1599 31  <=34 U/L Final    ALT 05/27/2021 26  10 - 49 U/L Final    Alkaline Phosphatase 05/27/2021 243 (H)  46 - 116 U/L Final    Collection 05/27/2021 Collected   Final    WBC 05/27/2021 7.1  3.6 - 11.2 10*9/L Final    RBC 05/27/2021 4.95  4.26 - 5.60 10*12/L Final    HGB 05/27/2021 14.3  12.9 - 16.5 g/dL Final    HCT 09/32/3557 43.2  39.0 - 48.0 % Final MCV 05/27/2021 87.2  77.6 - 95.7 fL Final    MCH 05/27/2021 28.9  25.9 - 32.4 pg Final    MCHC 05/27/2021 33.1  32.0 - 36.0 g/dL Final    RDW 32/20/2542 15.4 (H)  12.2 - 15.2 % Final    MPV 05/27/2021 9.5  6.8 - 10.7 fL Final    Platelet 05/27/2021 150  150 - 450 10*9/L Final    Neutrophils % 05/27/2021 63.3  % Final    Lymphocytes % 05/27/2021 23.6  % Final    Monocytes % 05/27/2021 11.0  % Final    Eosinophils % 05/27/2021 1.2  % Final    Basophils % 05/27/2021 0.9  % Final    Absolute Neutrophils 05/27/2021 4.5  1.8 - 7.8 10*9/L Final    Absolute Lymphocytes 05/27/2021 1.7  1.1 - 3.6 10*9/L Final    Absolute Monocytes 05/27/2021 0.8  0.3 - 0.8 10*9/L Final    Absolute Eosinophils 05/27/2021 0.1  0.0 - 0.5 10*9/L Final  Absolute Basophils 05/27/2021 0.1  0.0 - 0.1 10*9/L Final

## 2021-05-27 NOTE — Unmapped (Signed)
Phlebotomy drawn by Mariane Masters RN. 23g RAC.  Labs drawn and sent for analysis.

## 2021-05-27 NOTE — Unmapped (Signed)
[facility] Follow-up    Patient Name: Colin Hunter  Patient Age: 51 y.o.  Encounter Date: 05/27/2021    Primary Care Provider:  Devota Pace, MD    Referring Physician:  Wyline Beady, MD  [address]  [place],  [place] [address]    Reason for visit: CML follow up    Assessment:  Colin Hunter is a 51 y.o. 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 Given the cardiac and metabolic toxicities of nilotinib, ponatinib and dasatinib, bosutinib was tried as a 2nd line agent, which did not result in molecular response, though drug adherence may have been an issue early in his course. He has been on asciminib 40 mg BID since 12/07/2020 and has had normalization of neutrophilia, monocytosis and eosinophilia (all modest at baseline) consistent with complete hematologic response. He presents today for follow up.  ??  Colin Hunter is doing well today. He is tolerating asciminib well and without missed doses or toxicity, but never returned for his 3 month BCR-ABL in 03/2021. I reiterated the importance of routine follow up. Otherwise, his BCR-ABL is pending. He will RTC in 3 months for a clinic visit and BCR-ABL with monthly BCR-ABLs in the interim.  ??  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 Derinda Sis at [facility]. If he fails asciminib, consultation with cardio-oncology would be reasonable before considering alternate agents that have known cardiac toxicity.  -- Right atrial thrombus: Emerged despite anticoagulation with Eliquis. Since transitioned to Warfarin (as of 05/2019) though has had issues with supratherapeutic INR levels.   -- Hx Syncope/Pre-syncope: perhaps improved risk after ablation for atrial fibrillation.  -- Planning to undergo valve repair over the next several months   ??  CKD Stage 3: Baseline creatinine ~2-3. Likely secondary to diabetic and hypertensive nephropathy. Cr today is 3.1, follow up with PCP.   ??  Covid ppx: 4 covid vaccines. He has had updated booster    Plan and Recommendations:  - Continue asciminib 40 mg PO BID  - F/U BCR-ABL1 PCR results  - RTC in 3 months for clinic visit and BCR-ABL test    ______________________________________________    Documentation assistance was provided Lilly Cove, on 2/16/2023while working as Neurosurgeon for Cindra Presume, MD.    Documentation assistance provided by Medical Scribe, Lilly Cove, who was present during the entirety of the visit. I reviewed the note below and validated all of the information provided to ensure accuracy and completeness.     Cindra Presume, MD      History of Present Illness:  Oncology History Overview Note   CML, chronic phase:   Leukocytosis to ~30 at time of diagnosis with normal platelet count and Hgb.     Bone marrow biopsy 06/26/2015:   CML, chronic phase (2% blasts, 2% promyelocytes)    Aspirate Smear:   The aspirate smear is hypercellular with rare small spicules.   Myeloid elements are increased with full maturation, without significant   cytologic atypia. Erythroid elements are markedly decreased, without significant cytologic atypia. The myeloid to erythroid ratio is >10:1. Many small hypolobated megakaryocytes are present. Storage iron is present; ringed sideroblasts are not identified.     A differential count of 200 nucleated cells shows the following   percentages:   2 Blasts   2 Promyelocytes   31 Myelocytes/Metamyelocytes  51 Bands/Neutrophils   3 Monocytes   3 Eosinophils   1 Basophils   1 Lymphocytes   0 Plasma cells   6 NRBCs     Biopsy:   H&E and PAS stained sections show unremarkable trabecular bone and markedly hypercellular marrow for age (95% cellularity). The myeloid to erythroid ratio is >5:1. Erythroid elements are decreased with full maturation. Myeloid elements are markedly increased with full maturation.   Megakaryocytes are increased with many small hypolobated forms.   Scattered small lymphocytes and plasma cells account for <5% of   cellularity. A reticulin stain shows grade 1 of 3 fibrosis (European Consensus System).     Flow cytometry:   Flow cytometry performed on the bone marrow aspirate ([id number])   demonstrates a non-discrete population of blasts and maturing   myelomonocytic cells within the blast gate (<2% of total events).     CBC:   A tandem CBC shows: WBC 27.9 (55.7% neutrophils, 0.9% bands, 0.9%   metamyelocytes, 11.3% myelocytes, 10.4% lymphocytes, 13.2% monocytes, 1.0%   eosinophils, 4.7% basophils), Hgb 13.4, MCV 87, Plt 224.     Treatment:   Imatinib, 400mg , 06/26/2015-05/17/2019  Bosutiib, 400 mg 10/04/2019 -     BCR/ABL testing:   06/2015: 87.086%  07/17/2015: 140.922%  08/18/2015: 75.693%  12/08/2015: 2.144%  01/07/2016: 2.450%  03/29/2016: 2.008%  07/26/2016: 2.871%; Kinase domain mutation testing negative  10/28/2016: 1.247%  01/05/2017: 0.641%  03/14/2017: 0.348%; Kinase domain mutation testing negative  06/25/19: Result not available   07/26/19: Result not available      07/26/19 FISH: positive t(9;22) in ~90.5% of cells     Final Diagnosis   Date Value Ref Range Status   08/20/2019   Final    Bone marrow, right iliac, aspiration and biopsy  -  Hypercellular bone marrow (95%) involved by chronic myeloid leukemia, BCR-ABL1-positive, chronic phase (2% blasts by manual aspirate differential)  -  See linked reports for associated Ancillary Studies.      This electronic signature is attestation that the pathologist personally reviewed the submitted material(s) and the final diagnosis reflects that evaluation.         08/12/19 BCR/ABL: 28.427 %  08/20/19 BCR/ABL: 35.897%  08/12/19 TKI Resistance Testing: Negative  10/04/19: started bosutinib 400 mg daily  07/03/20: BCR/ABL 68.2%, TKI resistance testing negative  11/11/20: BCR/ABL 74.4%, TKI resistance testing negative  12/09/20: started Asciminib 40 mg bid      Current treatment:   Asciminib 40 mg bid    05/27/21 BCR-ABL: pending     Chronic myeloid leukemia (CMS-HCC)   06/26/2015 Initial Diagnosis    Chronic myeloid leukemia (CMS-HCC)     06/26/2015 - 05/17/2019 Chemotherapy    Imatinib, 400mg  daily; discontinued due to worsening edema     10/04/2019 - 11/2020 Chemotherapy    Bosutinib 400mg      11/11/2020 Progression    Loss of hematologic response with neutrophilia, monocytosis, eosinophilia but not basophilia. Possibly due to non-adherence and association of LE edema with bosutinib as potential toxicity--lack of molecular response but no BCR-ABL point mutations     12/10/2020 -  Chemotherapy    Asciminib 40 mg PO BID     01/04/2021 Remission    Complete hematologic response         Interval History:  Since last seen here, he has been feeling well. He has been taking asciminib as directed, except for missing 9 days of medication when he was hospitalized for COVID and then bronchitis. He denies any major  side effects. No edema. No N/V/D. Occasional muscle aches.     Otherwise, he denies new constitutional symptoms such as anorexia, weight loss, night sweats or unexplained fevers.  Furthermore, he denies symptoms of marrow failure: unexplained bleeding or bruising, recurrent or unexplained intercurrent infections, dyspnea on exertion, lightheadedness, palpitations or chest pain.  There have been no new or unexplained pains or self-identified masses, swelling or enlarged lymph nodes.    Past Medical, Surgical and Family History were reviewed and pertinent updates were made in the Electronic Medical Record    Review of Systems:  Other than as reported above in the interim history, the balance of a full 12-system review was performed and unremarkable.    ECOG Performance Status: 0    Past Medical History:  Past Medical History:   Diagnosis Date   ??? CKD (chronic kidney disease) stage 3, GFR 30-59 ml/min (CMS-HCC)    ??? CML (chronic myelocytic leukemia) (CMS-HCC)    ??? HFrEF (heart failure with reduced ejection fraction) (CMS-HCC)    ??? Paroxysmal atrial fibrillation (CMS-HCC)    ??? PFO (patent foramen ovale)    ??? Right atrial thrombus    ??? T2DM (type 2 diabetes mellitus) (CMS-HCC)        Medications:  Current Outpatient Medications   Medication Sig Dispense Refill   ??? apixaban (ELIQUIS) 5 mg Tab Take 1 tablet (5 mg total) by mouth Two (2) times a day. 60 tablet 12   ??? asciminib (SCEMBLIX) 40 mg tablet Take 1 tablet (40 mg total) by mouth Two (2) times a day. Take on an empty stomach, at least 1 hour before or 2 hours after a meal. Swallow tablets whole. Do not break, crush, or chew the tablets. 60 tablet 11   ??? BINAXNOW COVID-19 AG SELF TEST Kit      ??? blood sugar diagnostic Strp      ??? blood-glucose meter Misc      ??? folic acid (FOLVITE) 1 MG tablet Take 1 tablet (1 mg total) by mouth in the morning. 90 tablet 3   ??? HUMULIN R REGULAR U-100 INSULN 100 unit/mL injection INJECT 15 UNITS SUBCUTANEOUSLY THREE TIMES DAILY BEFORE MEAL(S)     ??? insulin glargine (BASAGLAR, LANTUS) 100 unit/mL (3 mL) injection pen Inject 0.27 mL (27 Units total) under the skin nightly. 15 mL 12   ??? lancets Misc by Miscellaneous route.     ??? metoprolol succinate (TOPROL-XL) 100 MG 24 hr tablet Take 1 tablet (100 mg total) by mouth daily. 30 tablet 1   ??? ONETOUCH VERIO TEST STRIPS Strp USE 1 STRIP TO CHECK GLUCOSE TWICE DAILY     ??? OZEMPIC 0.25 mg or 0.5 mg(2 mg/1.5 mL) PnIj injection Inject 0.5 mg under the skin every seven (7) days.     ??? pantoprazole (PROTONIX) 40 MG tablet Take 40 mg by mouth daily.     ??? pen needle, diabetic 29 gauge x 1/2 (12 mm) Ndle Please substitute for cheapest generic available. Diagnosis E11.9.Z79.4 110 each 0   ??? pregabalin (LYRICA) 100 MG capsule Take 100 mg by mouth Three (3) times a day.     ??? rosuvastatin (CRESTOR) 20 MG tablet Take 1 tablet (20 mg total) by mouth daily. 90 tablet 0   ??? simethicone (MYLICON) 80 MG chewable tablet Chew 1 tablet (80 mg total) every six (6) hours as needed for flatulence. 60 tablet 2   ??? torsemide (DEMADEX) 20 MG tablet Take by mouth two (2) times a day.  40 mg in the morning and 20 mg in the evening       No current facility-administered medications for this visit.       Vital Signs:  BSA: 2.52 meters squared  Vitals:    05/27/21 0936   BP: (S) 149/109   Pulse:    Resp:    Temp:    SpO2:          Physical Exam:  Objective:   Physical Exam: General: Resting in no apparent distress  HEENT:  Pupils are equal, round and reactive to light and accomodation.  There is no scleral icterus and no conjunctival injection.  The oral mucosa does not demonstrate ulceration, erythema, exudate or purpura.    Lymph node exam:  No lymphadenopathy in the occipital, auricular, anterior/posterior cervical regions.  Heart:  Systolic ejection murmur, regular rate and rhythm.  Normal S1 and S2 without S3 or S4.  There are no gallops or rubs.  Lungs:  Breathing is unlabored and patient is speaking full sentences with ease.  No stridor.  Auscultation of lung fields reveals normal air movement without rales, ronchi or crackles.    Abdomen:  No distention or pain on palpation. There is no palpable hepatomegaly or splenomegaly.  No palpable masses.  Skin:  No rashes, petechiae or purpura.  No areas of skin breakdown.  Musculoskeletal:  There are no grossly-evident joint effusions or deformities.  Range of motion about the shoulder, elbow, hips and knees is grossly normal.    Psychiatric:  Alert and oriented to person, place, time and situation.  Range of affect is appropriate.    Neurologic:   Strength 5/5 in upper and lower extremities. Gait is normal.   Extremities:  Appear well-perfused, there is no clubbing, edema or cyanosis.      Relevant Laboratory, radiology and pathology results:  Recent Results (from the past 24 hour(s))   Comprehensive Metabolic Panel    Collection Time: 05/27/21  8:45 AM   Result Value Ref Range    Sodium 135 135 - 145 mmol/L    Potassium 4.6 3.4 - 4.8 mmol/L    Chloride 104 98 - 107 mmol/L    CO2 23.0 20.0 - 31.0 mmol/L    Anion Gap 8 5 - 14 mmol/L    BUN 44 (H) 9 - 23 mg/dL    Creatinine 1.61 (H) 0.60 - 1.10 mg/dL    BUN/Creatinine Ratio 14     eGFR CKD-EPI (2021) Male 24 (L) >=60 mL/min/1.33m2    Glucose 377 (H) 70 - 179 mg/dL    Calcium 9.5 8.7 - 09.6 mg/dL    Albumin 3.4 3.4 - 5.0 g/dL    Total Protein 7.5 5.7 - 8.2 g/dL    Total Bilirubin 0.7 0.3 - 1.2 mg/dL    AST 31 <=04 U/L    ALT 26 10 - 49 U/L    Alkaline Phosphatase 243 (H) 46 - 116 U/L   BCR/ABL1 p210 Blood    Collection Time: 05/27/21  8:45 AM   Result Value Ref Range    Collection Collected    CBC w/ Differential    Collection Time: 05/27/21  8:45 AM   Result Value Ref Range    WBC 7.1 3.6 - 11.2 10*9/L    RBC 4.95 4.26 - 5.60 10*12/L    HGB 14.3 12.9 - 16.5 g/dL    HCT 54.0 98.1 - 19.1 %    MCV 87.2 77.6 - 95.7 fL    MCH 28.9 25.9 -  32.4 pg    MCHC 33.1 32.0 - 36.0 g/dL    RDW 62.1 (H) 30.8 - 15.2 %    MPV 9.5 6.8 - 10.7 fL    Platelet 150 150 - 450 10*9/L    Neutrophils % 63.3 %    Lymphocytes % 23.6 %    Monocytes % 11.0 %    Eosinophils % 1.2 %    Basophils % 0.9 %    Absolute Neutrophils 4.5 1.8 - 7.8 10*9/L    Absolute Lymphocytes 1.7 1.1 - 3.6 10*9/L    Absolute Monocytes 0.8 0.3 - 0.8 10*9/L    Absolute Eosinophils 0.1 0.0 - 0.5 10*9/L    Absolute Basophils 0.1 0.0 - 0.1 10*9/L                 Avie Arenas, MD

## 2021-05-31 NOTE — Unmapped (Signed)
[facility] Refill Coordination Note    Specialty Medication(s) to be Shipped:   Hematology/Oncology: Scemblix    Other medication(s) to be shipped: No additional medications requested for fill at this time     Colin Hunter, DOB: 1970-05-10  Phone: [phone] (home)       All above HIPAA information was verified with patient.     Was a Nurse, learning disability used for this call? No    Completed refill call assessment today to schedule patient's medication shipment from the [facility] [phone]).  All relevant notes have been reviewed.     Specialty medication(s) and dose(s) confirmed: Regimen is correct and unchanged.   Changes to medications: Colin Hunter reports no changes at this time.  Changes to insurance: No  New side effects reported not previously addressed with a pharmacist or physician: None reported  Questions for the pharmacist: No    Confirmed patient received a Conservation officer, historic buildings and a Surveyor, mining with first shipment. The patient will receive a drug information handout for each medication shipped and additional FDA Medication Guides as required.       DISEASE/MEDICATION-SPECIFIC INFORMATION        N/A    SPECIALTY MEDICATION ADHERENCE     Medication Adherence    Patient reported X missed doses in the last month: 0  Specialty Medication: Scemblix 40mg   Patient is on additional specialty medications: No  Informant: patient              Were doses missed due to medication being on hold? No    Scemblix 40 mg: 5 days of medicine on hand       REFERRAL TO PHARMACIST     Referral to the pharmacist: Not needed      [facility]     Shipping address confirmed in Epic.     Delivery Scheduled: Yes, Expected medication delivery date: 06/02/21.     Medication will be delivered via UPS to the prescription address in Epic [place].    Colin Hunter   [facility] Specialty Technician

## 2021-06-02 MED FILL — SCEMBLIX 40 MG TABLET: ORAL | 30 days supply | Qty: 60 | Fill #6

## 2021-07-02 MED FILL — SCEMBLIX 40 MG TABLET: ORAL | 30 days supply | Qty: 60 | Fill #7

## 2021-07-02 NOTE — Unmapped (Signed)
[facility] Clinical Assessment & Refill Coordination Note    Colin Hunter, DOB: September 27, 1970  Phone: [phone] (home)     All above HIPAA information was verified with patient's family member, wife.     Was a Nurse, learning disability used for this call? No    Specialty Medication(s):   Hematology/Oncology: Scemblix 40 mg      Current Outpatient Medications   Medication Sig Dispense Refill   ??? apixaban (ELIQUIS) 5 mg Tab Take 1 tablet (5 mg total) by mouth Two (2) times a day. 60 tablet 12   ??? asciminib (SCEMBLIX) 40 mg tablet Take 1 tablet (40 mg total) by mouth Two (2) times a day. Take on an empty stomach, at least 1 hour before or 2 hours after a meal. Swallow tablets whole. Do not break, crush, or chew the tablets. 60 tablet 11   ??? BINAXNOW COVID-19 AG SELF TEST Kit      ??? blood sugar diagnostic Strp      ??? blood-glucose meter Misc      ??? folic acid (FOLVITE) 1 MG tablet Take 1 tablet (1 mg total) by mouth in the morning. 90 tablet 3   ??? HUMULIN R REGULAR U-100 INSULN 100 unit/mL injection INJECT 15 UNITS SUBCUTANEOUSLY THREE TIMES DAILY BEFORE MEAL(S)     ??? insulin glargine (BASAGLAR, LANTUS) 100 unit/mL (3 mL) injection pen Inject 0.27 mL (27 Units total) under the skin nightly. 15 mL 12   ??? lancets Misc by Miscellaneous route.     ??? metoprolol succinate (TOPROL-XL) 100 MG 24 hr tablet Take 1 tablet (100 mg total) by mouth daily. 30 tablet 1   ??? ONETOUCH VERIO TEST STRIPS Strp USE 1 STRIP TO CHECK GLUCOSE TWICE DAILY     ??? OZEMPIC 0.25 mg or 0.5 mg(2 mg/1.5 mL) PnIj injection Inject 0.5 mg under the skin every seven (7) days.     ??? pantoprazole (PROTONIX) 40 MG tablet Take 40 mg by mouth daily.     ??? pregabalin (LYRICA) 100 MG capsule Take 100 mg by mouth Three (3) times a day.     ??? rosuvastatin (CRESTOR) 20 MG tablet Take 1 tablet (20 mg total) by mouth daily. 90 tablet 0   ??? simethicone (MYLICON) 80 MG chewable tablet Chew 1 tablet (80 mg total) every six (6) hours as needed for flatulence. 60 tablet 2   ??? torsemide (DEMADEX) 20 MG tablet Take by mouth two (2) times a day. 40 mg in the morning and 20 mg in the evening       No current facility-administered medications for this visit.        Changes to medications: [name] reports no changes at this time.    No Known Allergies    Changes to allergies: No    SPECIALTY MEDICATION ADHERENCE     Scemblix 40 mg: wife unsure how many days of medicine on hand     Medication Adherence    Patient reported X missed doses in the last month: 0  Specialty Medication: Scemblix 40 mg one tablet twice daily  Patient is on additional specialty medications: No  Informant: spouse  Confirmed plan for next specialty medication refill: delivery by pharmacy  Refills needed for supportive medications: not needed          Specialty medication(s) dose(s) confirmed: Regimen is correct and unchanged.     Are there any concerns with adherence? No    Adherence counseling provided? Not needed  CLINICAL MANAGEMENT AND INTERVENTION      Clinical Benefit Assessment:    Do you feel the medicine is effective or helping your condition? Yes    Clinical Benefit counseling provided? Not needed    Adverse Effects Assessment:    Are you experiencing any side effects? No    Are you experiencing difficulty administering your medicine? No    Quality of Life Assessment:    Quality of Life    Rheumatology  Oncology  Dermatology  Cystic Fibrosis          How many days over the past month did your CML  keep you from your normal activities? For example, brushing your teeth or getting up in the morning. Patient declined to answer    Have you discussed this with your provider? Not needed    Acute Infection Status:    Acute infections noted within Epic:  No active infections  Patient reported infection: None    Therapy Appropriateness:    Is therapy appropriate and patient progressing towards therapeutic goals? Yes, therapy is appropriate and should be continued    DISEASE/MEDICATION-SPECIFIC INFORMATION      N/A    PATIENT SPECIFIC NEEDS     - Does the patient have any physical, cognitive, or cultural barriers? No    - Is the patient high risk? Yes, patient is taking oral chemotherapy. Appropriateness of therapy as been assessed    - Does the patient require a Care Management Plan? No     SOCIAL DETERMINANTS OF HEALTH     At the [facility], we have learned that life circumstances - like trouble affording food, housing, utilities, or transportation can affect the health of many of our patients.   That is why we wanted to ask: are you currently experiencing any life circumstances that are negatively impacting your health and/or quality of life? No    Social Determinants of Health     Food Insecurity: No Food Insecurity   ??? Worried About Programme researcher, broadcasting/film/video in the Last Year: Never true   ??? Ran Out of Food in the Last Year: Never true   Tobacco Use: Medium Risk   ??? Smoking Tobacco Use: Former   ??? Smokeless Tobacco Use: Never   ??? Passive Exposure: Not on file   Transportation Needs: No Transportation Needs   ??? Lack of Transportation (Medical): No   ??? Lack of Transportation (Non-Medical): No   Alcohol Use: Not At Risk   ??? How often do you have a drink containing alcohol?: Never   ??? How many drinks containing alcohol do you have on a typical day when you are drinking?: 1 - 2   ??? How often do you have 5 or more drinks on one occasion?: Never   Housing/Utilities: Low Risk    ??? Within the past 12 months, have you ever stayed: outside, in a car, in a tent, in an overnight shelter, or temporarily in someone else's home (i.e. couch-surfing)?: No   ??? Are you worried about losing your housing?: No   ??? Within the past 12 months, have you been unable to get utilities (heat, electricity) when it was really needed?: No   Substance Use: Not on file   Financial Resource Strain: Low Risk    ??? Difficulty of Paying Living Expenses: Not hard at all   Physical Activity: Not on file Health Literacy: Medium Risk   ??? : Rarely   Stress: Not on file   Intimate  Partner Violence: Not on file   Depression: Not at risk   ??? PHQ-2 Score: 0   Social Connections: Not on file       Would you be willing to receive help with any of the needs that you have identified today? Not applicable       SHIPPING     Specialty Medication(s) to be Shipped:   Hematology/Oncology: Scemblix 40 mg    Other medication(s) to be shipped: No additional medications requested for fill at this time     Changes to insurance: No    Delivery Scheduled: Yes, Expected medication delivery date: 07/05/21.     Medication will be delivered via UPS to the confirmed prescription address in [facility]    The patient will receive a drug information handout for each medication shipped and additional FDA Medication Guides as required.  Verified that patient has previously received a Conservation officer, historic buildings and a Surveyor, mining.    The patient or caregiver noted above participated in the development of this care plan and knows that they can request review of or adjustments to the care plan at any time.      All of the patient's questions and concerns have been addressed.    [name] [facility] Specialty Pharmacist

## 2021-07-05 NOTE — Unmapped (Signed)
Called to reschedule appt with Simmie Davies MD to discuss next steps re: pulm valve replacement. Tentative plan to schedule on 04/07.

## 2021-07-16 ENCOUNTER — Ambulatory Visit: Admit: 2021-07-16 | Discharge: 2021-07-17 | Payer: MEDICARE

## 2021-07-16 DIAGNOSIS — I4891 Unspecified atrial fibrillation: Principal | ICD-10-CM

## 2021-07-16 DIAGNOSIS — Q221 Congenital pulmonary valve stenosis: Principal | ICD-10-CM

## 2021-07-16 MED ORDER — METOLAZONE 2.5 MG TABLET
ORAL_TABLET | Freq: Every day | ORAL | 3 refills | 5 days | Status: CP
Start: 2021-07-16 — End: ?

## 2021-07-16 NOTE — Unmapped (Signed)
You were seen in the Adult Congenital Heart Clinic today at [facility] at [facility].    TODAY:  - We increased you torsemide to 40mg  a day.   - I ordered a new diuretic, metolazone. Take once daily, 30 minutes before you metolazone   - Weight yourself daily; decease your eating from fast food restaurant.  - We'll plan for a virtual appointment in 1 week to check in.     NEXT APPOINTMENT:  Return to clinic in 1 week with Bailey Mech, NP.     Call the clinic at [phone] with questions.  Our clinic fax number is [phone].  If you need to reschedule future appointments, please call [phone] or [phone]    Our [facility] nurse and patient coordinator, Dorcas Carrow, can be reached at is [phone] if you need further assistance.    After office hours, if you have urgent questions/problems, contact the on-call cardiologist through the hospital operator: [phone].

## 2021-07-16 NOTE — Unmapped (Signed)
ACHD Intake Questionnaire     Since his last visit, Colin Hunter has had an increase of: palpitations and dizziness, per pt palpitations happen about once a week  Exercise: Yes; going to the gym (not consistent yet) does treadmill and weights  Energy level:good  Recent changes in weight:Yes;  Increase    Sexual Health  Sexually active: Yes   Sex drive:good   Contraceptive type: None    Routine Care  PCP: [facility]  Dentist: no dentist   Last cleaning: no recent cleanings    Mental Health Screening  PHQ-2:0  GAD-2: 0    Vaccination History  COVID vaccine: Fully vaccinated and boosted  Flu vaccine:yes

## 2021-07-21 ENCOUNTER — Telehealth
Admit: 2021-07-21 | Discharge: 2021-07-22 | Payer: MEDICARE | Attending: Nurse Practitioner | Primary: Nurse Practitioner

## 2021-07-21 DIAGNOSIS — I4891 Unspecified atrial fibrillation: Principal | ICD-10-CM

## 2021-07-21 DIAGNOSIS — I483 Typical atrial flutter: Principal | ICD-10-CM

## 2021-07-21 NOTE — Unmapped (Signed)
ACHD Clinic Followup Note    Referring Provider: [facility]   Primary Provider: [facility]     Reason for Visit:   Diuresis follow-up  ---    The patient reports they are currently: at home. I spent 15 minutes on the real-time audio and video with the patient on the date of service. I spent an additional 15 minutes on pre- and post-visit activities on the date of service.     The patient was physically located in [place] or a state in which I am permitted to provide care. The patient and/or parent/guardian understood that s/he may incur co-pays and cost sharing, and agreed to the telemedicine visit. The visit was reasonable and appropriate under the circumstances given the patient's presentation at the time.    The patient and/or parent/guardian has been advised of the potential risks and limitations of this mode of treatment (including, but not limited to, the absence of in-person examination) and has agreed to be treated using telemedicine. The patient's/patient's family's questions regarding telemedicine have been answered.     If the visit was completed in an ambulatory setting, the patient and/or parent/guardian has also been advised to contact their provider???s office for worsening conditions, and seek emergency medical treatment and/or call 911 if the patient deems either necessary.     Assessment & Plan:   1. Atrial fibrillation, unspecified type (CMS-HCC); Typical atrial flutter (CMS-HCC); Heart failure with reduced ejection fraction (CMS-HCC)  Colin Hunter has noticed an increase of a positive result with the start of metolazone. Though he hasn't weight himself, he's noticed a decreased amount of swelling to his lower extremities and mid section. He's continued to notice the occasional palpitations at home, unchanged from our visit from Friday.  - Comprehensive metabolic panel tomorrow  - Patient to call in with most recent weight for comparison  - Plan for cardioversion in near future.     ACHD Classification  CHD Anatomy:II (moderate)  Physiologic Stage:C (NYHA III, significant valvular disease, moderate ventricular dysfunction, treated arrhythmia, moderate cyanosis, significant shunt)  - Visit frequency: Every 3 months  - Subacute endocarditis prophylaxis: Do not recommend  - Specialized anesthesia requirement: None    Tests to be completed at next visit: None     No follow-ups on file.    History of Present Illness:     Colin Hunter is a 51 y.o. 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 He also has DM, CKD and HFrEF with last LVEF of 45-50% (reported) and is in Aflutter at the time of the visit.     We've attempted to complete a pulmonary valve replacement for the past 2 years, but there has been continued difficulty: patient tends to present in a-fib/ a-flutter, multiple COVID-19 diagnoses, and getting lost to follow-up. He represented for routine follow-up in September 2022 with plan to complete cardiac CT to evaluate  for a Harmony valve. His evaluation returned as inconclusive, with neither valve being a perfect fit. However, he would be a candidate for the TPV25 based on further evaluation from the [employer] procter.     Cardiac Surgery/Procedure Timeline:    Cardiovascular Studies Date Comments     Surgery In infancy Surgical valvotomy  Cardiac catheterization 11/30/20 Findings:  Low cardiac output and index consistent with known RV failure   Moderate pulmonary hypertension with mild elevation of PVR             Electrophysiology  12/27/19 TEE was negative for RA and LA thrombus. The  patient was sedated by the anesthesia staff with deep sedation. Once he  was adequately sedated, a single biphasic 200J shock was delivered through  anterior-posterior patches.     This successfully converted the patient to normal sinus rhythm.     There were no complications with the procedure    12/03/20 Procedure(s) Performed:  * Electrophysiology study  * 3-Dimensional electroanatomic mapping  * Catheter ablation of the cavotricuspid isthmus (aflutter ablation)      12/07/20  10 day Zio Atrial Flutter occurred continuously (100% burden), ranging from 43-145  bpm (avg of 88 bpm). Bundle Branch Block/IVCD was present. Isolated VEs  were rare (<1.0%), and no VE Couplets or VE Triplets were present.  Ventricular Bigeminy was present     Cardiac Imaging/Studies:     Date Comments     ECG 07/18/21 ATRIAL FLUTTER WITH VARIABLE A-V BLOCK  RIGHT BUNDLE BRANCH BLOCK  INFERIOR INFARCT (CITED ON OR BEFORE 26-Aug-2019)  POSSIBLE ANTEROLATERAL INFARCT  (CITED ON OR BEFORE 26-Aug-2019)   Echo 11/26/20 Summary    1. The left ventricle is normal in size with normal wall thickness.    2. The left ventricular systolic function is mildly decreased, LVEF is  visually estimated at 45%.    3. The left atrium is mildly dilated in size.    4. The right ventricle is severely dilated in size, with moderately reduced  systolic function.    5. The right atrium is moderately dilated  in size.    6. Limited study to assess ventricular function.   CTA 01/06/21 Impressions:  -Pulmonic valve with thin pliable leaflets and incomplete coaptation  during diastole  -Moderately dilated pulmonary arteries  -Dilated right ventricle with moderately reduced systolic function  -Normal left ventricular systolic function   MRI     Stress test       Past Medical & Surgical History:  Reviewed in EMR.    Allergies:  Reviewed in EMR.    Pertinent Medications (complete listing reviewed in EMR):    Prior to Admission medications    Medication Sig Start Date End Date Taking? Authorizing Provider   albuterol HFA 90 mcg/actuation inhaler Inhale 2 puffs every six (6) hours as needed. 04/27/21 04/27/22  Historical Provider, MD   apixaban (ELIQUIS) 5 mg Tab Take 1 tablet (5 mg total) by mouth Two (2) times a day. 01/20/21   [name], MD   asciminib [facility]) 40 mg tablet Take 1 tablet (40 mg total) by mouth Two (2) times a day. Take on an empty stomach, at least 1 hour before or 2 hours after a meal. Swallow tablets whole. Do not break, crush, or chew the tablets. 12/07/20 08/01/21  Kaitlyn Nadara Eaton, CPP   BINAXNOW COVID-19 AG SELF TEST Kit  10/21/20   Historical Provider, MD   blood sugar diagnostic Strp  10/01/19   Historical Provider, MD   blood-glucose meter Misc  08/27/19   Historical Provider, MD   folic acid (FOLVITE) 1 MG tablet Take 1 tablet (1 mg total) by mouth in the morning.  Patient not taking: Reported on 07/16/2021 12/07/20   Guerry Bruin, MD   HUMULIN R REGULAR U-100 INSULN 100 unit/mL injection INJECT  15 UNITS SUBCUTANEOUSLY THREE TIMES DAILY BEFORE MEAL(S) 12/02/20   Historical Provider, MD   insulin glargine (BASAGLAR, LANTUS) 100 unit/mL (3 mL) injection pen Inject 0.27 mL (27 Units total) under the skin nightly. 12/31/20 07/16/21  Wyline Beady, MD   isosorbide dinitrate (ISORDIL) 20 MG tablet Take 1 tablet (20 mg total) by mouth Three (3) times a day.    Historical Provider, MD   lancets Misc by Miscellaneous route.    Historical Provider, MD   metOLazone (ZAROXOLYN) 2.5 MG tablet Take 1 tablet (2.5 mg total) by mouth daily. 07/16/21   Bailey Mech, NP   metoprolol succinate (TOPROL-XL) 100 MG 24 hr tablet Take 1 tablet (100 mg total) by mouth daily. 12/04/20   Janann Colonel Power, MD   ONETOUCH VERIO TEST STRIPS Strp USE 1 STRIP TO CHECK GLUCOSE TWICE DAILY 10/01/19   Historical Provider, MD   OZEMPIC 0.25 mg or 0.5 mg(2 mg/1.5 mL) PnIj injection Inject 0.5 mg under the skin every seven (7) days. 11/19/20   Historical Provider, MD   pantoprazole (PROTONIX) 40 MG tablet Take 1 tablet (40 mg total) by mouth daily.    Historical Provider, MD   pregabalin (LYRICA) 100 MG capsule Take 1 capsule (100 mg total) by mouth Three (3) times a day. 06/08/20   Historical Provider, MD   rosuvastatin (CRESTOR) 20 MG tablet Take 1 tablet (20 mg total) by mouth daily. 12/07/20   Guerry Bruin, MD   simethicone (MYLICON) 80 MG chewable tablet Chew 1 tablet (80 mg total) every six (6) hours as needed for flatulence. 12/22/20   Kaitlyn Marie Buhlinger, CPP   torsemide 40 mg Tab Take 1 tablet (40 mg total) by mouth two (2) times a day. 40 mg in the morning and 20 mg in the evening 07/16/21   Bailey Mech, NP       Review of Systems:   10 systems were reviewed and negative except as noted in HPI.    Physical Exam:         There were no vitals taken for this visit.   Wt Readings from Last 3 Encounters:   07/16/21 (!) 125.6 kg (277 lb)   05/27/21 (!) 121.7 kg (268 lb 4.8 oz)   01/22/21 (!) 117 kg (258 lb)       General:  Alert, no distress.   Neurologic: No focal deficits.       Pertinent Laboratory Studies:      Lab Results   Component Value Date    Creatinine 3.10 (H) 05/27/2021    Potassium 4.6 05/27/2021

## 2021-07-21 NOTE — Unmapped (Signed)
[facility] Refill Coordination Note    Specialty Medication(s) to be Shipped:   Hematology/Oncology: Scemblix    Other medication(s) to be shipped: No additional medications requested for fill at this time     Colin Hunter, DOB: 1970/06/25  Phone: [phone] (home)       All above HIPAA information was verified with patient.     Was a Nurse, learning disability used for this call? No    Completed refill call assessment today to schedule patient's medication shipment from the [facility] [phone]).  All relevant notes have been reviewed.     Specialty medication(s) and dose(s) confirmed: Regimen is correct and unchanged.   Changes to medications: [name] reports no changes at this time.  Changes to insurance: No  New side effects reported not previously addressed with a pharmacist or physician: None reported  Questions for the pharmacist: No    Confirmed patient received a Conservation officer, historic buildings and a Surveyor, mining with first shipment. The patient will receive a drug information handout for each medication shipped and additional FDA Medication Guides as required.       DISEASE/MEDICATION-SPECIFIC INFORMATION        N/A    SPECIALTY MEDICATION ADHERENCE     Medication Adherence    Patient reported X missed doses in the last month: 0  Specialty Medication: Scemblix 40 mg  Patient is on additional specialty medications: No  Informant: patient              Were doses missed due to medication being on hold? No    Scemblix 40 mg: 12 days of medicine on hand       REFERRAL TO PHARMACIST     Referral to the pharmacist: Not needed      [facility]     Shipping address confirmed in Epic.     Delivery Scheduled: Yes, Expected medication delivery date: 07/30/21.     Medication will be delivered via UPS to the prescription address in Epic [place].    Colin Hunter M Colin Hunter   [facility] Specialty Technician

## 2021-07-23 NOTE — Unmapped (Signed)
DIVISION OF CARDIOLOGY  [employer] [place]        Date of Service: 07/16/2021      PCP: Referring Provider:   Beryl Meager [facility]  [address] Attn: Medical Records  [place] [address]  Phone: [phone]  Fax: [phone] [name], MD  [address]  [id number]  [place],  [place] [address]  Phone: [phone]  Fax:      ______________________________________________________________________________________________      ASSESSMENT AND PLAN:       1. 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   - he was lost to follow up and presented with syncope and volume overload.  He has had aflutter ablation.  He had been doing well, but is now volume overloaded again and is back in Flutter.  - will diurese aggessively.  - will schedule for cardioversion.  - schedule for Harmony valve thereafter.    2.  Aflutter  - s/p Aflutter ablation.  He is anticoagulated.      3.  CHF - non-ischemic.  Per patient, he had a cardiac cath at [facility] that did not show significant CAD.      4.  PFO - from previous echos.  No history of stroke.        [name], MD,  [facility], [facility]  Interventional Cardiology  Associate Professor of Medicine  [place] [facility]    ______________________________________________________________________________________________      SUBJECTIVE:     HISTORY OF PRESENT ILLNESS:    Dear physician from [facility],      I had the pleasure of seeing Colin Hunter in our Adult Congenital Heart Disease (ACHD) Clinic today for follow up referred to [place] by Dr. Berline Lopes in regards to Colin Hunter' history of congenital pulmonary stenosis s/p surgical valvotomy as a child.      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  He also has DM, CKD and HFrEF with last LVEF of 45-50% (reported) and is in Aflutter at the time of the visit.      Overall, Colin Hunter state that he has been doing better lately.  He was diagnosed with CML back in 2018 and was started on Gleevec at the time.  He became volume overloaded and experienced quite a bit of orthopnea which led to him being admitted to [facility] about 6 times since September of 2020.  It seems that he has been on a better HF regimen/diuresis since March of 2021 and has not been hospitalized since then.  Overall, he appears well today.  He had an echocardiogram that was read as an EF of 45-50% with moderate PI.  His PI is severe, and has severely dilated RV and RV dysfunction.     We had set him up for cardioversion, but he was found to have a LAA clot.  He then got lost to follow up for about a year with [place] - and he was primarily going to [facility] for his care.  He presented to [place] last month with AFlutter with RVR and in decompensated HF.  We discussed managements - options and he had  an aflutter ablation while he was in the hospital.      We obtained a cardiac CT to screen his candidacy for Harmony valve.  We discussed him in detail and he is a reasonable candidate for the Harmony valve.  Unfortunately, he is now volume overloaded again, and he is back in Aflutter again.    Will increase diuresis.  Then schedule for DC cardioversion.  Then schedule him for Harmony valve.    He reports no lightheadedness, dizziness, palpitations, or syncope.  Patient also denies any overt heart failure symptoms such as orthopnea, paroxysmal nocturnal dyspnea, or worsening lower extremity edema.      Colin Hunter states compliance with his medications and denies any untoward side effects from it.      He works for [employer].       CARDIOVASCULAR HISTORY AND PROCEDURES    Cardiovascular Studies Date Comments     ECG 2021 Aflutter with variable block at 102, RBBB   Echo 2021 EF of 45-50% / Severe RV dilatation and decreased function.  Moderate PI with PASP of 45 mm Hg.   Stress test 2021    Cardiac catheterization 2021    Electrophysiology      Cardiovascular Surgery 1975 Surgical pulmonary valvotomy   Peripheral Vascular Studies         I have reviewed the cardiology tests personally.      PAST MEDICAL HISTORY  Past Medical History:   Diagnosis Date    CKD (chronic kidney disease) stage 3, GFR 30-59 ml/min (CMS-HCC)     CML (chronic myelocytic leukemia) (CMS-HCC)     HFrEF (heart failure with reduced ejection fraction) (CMS-HCC)     Paroxysmal atrial fibrillation (CMS-HCC)     PFO (patent foramen ovale)     Right atrial thrombus     T2DM (type 2 diabetes mellitus) (CMS-HCC)        ALLERGIES  Patient has no known allergies.      CURRENT MEDICATIONS  Current Outpatient Medications   Medication Sig Dispense Refill    albuterol HFA 90 mcg/actuation inhaler Inhale 2 puffs every six (6) hours as needed.      apixaban (ELIQUIS) 5 mg Tab Take 1 tablet (5 mg total) by mouth Two (2) times a day. 60 tablet 12    asciminib (SCEMBLIX) 40 mg tablet Take 1 tablet (40 mg total) by mouth Two (2) times a day. Take on an empty stomach, at least 1 hour before or 2 hours after a meal. Swallow tablets whole. Do not break, crush, or chew the tablets. 60 tablet 11    BINAXNOW COVID-19 AG SELF TEST Kit       blood sugar diagnostic Strp       blood-glucose meter Misc       HUMULIN R REGULAR U-100 INSULN 100 unit/mL injection INJECT 15 UNITS SUBCUTANEOUSLY THREE TIMES DAILY BEFORE MEAL(S)      insulin glargine (BASAGLAR, LANTUS) 100 unit/mL (3 mL) injection pen Inject 0.27 mL (27 Units total) under the skin nightly. 15 mL 12    lancets Misc by Miscellaneous route.      metoprolol succinate (TOPROL-XL) 100 MG 24 hr tablet Take 1 tablet (100 mg total) by mouth daily. 30 tablet 1    ONETOUCH VERIO TEST STRIPS Strp USE 1 STRIP TO CHECK GLUCOSE TWICE DAILY      OZEMPIC 0.25 mg or 0.5 mg(2 mg/1.5 mL) PnIj injection Inject 0.5 mg under the skin every seven (7) days.  pantoprazole (PROTONIX) 40 MG tablet Take 1 tablet (40 mg total) by mouth daily.      pregabalin (LYRICA) 100 MG capsule Take 1 capsule (100 mg total) by mouth Three (3) times a day.      rosuvastatin (CRESTOR) 20 MG tablet Take 1 tablet (20 mg total) by mouth daily. 90 tablet 0    simethicone (MYLICON) 80 MG chewable tablet Chew 1 tablet (80 mg total) every six (6) hours as needed for flatulence. 60 tablet 2    folic acid (FOLVITE) 1 MG tablet Take 1 tablet (1 mg total) by mouth in the morning. (Patient not taking: Reported on 07/16/2021) 90 tablet 3    isosorbide dinitrate (ISORDIL) 20 MG tablet Take 1 tablet (20 mg total) by mouth Three (3) times a day.      metOLazone (ZAROXOLYN) 2.5 MG tablet Take 1 tablet (2.5 mg total) by mouth daily. 5 tablet 3    torsemide 40 mg Tab Take 1 tablet (40 mg total) by mouth two (2) times a day. 40 mg in the morning and 20 mg in the evening 180 tablet 3     No current facility-administered medications for this visit.       FAMILY HISTORY  Negative for early CAD    SOCIAL HISTORY  He  reports that he quit smoking about 2 years ago. His smoking use included cigarettes. He has a 3.00 pack-year smoking history. He has never used smokeless tobacco. He reports that he does not drink alcohol and does not use drugs.      REVIEW OF SYSTEMS    Review of Systems - 10 systems were reviewed and negative except as noted in HPI    Constitutional: negative for - chills, fatigue, fever or night sweats  ENT ROS: negative for - vertigo or visual changes  Hematological and Lymphatic ROS: negative for - blood transfusions, jaundice, night sweats or swollen lymph nodes  Endocrine ROS: negative for - skin changes or temperature intolerance  Respiratory ROS: no cough, shortness of breath, or wheezing negative for - hemoptysis, orthopnea, shortness of breath, tachypnea or wheezing  Cardiovascular ROS:  As reported in HPI  Gastrointestinal ROS: negative for - abdominal pain, hematemesis, melena, nausea/vomiting or swallowing difficulty/pain  Genito-Urinary ROS: negative for - dysuria, incontinence or nocturia  Musculoskeletal ROS: negative for - gait disturbance, joint pain, muscle pain or muscular weakness  Neurological ROS: negative for - behavioral changes, bowel and bladder control changes, headaches, seizures or speech problems    PHYSICAL EXAM     Physical Exam  BP 180/108 (BP Site: L Arm, BP Position: Sitting) Comment: patient did not take BP meds today - Pulse 80  - Ht 188 cm (6' 2)  - Wt (!) 125.6 kg (277 lb)  - SpO2 96%  - BMI 35.56 kg/m??    Wt Readings from Last 3 Encounters:   07/16/21 (!) 125.6 kg (277 lb)   05/27/21 (!) 121.7 kg (268 lb 4.8 oz)   01/22/21 (!) 117 kg (258 lb)       General:  Alert, no distress.   Eyes:  Intact, sclerae anicteric.   Ears, nose, mouth: Moist mucous membranes.Supple, no carotid bruit.    Respiratory:   CTAB bilaterally with normal WOB.   Cardiovascular:  No carotid bruit, no JVD, RRR 2/6 systolic murmur at RUSB   Gastrointestinal:   Normal bowel sounds, soft, NTND.   Musculoskeletal: Normal strength   Skin: Warm, well perfused.   Neurologic: No focal deficits.  Most recent labs   Lab Results   Component Value Date    Sodium 135 05/27/2021    Potassium 4.6 05/27/2021    Chloride 104 05/27/2021    CO2 23.0 05/27/2021    BUN 44 (H) 05/27/2021    Creatinine 3.10 (H) 05/27/2021    Magnesium 1.8 12/07/2020     Lab Results   Component Value Date    HGB 14.3 05/27/2021    Hemoglobin, POC 12.5 (L) 11/30/2020    MCV 87.2 05/27/2021    Platelet 150 05/27/2021     Lab Results   Component Value Date    HGB A1C, POC 9.9 (H) 12/31/2020    TSH 3.009 11/24/2020    PRO-BNP 2,260.0 (H) 08/26/2019    INR 1.14 11/12/2020         *Patient note was created using dragon dictation. Any errors in syntax or proofreading may not have been identified and edited on initial review prior to signing this note.    Thank you very much for allowing me the opportunity to participate in the care of Colin Hunter, who is a delightful patient.  Please do not hesitate to call me if you have any questions.      Sincerely,    [name], MD, [facility], [facility]  Interventional Cardiology  Associate Professor of Medicine  [place] [facility] [place] [facility]

## 2021-07-26 DIAGNOSIS — N1832 Stage 3b chronic kidney disease (CMS-HCC): Principal | ICD-10-CM

## 2021-07-29 MED FILL — SCEMBLIX 40 MG TABLET: ORAL | 30 days supply | Qty: 60 | Fill #8

## 2021-07-30 NOTE — Unmapped (Signed)
Called patient re: most recent weight and labs that were ordered during last visit. Patient stated that he recently went to [facility] ED for worsening back/flank pain and was diagnosed with a 'slipped disc'. During his EC visit, labs were drawn and he weight was completed. Most recent weight = 251 lb, down from 277. Creat = 2.82 no 4/19, down from 3.1 on 2/16. Patient is continued on 40mg  of lasix daily.   Will discuss with primary cardiologist, Simmie Davies MD and EP team to plan for cardioversion. Patient verbalized understanding. All questions answered. All concerns addressed.

## 2021-08-25 DIAGNOSIS — C9210 Chronic myeloid leukemia, BCR/ABL-positive, not having achieved remission: Principal | ICD-10-CM

## 2021-08-25 DIAGNOSIS — I4891 Unspecified atrial fibrillation: Principal | ICD-10-CM

## 2021-08-26 ENCOUNTER — Ambulatory Visit: Admit: 2021-08-26 | Payer: MEDICARE

## 2021-08-27 NOTE — Unmapped (Signed)
Called and LM for pt to r/s missed appt with Angie, LM on machine

## 2021-08-31 NOTE — Unmapped (Signed)
The [facility] has made a second and final attempt to reach this patient to refill the following medication:Scemblix.      We have left voicemails on the following phone numbers: [phone] and have sent a text message to the following phone numbers: [phone] .    Dates contacted: 5/9,16  Last scheduled delivery: 07/29/21    The patient may be at risk of non-compliance with this medication. The patient should call the [facility] at [phone]  Option 4, then Option 1 (oncology) to refill medication.    [name] Margretta Ditty [facility] Specialty Technician  12/03/21

## 2021-09-01 ENCOUNTER — Ambulatory Visit: Admit: 2021-09-01 | Discharge: 2021-09-02 | Payer: MEDICARE

## 2021-09-01 ENCOUNTER — Encounter
Admit: 2021-09-01 | Discharge: 2021-09-02 | Payer: MEDICARE | Attending: Certified Registered" | Primary: Certified Registered"

## 2021-09-01 MED ADMIN — insulin regular (HumuLIN,NovoLIN) injection 15 Units: 15 [IU] | SUBCUTANEOUS | @ 15:00:00 | Stop: 2021-09-01

## 2021-09-01 MED ADMIN — propofoL (DIPRIVAN) injection: INTRAVENOUS | @ 15:00:00 | Stop: 2021-09-01

## 2021-09-01 MED ADMIN — sodium chloride (NS) 0.9 % infusion: INTRAVENOUS | @ 15:00:00 | Stop: 2021-09-01

## 2021-09-01 MED ADMIN — lidocaine (XYLOCAINE) topical spray: @ 15:00:00 | Stop: 2021-09-01

## 2021-09-01 MED ADMIN — propofoL (DIPRIVAN) injection: INTRAVENOUS | @ 16:00:00 | Stop: 2021-09-01

## 2021-09-01 NOTE — Unmapped (Signed)
[facility] Clinical Assessment & Refill Coordination Note    Colin Hunter, DOB: [birth year]  Phone: [phone] (home)     All above HIPAA information was verified with  wife and patient      Was a translator used for this call? No    Specialty Medication(s):   Hematology/Oncology: Scemblix 40 mg     Current Outpatient Medications   Medication Sig Dispense Refill    albuterol HFA 90 mcg/actuation inhaler Inhale 2 puffs every six (6) hours as needed.      apixaban (ELIQUIS) 5 mg Tab Take 1 tablet (5 mg total) by mouth Two (2) times a day. 60 tablet 12    asciminib (SCEMBLIX) 40 mg tablet Take 1 tablet (40 mg total) by mouth Two (2) times a day. Take on an empty stomach, at least 1 hour before or 2 hours after a meal. Swallow tablets whole. Do not break, crush, or chew the tablets. 60 tablet 11    BINAXNOW COVID-19 AG SELF TEST Kit       blood sugar diagnostic Strp       blood-glucose meter Misc       folic acid (FOLVITE) 1 MG tablet Take 1 tablet (1 mg total) by mouth in the morning. 90 tablet 3    HUMULIN R REGULAR U-100 INSULN 100 unit/mL injection INJECT 15 UNITS SUBCUTANEOUSLY THREE TIMES DAILY BEFORE MEAL(S)      insulin glargine (BASAGLAR, LANTUS) 100 unit/mL (3 mL) injection pen Inject 0.27 mL (27 Units total) under the skin nightly. 15 mL 12    isosorbide dinitrate (ISORDIL) 20 MG tablet Take 1 tablet (20 mg total) by mouth Three (3) times a day.      lancets Misc by Miscellaneous route.      lisinopriL (PRINIVIL,ZESTRIL) 5 MG tablet Take 1 tablet (5 mg total) by mouth daily.      metOLazone (ZAROXOLYN) 2.5 MG tablet Take 1 tablet (2.5 mg total) by mouth daily. 5 tablet 3    metoprolol succinate (TOPROL-XL) 100 MG 24 hr tablet Take 1 tablet (100 mg total) by mouth daily. 30 tablet 1    ONETOUCH VERIO TEST STRIPS Strp USE 1 STRIP TO CHECK GLUCOSE TWICE DAILY      OZEMPIC 0.25 mg or 0.5 mg(2 mg/1.5 mL) PnIj injection Inject 0.5 mg under the skin every seven (7) days. pantoprazole (PROTONIX) 40 MG tablet Take 1 tablet (40 mg total) by mouth daily.      pregabalin (LYRICA) 100 MG capsule Take 1 capsule (100 mg total) by mouth Three (3) times a day.      rosuvastatin (CRESTOR) 20 MG tablet Take 1 tablet (20 mg total) by mouth daily. 90 tablet 0    simethicone (MYLICON) 80 MG chewable tablet Chew 1 tablet (80 mg total) every six (6) hours as needed for flatulence. 60 tablet 2    torsemide 40 mg Tab Take 1 tablet (40 mg total) by mouth two (2) times a day. 40 mg in the morning and 20 mg in the evening 180 tablet 3     No current facility-administered medications for this visit.     Facility-Administered Medications Ordered in Other Visits   Medication Dose Route Frequency Provider Last Rate Last Admin    dextrose (D10W) 10% bolus 125 mL  12.5 g Intravenous Q10 Min PRN Kimber Relic, MD        glucagon injection 1 mg  1 mg Intramuscular Once PRN Kimber Relic, MD  glucose chewable tablet 16 g  16 g Oral Q10 Min PRN Kimber Relic, MD            Changes to medications: [name] reports no changes at this time.    No Known Allergies    Changes to allergies: No    SPECIALTY MEDICATION ADHERENCE     Scemblix 40 mg: unsure how many days of medicine on hand     Medication Adherence    Patient reported X missed doses in the last month: 0  Specialty Medication: Scemblix 40 mg - 1 tab (40 mg) twice daily  Patient is on additional specialty medications: No  Informant: patient  Confirmed plan for next specialty medication refill: delivery by pharmacy  Refills needed for supportive medications: not needed          Specialty medication(s) dose(s) confirmed: Regimen is correct and unchanged.     Are there any concerns with adherence?  [facility] last dispensed 30 day supply on 07/29/21.  Patient denies missed doses and reports having enough medication through 09/09/21 and maybe more    Adherence counseling provided?  Not at this time    CLINICAL MANAGEMENT AND INTERVENTION      Clinical Benefit Assessment:    Do you feel the medicine is effective or helping your condition? Patient declined to answer    Clinical Benefit counseling provided? Not needed    Adverse Effects Assessment:    Are you experiencing any side effects? No    Are you experiencing difficulty administering your medicine? No    Quality of Life Assessment:    Quality of Life    Rheumatology  Oncology  1. What impact has your specialty medication had on the reduction of your daily pain or discomfort level?: None  2. On a scale of 1-10, how would you rate your ability to manage side effects associated with your specialty medication? (1=no issues, 10 = unable to take medication due to side effects): 1  Dermatology  Cystic Fibrosis          How many days over the past month did your CML  keep you from your normal activities? For example, brushing your teeth or getting up in the morning. 0    Have you discussed this with your provider? Not needed    Acute Infection Status:    Acute infections noted within Epic:  No active infections  Patient reported infection: None    Therapy Appropriateness:    Is therapy appropriate and patient progressing towards therapeutic goals? Yes, therapy is appropriate and should be continued    DISEASE/MEDICATION-SPECIFIC INFORMATION      N/A    PATIENT SPECIFIC NEEDS     Does the patient have any physical, cognitive, or cultural barriers? No    Is the patient high risk? Yes, patient is taking oral chemotherapy. Appropriateness of therapy as been assessed    Does the patient require a Care Management Plan? No     SOCIAL DETERMINANTS OF HEALTH     At the [facility], we have learned that life circumstances - like trouble affording food, housing, utilities, or transportation can affect the health of many of our patients.   That is why we wanted to ask: are you currently experiencing any life circumstances that are negatively impacting your health and/or quality of life? Patient declined to answer (did not discuss)    Social Determinants of Health     Financial Resource Strain: Low Risk  Difficulty of Paying Living Expenses: Not hard at all   Internet Connectivity: Not on file   Food Insecurity: No Food Insecurity    Worried About Programme researcher, broadcasting/film/video in the Last Year: Never true    Ran Out of Food in the Last Year: Never true   Tobacco Use: Medium Risk    Smoking Tobacco Use: Former    Smokeless Tobacco Use: Never    Passive Exposure: Not on file   Housing/Utilities: Low Risk     Within the past 12 months, have you ever stayed: outside, in a car, in a tent, in an overnight shelter, or temporarily in someone else's home (i.e. couch-surfing)?: No    Are you worried about losing your housing?: No    Within the past 12 months, have you been unable to get utilities (heat, electricity) when it was really needed?: No   Alcohol Use: Not At Risk    How often do you have a drink containing alcohol?: Never    How many drinks containing alcohol do you have on a typical day when you are drinking?: 1 - 2    How often do you have 5 or more drinks on one occasion?: Never   Transportation Needs: No Transportation Needs    Lack of Transportation (Medical): No    Lack of Transportation (Non-Medical): No   Substance Use: Not on file   Health Literacy: Medium Risk    : Rarely   Physical Activity: Not on file   Interpersonal Safety: Not on file   Stress: Not on file   Intimate Partner Violence: Not on file   Depression: Not at risk    PHQ-2 Score: 0   Social Connections: Not on file       Would you be willing to receive help with any of the needs that you have identified today? Not applicable       SHIPPING     Specialty Medication(s) to be Shipped:   Hematology/Oncology: Scemblix    Other medication(s) to be shipped:  none     Changes to insurance: No    Delivery Scheduled: Yes, Expected medication delivery date: 09/09/21.     Medication will be delivered via UPS to the confirmed prescription address in [facility].    The patient will receive a drug information handout for each medication shipped and additional FDA Medication Guides as required.  Verified that patient has previously received a Conservation officer, historic buildings and a Surveyor, mining.    The patient or caregiver noted above participated in the development of this care plan and knows that they can request review of or adjustments to the care plan at any time.      All of the patient's questions and concerns have been addressed.    [name] Shared [facility] Specialty Pharmacist

## 2021-09-01 NOTE — Unmapped (Signed)
Cardiac Electrophysiology  History & Physical    History of Present Illness:   Colin Hunter is a(n) 51 y.o. 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 Presents for direct current cardioversion.     His Past Medical History, Problem List, Family and Social History, Allergies, and Medication List have been reviewed and updated in Epic.    Review of Systems:  As stated in the HPI, otherwise 10-point review of systems is negative    Physical Exam:  General:  Alert, no distress.   Eyes:  Intact, sclerae anicteric.   Ears, nose, mouth: Moist mucous membranes.Supple, no carotid bruit.    Respiratory:   CTAB bilaterally with normal WOB.   Cardiovascular:  No carotid bruit, no JVD, RRR 2/6 systolic murmur at RUSB   Gastrointestinal:   Normal bowel sounds, soft, NTND.   Musculoskeletal: Normal strength   Skin: Warm, well perfused.   Neurologic: No focal deficits.       Electrocardiogram:  Atrial flutter with variable block    Labs, Reports, and available Outside records have been reviewed.    Lab Results   Component Value Date    WBC 7.1 05/27/2021    HGB 14.3 05/27/2021    Hemoglobin, POC 12.5 (L) 11/30/2020    HCT 43.2 05/27/2021    Platelet 150 05/27/2021    INR 1.14 11/12/2020    Creatinine 3.10 (H) 05/27/2021    Sodium 135 05/27/2021    Potassium 4.6 05/27/2021    Magnesium 1.8 12/07/2020    PRO-BNP 2,260.0 (H) 08/26/2019    TSH 3.009 11/24/2020       ASSESSMENT AND PLAN   Colin Hunter is a(n) 51 y.o. male with the above stated history who has been referred for direct current cardioversion.    SITE MARKING ATTESTATION   Site Marked: Yes    CONSENT FOR OPERATION OR PROCEDURE: PROVIDER CERTIFICATION   I hereby certify that the nature, purpose, benefits, usual and most frequent risks of, and alternatives to, the operation or procedure have been explained to the patient (or person authorized to sign for the patient) either by a physician or by the provider who is to perform the operation or procedure, that the patient has had an opportunity to ask questions, and that those questions have been answered. The patient or the patient's representative has been advised that selected tasks may be performed by assistants to the primary health care provider(s). I believe that the patient (or person authorized to sign for the patient) understands what has been explained, and has consented to the operation or procedure.

## 2021-09-01 NOTE — Unmapped (Signed)
Echocardiography Laboratory  [place], [place]  Tel: [phone]       HISTORY & PHYSICAL ASSESSMENT    PCP:  [facility]  Phone:  [phone]  Fax:  [phone]    Referring Physicians:  Bailey Mech, Np  [id number]  [place],  [place] [address]     Primary Cardiologist:  [name], MD    Procedure to be performed: Transesophageal echocardiogram with General Anesthesia    Indication(s): flutter    Consent:  I hereby certify that the nature, purpose, benefits, usual and most frequent risks of, and alternatives to, the operation or procedure have been explained to the patient (or person authorized to sign for the patient) either by a physician or by the provider who is to perform the operation or procedure, that the patient has had an opportunity to ask questions, and that those questions have been answered. The patient or the patient's representative has been advised that selected tasks may be performed by assistants to the primary health care provider(s). I believe that the patient (or person authorized to sign for the patient) understands what has been explained, and has consented to the operation or procedure.      ___________________________________________________________________________      History: Colin Hunter is a(n) 51 y.o. 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     ___________________________________________________________________________      NPO Status: NPO - with no solids or tube feeds for >6 hours and no liquids for >4 hours    Relative contraindications: None    Findings of prior TTE/TEE: Transthoracic echocardiogram: LVEF 45%, mildly dilated LA, severe RV dilation w/moderately reduced function    Procedural aspects of prior TEEs: Sedation Used: fentanyl, midazolam, Minimal difficulty with probe insertion, and No complications    History of cardiac surgery or implants:  surgical valvotomy as a child    History of complications with anesthesia, sedation, or intubation: None    History of sleep apnea: No    Current Pertinent Medications (Anticoagulant, Antiplatelet, Sedating Agents):  apixaban    Pertinent Allergies: None pertinent to procedure    History of tobacco, alcohol, substance use: History of tobacco use, former    Isolation Precautions: None  ___________________________________________________________________________    Vitals:    09/01/21 1029   BP: 175/112   Pulse: 84   Resp: 24   SpO2: 96%     There is no height or weight on file to calculate BMI.    Physical Exam:   Cardiac: RRR, no murmurs.  Pulmonary: CTAB, normal work of breathing  Airway: BMI >35  Patient is able to lie flat comfortably

## 2021-09-02 NOTE — Unmapped (Signed)
Called and LM for pt

## 2021-09-02 NOTE — Unmapped (Signed)
Called and LM for patient to get his previously missed appt r/s with nichols.

## 2021-09-08 MED FILL — SCEMBLIX 40 MG TABLET: ORAL | 30 days supply | Qty: 60 | Fill #9

## 2021-09-08 NOTE — Unmapped (Signed)
Called to reschedule missed appt

## 2021-09-22 NOTE — Unmapped (Signed)
Called patient to check in after DCCV and discuss next steps. He's states he's been feeling well since his cardioversion, but he has noticed increased swelling to his abdomen after he eats. Last weight = 267, and increase of last verbalized weight on 251 one month ago. He denies peripheral edema, shortness of breath or worsening chest pain/pressure. Patient agreed to the plan below. All questions answered. All concerns addressed.      Plan   - proceed with scheduling Harmony valve with Drs. Karl Bales and Comfort   - EKG and labs to be drawn at local facility prior to procedure

## 2021-10-05 NOTE — Unmapped (Signed)
[facility] Clinical Assessment & Refill Coordination Note    Colin Hunter, DOB: [birth year]  Phone: [phone] (home)     All above HIPAA information was verified with patient's family member, wife.     Was a Nurse, learning disability used for this call? No    Specialty Medication(s):   Hematology/Oncology: Scemblix     Current Outpatient Medications   Medication Sig Dispense Refill    albuterol HFA 90 mcg/actuation inhaler Inhale 2 puffs every six (6) hours as needed.      apixaban (ELIQUIS) 5 mg Tab Take 1 tablet (5 mg total) by mouth Two (2) times a day. 60 tablet 12    asciminib (SCEMBLIX) 40 mg tablet Take 1 tablet (40 mg total) by mouth Two (2) times a day. Take on an empty stomach, at least 1 hour before or 2 hours after a meal. Swallow tablets whole. Do not break, crush, or chew the tablets. 60 tablet 11    BINAXNOW COVID-19 AG SELF TEST Kit       blood sugar diagnostic Strp       blood-glucose meter Misc       folic acid (FOLVITE) 1 MG tablet Take 1 tablet (1 mg total) by mouth in the morning. 90 tablet 3    HUMULIN R REGULAR U-100 INSULN 100 unit/mL injection INJECT 15 UNITS SUBCUTANEOUSLY THREE TIMES DAILY BEFORE MEAL(S)      insulin glargine (BASAGLAR, LANTUS) 100 unit/mL (3 mL) injection pen Inject 0.27 mL (27 Units total) under the skin nightly. 15 mL 12    isosorbide dinitrate (ISORDIL) 20 MG tablet Take 1 tablet (20 mg total) by mouth Three (3) times a day.      lancets Misc by Miscellaneous route.      lisinopriL (PRINIVIL,ZESTRIL) 5 MG tablet Take 1 tablet (5 mg total) by mouth daily.      metOLazone (ZAROXOLYN) 2.5 MG tablet Take 1 tablet (2.5 mg total) by mouth daily. 5 tablet 3    metoprolol succinate (TOPROL-XL) 100 MG 24 hr tablet Take 1 tablet (100 mg total) by mouth daily. 30 tablet 1    ONETOUCH VERIO TEST STRIPS Strp USE 1 STRIP TO CHECK GLUCOSE TWICE DAILY      OZEMPIC 0.25 mg or 0.5 mg(2 mg/1.5 mL) PnIj injection Inject 0.5 mg under the skin every seven (7) days. pantoprazole (PROTONIX) 40 MG tablet Take 1 tablet (40 mg total) by mouth daily.      pregabalin (LYRICA) 100 MG capsule Take 1 capsule (100 mg total) by mouth Three (3) times a day.      rosuvastatin (CRESTOR) 20 MG tablet Take 1 tablet (20 mg total) by mouth daily. 90 tablet 0    simethicone (MYLICON) 80 MG chewable tablet Chew 1 tablet (80 mg total) every six (6) hours as needed for flatulence. 60 tablet 2    torsemide 40 mg Tab Take 1 tablet (40 mg total) by mouth two (2) times a day. 40 mg in the morning and 20 mg in the evening 180 tablet 3     No current facility-administered medications for this visit.        Changes to medications: Colin Hunter reports no changes at this time.    No Known Allergies    Changes to allergies: No    SPECIALTY MEDICATION ADHERENCE     Scemblix 40 mg: wife is unsure how many days of medicine on hand     Medication Adherence    Patient reported X  missed doses in the last month: 0  Specialty Medication: Scemblix 40 mg - 1 tablet (40 mg) twice daily  Patient is on additional specialty medications: No  Informant: spouse  Confirmed plan for next specialty medication refill: delivery by pharmacy  Refills needed for supportive medications: not needed          Specialty medication(s) dose(s) confirmed: Regimen is correct and unchanged.     Are there any concerns with adherence? No    Adherence counseling provided? Not needed    CLINICAL MANAGEMENT AND INTERVENTION      Clinical Benefit Assessment:    Do you feel the medicine is effective or helping your condition? Yes    Clinical Benefit counseling provided? Not needed    Adverse Effects Assessment:    Are you experiencing any side effects? No    Are you experiencing difficulty administering your medicine? No    Quality of Life Assessment:    Quality of Life    Rheumatology  Oncology  Dermatology  Cystic Fibrosis          How many days over the past month did your CML  keep you from your normal activities? For example, brushing your teeth or getting up in the morning. Patient declined to answer    Have you discussed this with your provider? Not needed    Acute Infection Status:    Acute infections noted within Epic:  No active infections  Patient reported infection: None    Therapy Appropriateness:    Is therapy appropriate and patient progressing towards therapeutic goals? Yes, therapy is appropriate and should be continued    DISEASE/MEDICATION-SPECIFIC INFORMATION      N/A    PATIENT SPECIFIC NEEDS     Does the patient have any physical, cognitive, or cultural barriers? No    Is the patient high risk? Yes, patient is taking oral chemotherapy. Appropriateness of therapy as been assessed    Does the patient require a Care Management Plan? No     SOCIAL DETERMINANTS OF HEALTH     At the [facility], we have learned that life circumstances - like trouble affording food, housing, utilities, or transportation can affect the health of many of our patients.   That is why we wanted to ask: are you currently experiencing any life circumstances that are negatively impacting your health and/or quality of life? No    Social Determinants of Psychologist, prison and probation services Strain: Low Risk     Difficulty of Paying Living Expenses: Not hard at all   Internet Connectivity: Not on file   Food Insecurity: No Food Insecurity    Worried About Programme researcher, broadcasting/film/video in the Last Year: Never true    Barista in the Last Year: Never true   Tobacco Use: Medium Risk    Smoking Tobacco Use: Former    Smokeless Tobacco Use: Never    Passive Exposure: Not on file   Housing/Utilities: Low Risk     Within the past 12 months, have you ever stayed: outside, in a car, in a tent, in an overnight shelter, or temporarily in someone else's home (i.e. couch-surfing)?: No    Are you worried about losing your housing?: No    Within the past 12 months, have you been unable to get utilities (heat, electricity) when it was really needed?: No   Alcohol Use: Not At Risk    How often do you have a drink containing alcohol?:  Never    How many drinks containing alcohol do you have on a typical day when you are drinking?: 1 - 2    How often do you have 5 or more drinks on one occasion?: Never   Transportation Needs: No Transportation Needs    Lack of Transportation (Medical): No    Lack of Transportation (Non-Medical): No   Substance Use: Not on file   Health Literacy: Medium Risk    : Rarely   Physical Activity: Not on file   Interpersonal Safety: Not on file   Stress: Not on file   Intimate Partner Violence: Not on file   Depression: Not at risk    PHQ-2 Score: 0   Social Connections: Not on file       Would you be willing to receive help with any of the needs that you have identified today? Not applicable       SHIPPING     Specialty Medication(s) to be Shipped:   Hematology/Oncology: Scemblix    Other medication(s) to be shipped: No additional medications requested for fill at this time     Changes to insurance: No    Delivery Scheduled: Yes, Expected medication delivery date: 10/08/21.     Medication will be delivered via UPS to the confirmed prescription address in [facility].    The patient will receive a drug information handout for each medication shipped and additional FDA Medication Guides as required.  Verified that patient has previously received a Conservation officer, historic buildings and a Surveyor, mining.    The patient or caregiver noted above participated in the development of this care plan and knows that they can request review of or adjustments to the care plan at any time.      All of the patient's questions and concerns have been addressed.    [name] Shared [facility] Specialty Pharmacist

## 2021-10-07 MED FILL — SCEMBLIX 40 MG TABLET: ORAL | 30 days supply | Qty: 60 | Fill #10

## 2021-10-27 ENCOUNTER — Other Ambulatory Visit: Admit: 2021-10-27 | Discharge: 2021-10-28 | Disposition: A | Discharge status: Home / Self Care | Payer: MEDICARE

## 2021-10-27 ENCOUNTER — Ambulatory Visit: Admit: 2021-10-27 | Discharge: 2021-10-28 | Disposition: A | Discharge status: Home / Self Care | Payer: MEDICARE

## 2021-10-27 DIAGNOSIS — C9210 Chronic myeloid leukemia, BCR/ABL-positive, not having achieved remission: Principal | ICD-10-CM

## 2021-10-27 LAB — COMPREHENSIVE METABOLIC PANEL
ALBUMIN: 3.3 g/dL — ABNORMAL LOW (ref 3.4–5.0)
ALBUMIN: 3.5 g/dL (ref 3.4–5.0)
ALKALINE PHOSPHATASE: 224 U/L — ABNORMAL HIGH (ref 46–116)
ALKALINE PHOSPHATASE: 242 U/L — ABNORMAL HIGH (ref 46–116)
ALT (SGPT): 16 U/L (ref 10–49)
ALT (SGPT): 16 U/L (ref 10–49)
ANION GAP: 10 mmol/L (ref 5–14)
ANION GAP: 9 mmol/L (ref 5–14)
AST (SGOT): 17 U/L (ref <=34)
AST (SGOT): 22 U/L (ref <=34)
BILIRUBIN TOTAL: 0.7 mg/dL (ref 0.3–1.2)
BILIRUBIN TOTAL: 0.7 mg/dL (ref 0.3–1.2)
BLOOD UREA NITROGEN: 31 mg/dL — ABNORMAL HIGH (ref 9–23)
BLOOD UREA NITROGEN: 29 mg/dL — ABNORMAL HIGH (ref 9–23)
BUN / CREAT RATIO: 10
BUN / CREAT RATIO: 10
CALCIUM: 9.8 mg/dL (ref 8.7–10.4)
CALCIUM: 9.7 mg/dL (ref 8.7–10.4)
CHLORIDE: 94 mmol/L — ABNORMAL LOW (ref 98–107)
CHLORIDE: 95 mmol/L — ABNORMAL LOW (ref 98–107)
CO2: 26 mmol/L (ref 20.0–31.0)
CO2: 25 mmol/L (ref 20.0–31.0)
CREATININE: 3.12 mg/dL — ABNORMAL HIGH
CREATININE: 3.03 mg/dL — ABNORMAL HIGH
EGFR CKD-EPI (2021) MALE: 23 mL/min/{1.73_m2} — ABNORMAL LOW (ref >=60)
EGFR CKD-EPI (2021) MALE: 24 mL/min/{1.73_m2} — ABNORMAL LOW (ref >=60)
GLUCOSE RANDOM: 601 mg/dL (ref 70–179)
GLUCOSE RANDOM: 571 mg/dL (ref 70–179)
POTASSIUM: 4.2 mmol/L (ref 3.4–4.8)
POTASSIUM: 4.9 mmol/L — ABNORMAL HIGH (ref 3.4–4.8)
PROTEIN TOTAL: 7.2 g/dL (ref 5.7–8.2)
PROTEIN TOTAL: 7.5 g/dL (ref 5.7–8.2)
SODIUM: 130 mmol/L — ABNORMAL LOW (ref 135–145)
SODIUM: 129 mmol/L — ABNORMAL LOW (ref 135–145)

## 2021-10-27 LAB — URINALYSIS WITH MICROSCOPY WITH CULTURE REFLEX
BACTERIA: NONE SEEN /HPF
BILIRUBIN UA: NEGATIVE
BLOOD UA: NEGATIVE
GLUCOSE UA: >1000 — AB
HYALINE CASTS: 1 /LPF (ref 0–1)
KETONES UA: NEGATIVE
LEUKOCYTE ESTERASE UA: NEGATIVE
NITRITE UA: NEGATIVE
PH UA: 6 (ref 5.0–9.0)
PROTEIN UA: 100 — AB
RBC UA: <1 /HPF (ref <=3)
SPECIFIC GRAVITY UA: 1.018 (ref 1.003–1.030)
SQUAMOUS EPITHELIAL: <1 /HPF (ref 0–5)
UROBILINOGEN UA: <2
WBC UA: <1 /HPF (ref <=2)

## 2021-10-27 LAB — CBC W/ AUTO DIFF
BASOPHILS ABSOLUTE COUNT: 0.1 10*9/L (ref 0.0–0.1)
BASOPHILS ABSOLUTE COUNT: 0.1 10*9/L (ref 0.0–0.1)
BASOPHILS RELATIVE PERCENT: 0.8 %
BASOPHILS RELATIVE PERCENT: 0.7 %
EOSINOPHILS ABSOLUTE COUNT: 0.1 10*9/L (ref 0.0–0.5)
EOSINOPHILS ABSOLUTE COUNT: 0.1 10*9/L (ref 0.0–0.5)
EOSINOPHILS RELATIVE PERCENT: 1.1 %
EOSINOPHILS RELATIVE PERCENT: 1.2 %
HEMATOCRIT: 47.2 % (ref 39.0–48.0)
HEMATOCRIT: 49.9 % — ABNORMAL HIGH (ref 39.0–48.0)
HEMOGLOBIN: 16.2 g/dL (ref 12.9–16.5)
HEMOGLOBIN: 17.2 g/dL — ABNORMAL HIGH (ref 12.9–16.5)
LYMPHOCYTES ABSOLUTE COUNT: 1.6 10*9/L (ref 1.1–3.6)
LYMPHOCYTES ABSOLUTE COUNT: 2.2 10*9/L (ref 1.1–3.6)
LYMPHOCYTES RELATIVE PERCENT: 17.3 %
LYMPHOCYTES RELATIVE PERCENT: 21.6 %
MEAN CORPUSCULAR HEMOGLOBIN CONC: 34.3 g/dL (ref 32.0–36.0)
MEAN CORPUSCULAR HEMOGLOBIN CONC: 34.5 g/dL (ref 32.0–36.0)
MEAN CORPUSCULAR HEMOGLOBIN: 29.1 pg (ref 25.9–32.4)
MEAN CORPUSCULAR HEMOGLOBIN: 29.1 pg (ref 25.9–32.4)
MEAN CORPUSCULAR VOLUME: 84.7 fL (ref 77.6–95.7)
MEAN CORPUSCULAR VOLUME: 84.4 fL (ref 77.6–95.7)
MEAN PLATELET VOLUME: 9.7 fL (ref 6.8–10.7)
MEAN PLATELET VOLUME: 9.1 fL (ref 6.8–10.7)
MONOCYTES ABSOLUTE COUNT: 0.8 10*9/L (ref 0.3–0.8)
MONOCYTES ABSOLUTE COUNT: 0.9 10*9/L — ABNORMAL HIGH (ref 0.3–0.8)
MONOCYTES RELATIVE PERCENT: 8.8 %
MONOCYTES RELATIVE PERCENT: 9.1 %
NEUTROPHILS ABSOLUTE COUNT: 6.6 10*9/L (ref 1.8–7.8)
NEUTROPHILS ABSOLUTE COUNT: 6.8 10*9/L (ref 1.8–7.8)
NEUTROPHILS RELATIVE PERCENT: 72 %
NEUTROPHILS RELATIVE PERCENT: 67.4 %
PLATELET COUNT: 141 10*9/L — ABNORMAL LOW (ref 150–450)
PLATELET COUNT: 133 10*9/L — ABNORMAL LOW (ref 150–450)
RED BLOOD CELL COUNT: 5.57 10*12/L (ref 4.26–5.60)
RED BLOOD CELL COUNT: 5.91 10*12/L — ABNORMAL HIGH (ref 4.26–5.60)
RED CELL DISTRIBUTION WIDTH: 15.1 % (ref 12.2–15.2)
RED CELL DISTRIBUTION WIDTH: 14.9 % (ref 12.2–15.2)
WBC ADJUSTED: 9.1 10*9/L (ref 3.6–11.2)
WBC ADJUSTED: 10.1 10*9/L (ref 3.6–11.2)

## 2021-10-27 LAB — BETA HYDROXYBUTYRATE: BETA-HYDROXYBUTYRATE: 0.14 mmol/L (ref 0.02–0.27)

## 2021-10-27 MED ADMIN — sodium chloride 0.9% (NS) bolus 1,000 mL: 1000 mL | INTRAVENOUS | @ 22:00:00 | Stop: 2021-10-27

## 2021-10-27 NOTE — Unmapped (Signed)
Patient rounds completed. The following patient needs were addressed:  Patient continually sitting in the waiting room awaiting the room opening .     Pt assistance to bathroom and back to waiting area awaiting the room opening.

## 2021-10-27 NOTE — Unmapped (Signed)
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 Patient transported with house supervisor, support staff and security to ED via wheelchair.

## 2021-10-27 NOTE — Unmapped (Signed)
Sent down from onc due to not feeling well since last Thurs. BG > 600 today at clinic. Endorses diarrhea, cough, abdominal pain, dizziness/lightheadedness, weakness and intermittent chest pain

## 2021-10-27 NOTE — Unmapped (Addendum)
[facility] Follow-up    Patient Name: Colin Hunter  Patient Age: 51 y.o.  Encounter Date: 10/27/2021    Primary Care Provider:  [facility]    Referring Physician:  Wyline Beady, MD  [address]  [place],  [place] [address]    Reason for visit: CML follow up    Assessment:  Colin Hunter is a 52 y.o. 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 Given the cardiac and metabolic toxicities of nilotinib, ponatinib and dasatinib, bosutinib was tried as a 2nd line agent, which did not result in molecular response, though drug adherence may have been an issue early in his course. He has been on asciminib 40 mg BID since 12/07/2020 and has had normalization of neutrophilia, monocytosis and eosinophilia (all modest at baseline) consistent with complete hematologic response. He presents today for follow up.     Colin Hunter is not feeling well today. He has had 1 week of diarrhea and malaise. He states he has been taking his asciminib without missed doses until he vomited and started having diarrhea on Friday. He took his AM dose on Friday, held PM dose, held Sat, Sun, Mon, and Tues. He took his AM dose today. His BCR-ABL is pending today.  On lab review today he is noted to have blood glucose >600.  Given his increased diarrhea, lethargy electrolyte imbalance we will transfer him to the ED for more thorough work up and management.  I advised he should hold his asciminib until he is through this acute illness and restart as soon as possible. He will RTC in 3 months for a clinic visit and collect BCR-ABL with clinic visits.      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 Derinda Sis at [facility]. If he fails asciminib, consultation with cardio-oncology would be reasonable before considering alternate agents that have known cardiac toxicity.  -- Right atrial thrombus: Emerged despite anticoagulation with Eliquis. Since transitioned to Warfarin (as of 05/2019) though has had issues with supratherapeutic INR levels.   -- Cardioversion 5/23 due to [facility] with atrial flutter    CKD Stage 3: Baseline creatinine ~2-3. Likely secondary to diabetic and hypertensive nephropathy. Cr today is 3.12, stable.     Covid ppx: 4 covid vaccines. He has had updated booster    Plan and Recommendations:  - proceed to ED for evaluation and management of hyperglycemia, electrolyte imbalance and 1 week of persistent diarrhea and malaise   - hold asciminib during acute illness  - restart as soon as possible after improvement in symptoms and then continue asciminib 40 mg PO BID  - F/U BCR-ABL1 PCR results pending from today  - RTC in 3 months for clinic visit and BCR-ABL PCR monitoring      Dr. Vertell Limber was available     Lavonda Jumbo, AGNP  Arna Medici, AGNP-BC  Leukemia Research Nurse Practitioner  Hematology/Oncology Division  [facility]  10/27/2021    I personally spent 40 minutes face-to-face and non-face-to-face in the care of this patient, which includes all pre, intra, and post visit time on the date of service.  All documented time was specific to the E/M visit and does not include any procedures that may have been performed.    History of Present Illness:  Oncology History Overview Note   CML, chronic phase:  Leukocytosis to ~30 at time of diagnosis with normal platelet count and Hgb.     Bone marrow biopsy 06/26/2015:   CML, chronic phase (2% blasts, 2% promyelocytes)    Aspirate Smear:   The aspirate smear is hypercellular with rare small spicules.   Myeloid elements are increased with full maturation, without significant   cytologic atypia. Erythroid elements are markedly decreased, without significant cytologic atypia. The myeloid to erythroid ratio is >10:1. Many small hypolobated megakaryocytes are present. Storage iron is present; ringed sideroblasts are not identified.     A differential count of 200 nucleated cells shows the following   percentages:   2 Blasts   2 Promyelocytes   31 Myelocytes/Metamyelocytes   51 Bands/Neutrophils   3 Monocytes   3 Eosinophils   1 Basophils   1 Lymphocytes   0 Plasma cells   6 NRBCs     Biopsy:   H&E and PAS stained sections show unremarkable trabecular bone and markedly hypercellular marrow for age (95% cellularity). The myeloid to erythroid ratio is >5:1. Erythroid elements are decreased with full maturation. Myeloid elements are markedly increased with full maturation.   Megakaryocytes are increased with many small hypolobated forms.   Scattered small lymphocytes and plasma cells account for <5% of   cellularity. A reticulin stain shows grade 1 of 3 fibrosis (European Consensus System).     Flow cytometry:   Flow cytometry performed on the bone marrow aspirate ([id number])   demonstrates a non-discrete population of blasts and maturing   myelomonocytic cells within the blast gate (<2% of total events).     CBC:   A tandem CBC shows: WBC 27.9 (55.7% neutrophils, 0.9% bands, 0.9%   metamyelocytes, 11.3% myelocytes, 10.4% lymphocytes, 13.2% monocytes, 1.0%   eosinophils, 4.7% basophils), Hgb 13.4, MCV 87, Plt 224.     Treatment:   Imatinib, 400mg , 06/26/2015-05/17/2019  Bosutiib, 400 mg 10/04/2019 -     BCR/ABL testing:   06/2015: 87.086%  07/17/2015: 140.922%  08/18/2015: 75.693%  12/08/2015: 2.144%  01/07/2016: 2.450%  03/29/2016: 2.008%  07/26/2016: 2.871%; Kinase domain mutation testing negative  10/28/2016: 1.247%  01/05/2017: 0.641%  03/14/2017: 0.348%; Kinase domain mutation testing negative  06/25/19: Result not available   07/26/19: Result not available      07/26/19 FISH: positive t(9;22) in ~90.5% of cells     Final Diagnosis   Date Value Ref Range Status   08/20/2019   Final    Bone marrow, right iliac, aspiration and biopsy  -  Hypercellular bone marrow (95%) involved by chronic myeloid leukemia, BCR-ABL1-positive, chronic phase (2% blasts by manual aspirate differential)  -  See linked reports for associated Ancillary Studies.      This electronic signature is attestation that the pathologist personally reviewed the submitted material(s) and the final diagnosis reflects that evaluation.       08/12/19 BCR/ABL: 28.427 %  08/20/19 BCR/ABL: 35.897%  08/12/19 TKI Resistance Testing: Negative  10/04/19: started bosutinib 400 mg daily  07/03/20: BCR/ABL 68.2%, TKI resistance testing negative  11/11/20: BCR/ABL 74.4%, TKI resistance testing negative  12/09/20: started Asciminib 40 mg bid      Current treatment:   Asciminib 40 mg bid    05/27/21 BCR-ABL: pending     Chronic myeloid leukemia (CMS-HCC)   06/26/2015 Initial Diagnosis    Chronic myeloid leukemia (CMS-HCC)     06/26/2015 - 05/17/2019 Chemotherapy    Imatinib, 400mg  daily; discontinued due to worsening edema     10/04/2019 - 11/2020 Chemotherapy  Bosutinib 400mg      11/11/2020 Progression    Loss of hematologic response with neutrophilia, monocytosis, eosinophilia but not basophilia. Possibly due to non-adherence and association of LE edema with bosutinib as potential toxicity--lack of molecular response but no BCR-ABL point mutations     12/10/2020 -  Chemotherapy    Asciminib 40 mg PO BID     01/04/2021 Remission    Complete hematologic response         Interval History:  Mr. Colao reports feeling unwell today. He endorses constipation 1.5 weeks ago that was miserable but he did not treat. Last Tuesday and Wednesday he noted heartburn and took a few doses of tums. On Friday he had a single episode of vomiting. Then he started having frequent diarrhea that is still ongoing. He endorses several episodes of diarrheal incontinence as well. He initially had abdominal pain, but that has since resolved. He took 1 dose of imodium, but no other treatment of diarrhea. He denies fever or chills. Has had pain and pressure in his sinuses, and notes some dizziness/lightheadedness when getting up. He states he has been having a mild intermittent cough since Friday which has been non-productive. He reports very little food intake, did have a biscuit today for breakfast. States he has been drinking plenty of water. He has continued to take his insulin during this illness. He took lantus 40 units and novolog 26 units this morning. Held his asciminib on Sat, Sun, Mon, and Tues. Took asciminib this morning. He does note some slight intermittent chest achiness over the past several days. Overall, just not feeling well.       Otherwise, he denies new constitutional symptoms such as anorexia, weight loss, night sweats or unexplained fevers.  Furthermore, he denies symptoms of marrow failure: unexplained bleeding or bruising, recurrent or unexplained intercurrent infections, dyspnea on exertion, palpitations or chest pain.  There have been no new or unexplained pains or self-identified masses, swelling or enlarged lymph nodes.    Past Medical, Surgical and Family History were reviewed and pertinent updates were made in the Electronic Medical Record    Review of Systems:  Other than as reported above in the interim history, the balance of a full 12-system review was performed and unremarkable.    ECOG Performance Status: 0    Past Medical History:  Past Medical History:   Diagnosis Date    CKD (chronic kidney disease) stage 3, GFR 30-59 ml/min (CMS-HCC)     CML (chronic myelocytic leukemia) (CMS-HCC)     HFrEF (heart failure with reduced ejection fraction) (CMS-HCC)     Paroxysmal atrial fibrillation (CMS-HCC)     PFO (patent foramen ovale)     Right atrial thrombus     T2DM (type 2 diabetes mellitus) (CMS-HCC)        Medications:  Current Outpatient Medications   Medication Sig Dispense Refill    albuterol HFA 90 mcg/actuation inhaler Inhale 2 puffs every six (6) hours as needed.      apixaban (ELIQUIS) 5 mg Tab Take 1 tablet (5 mg total) by mouth Two (2) times a day. 60 tablet 12    asciminib (SCEMBLIX) 40 mg tablet Take 1 tablet (40 mg total) by mouth Two (2) times a day. Take on an empty stomach, at least 1 hour before or 2 hours after a meal. Swallow tablets whole. Do not break, crush, or chew the tablets. 60 tablet 11    BINAXNOW COVID-19 AG SELF TEST Kit  blood sugar diagnostic Strp       blood-glucose meter Misc       folic acid (FOLVITE) 1 MG tablet Take 1 tablet (1 mg total) by mouth in the morning. 90 tablet 3    HUMULIN R REGULAR U-100 INSULN 100 unit/mL injection INJECT 15 UNITS SUBCUTANEOUSLY THREE TIMES DAILY BEFORE MEAL(S)      isosorbide dinitrate (ISORDIL) 20 MG tablet Take 1 tablet (20 mg total) by mouth Three (3) times a day.      lancets Misc by Miscellaneous route.      lisinopriL (PRINIVIL,ZESTRIL) 5 MG tablet Take 1 tablet (5 mg total) by mouth daily.      metOLazone (ZAROXOLYN) 2.5 MG tablet Take 1 tablet (2.5 mg total) by mouth daily. 5 tablet 3    metoprolol succinate (TOPROL-XL) 100 MG 24 hr tablet Take 1 tablet (100 mg total) by mouth daily. 30 tablet 1    ONETOUCH VERIO TEST STRIPS Strp USE 1 STRIP TO CHECK GLUCOSE TWICE DAILY      OZEMPIC 0.25 mg or 0.5 mg(2 mg/1.5 mL) PnIj injection Inject 0.5 mg under the skin every seven (7) days.      pantoprazole (PROTONIX) 40 MG tablet Take 1 tablet (40 mg total) by mouth daily.      pregabalin (LYRICA) 100 MG capsule Take 1 capsule (100 mg total) by mouth Three (3) times a day.      rosuvastatin (CRESTOR) 20 MG tablet Take 1 tablet (20 mg total) by mouth daily. 90 tablet 0    simethicone (MYLICON) 80 MG chewable tablet Chew 1 tablet (80 mg total) every six (6) hours as needed for flatulence. 60 tablet 2    torsemide 40 mg Tab Take 1 tablet (40 mg total) by mouth two (2) times a day. 40 mg in the morning and 20 mg in the evening 180 tablet 3    insulin glargine (BASAGLAR, LANTUS) 100 unit/mL (3 mL) injection pen Inject 0.27 mL (27 Units total) under the skin nightly. 15 mL 12     No current facility-administered medications for this visit.       Vital Signs:  BSA: 2.47 meters squared  Vitals:    10/27/21 1051   BP: 104/73   Pulse: 81   Resp: 18   Temp: 36.2 ??C (97.1 ??F)   SpO2: 96%         Physical Exam:  General: appears unwell sitting on sofa, presents with girlfriend, in no apparent distress  HEENT:  Pupils are equal, round and reactive to light and accomodation.  There is no scleral icterus and no conjunctival injection.  The oral mucosa does not demonstrate ulceration, erythema, exudate or purpura.    Heart:  RRR, Systolic ejection murmur.  S1, S2, no R/G. Lower extremities without pitting edema.  Lungs:  Breathing is unlabored and patient is speaking full sentences with ease. CTAB without rales, ronchi or crackles.    Abdomen:  No distention or pain on palpation.  Bowel sounds are present.  No palpable masses.  Skin:  No rashes, petechiae or purpura.  Grossly intact.   Musculoskeletal:  Range of motion about the shoulder, elbow, hips and knees is grossly normal.  Strength is equal bilaterally.  Neurologic:  Alert and oriented x 4.      Relevant Laboratory, radiology and pathology results:  Admission on 10/27/2021, Discharged on 10/28/2021   Component Date Value Ref Range Status    Sodium 10/27/2021 129 (L)  135 - 145 mmol/L Final  Potassium 10/27/2021 4.9 (H)  3.4 - 4.8 mmol/L Final    Chloride 10/27/2021 95 (L)  98 - 107 mmol/L Final    CO2 10/27/2021 25.0  20.0 - 31.0 mmol/L Final    Anion Gap 10/27/2021 9  5 - 14 mmol/L Final    BUN 10/27/2021 29 (H)  9 - 23 mg/dL Final    Creatinine 96/29/5284 3.03 (H)  0.60 - 1.10 mg/dL Final    BUN/Creatinine Ratio 10/27/2021 10   Final    eGFR CKD-EPI (2021) Male 10/27/2021 24 (L)  >=60 mL/min/1.86m2 Final    eGFR calculated with CKD-EPI 2021 equation in accordance with [employer] and [employer] of Nephrology Task Force recommendations.    Glucose 10/27/2021 571 (HH)  70 - 179 mg/dL Final    Calcium 13/24/4010 9.7  8.7 - 10.4 mg/dL Final    Albumin 27/25/3664 3.5  3.4 - 5.0 g/dL Final    Total Protein 10/27/2021 7.5  5.7 - 8.2 g/dL Final    Total Bilirubin 10/27/2021 0.7  0.3 - 1.2 mg/dL Final    AST 40/34/7425 22  <=34 U/L Final    ALT 10/27/2021 16  10 - 49 U/L Final    Alkaline Phosphatase 10/27/2021 242 (H)  46 - 116 U/L Final    BETAHYDRO 10/27/2021 0.14  0.02 - 0.27 mmol/L Final    Color, UA 10/27/2021 Light Yellow   Final    Clarity, UA 10/27/2021 Clear   Final    Specific Gravity, UA 10/27/2021 1.018  1.003 - 1.030 Final    pH, UA 10/27/2021 6.0  5.0 - 9.0 Final    Leukocyte Esterase, UA 10/27/2021 Negative  Negative Final    Nitrite, UA 10/27/2021 Negative  Negative Final    Protein, UA 10/27/2021 100 mg/dL (A)  Negative Final    Glucose, UA 10/27/2021 >1000 mg/dL (A)  Negative Final    Ketones, UA 10/27/2021 Negative  Negative Final    Urobilinogen, UA 10/27/2021 <2.0 mg/dL  <9.5 mg/dL Final    Bilirubin, UA 10/27/2021 Negative  Negative Final    Blood, UA 10/27/2021 Negative  Negative Final    RBC, UA 10/27/2021 <1  <=3 /HPF Final    WBC, UA 10/27/2021 <1  <=2 /HPF Final    Squam Epithel, UA 10/27/2021 <1  0 - 5 /HPF Final    Bacteria, UA 10/27/2021 None Seen  None Seen /HPF Final    Hyaline Casts, UA 10/27/2021 1  0 - 1 /LPF Final    Mucus, UA 10/27/2021 Rare (A)  None Seen /HPF Final    WBC 10/27/2021 10.1  3.6 - 11.2 10*9/L Final    RBC 10/27/2021 5.91 (H)  4.26 - 5.60 10*12/L Final    HGB 10/27/2021 17.2 (H)  12.9 - 16.5 g/dL Final    HCT 63/87/5643 49.9 (H)  39.0 - 48.0 % Final    MCV 10/27/2021 84.4  77.6 - 95.7 fL Final    MCH 10/27/2021 29.1  25.9 - 32.4 pg Final    MCHC 10/27/2021 34.5  32.0 - 36.0 g/dL Final    RDW 32/95/1884 14.9  12.2 - 15.2 % Final    MPV 10/27/2021 9.1  6.8 - 10.7 fL Final    Platelet 10/27/2021 133 (L)  150 - 450 10*9/L Final    Neutrophils % 10/27/2021 67.4  % Final    Lymphocytes % 10/27/2021 21.6  % Final    Monocytes % 10/27/2021 9.1  % Final    Eosinophils %  10/27/2021 1.2  % Final    Basophils % 10/27/2021 0.7  % Final    Absolute Neutrophils 10/27/2021 6.8 1.8 - 7.8 10*9/L Final    Absolute Lymphocytes 10/27/2021 2.2  1.1 - 3.6 10*9/L Final    Absolute Monocytes 10/27/2021 0.9 (H)  0.3 - 0.8 10*9/L Final    Absolute Eosinophils 10/27/2021 0.1  0.0 - 0.5 10*9/L Final    Absolute Basophils 10/27/2021 0.1  0.0 - 0.1 10*9/L Final    Glucose, POC 10/27/2021 555 (HH)  70 - 179 mg/dL Final    EKG Ventricular Rate 10/27/2021 90  BPM Final    EKG Atrial Rate 10/27/2021 90  BPM Final    EKG P-R Interval 10/27/2021 162  ms Final    EKG QRS Duration 10/27/2021 168  ms Final    EKG Q-T Interval 10/27/2021 434  ms Final    EKG QTC Calculation 10/27/2021 530  ms Final    EKG Calculated P Axis 10/27/2021 53  degrees Final    EKG Calculated R Axis 10/27/2021 7  degrees Final    EKG Calculated T Axis 10/27/2021 22  degrees Final    QTC Fredericia 10/27/2021 496  ms Final    Glucose, POC 10/27/2021 466 (HH)  70 - 179 mg/dL Final    C. Diff Result 10/27/2021 Negative  Negative Final    Clostridium difficile NOT detected    Glucose, POC 10/27/2021 417 (HH)  70 - 179 mg/dL Final    Glucose, POC 81/19/1478 400 (H)  70 - 179 mg/dL Final    Glucose, POC 29/56/2130 304 (H)  70 - 179 mg/dL Final   Lab on 86/57/8469   Component Date Value Ref Range Status    Specimen Type 10/27/2021    Final                    Value:Blood    BCR::ABL1 p210 Assay 10/27/2021 Positive   Final    BCR::ABL1 p210 IS% Ratio 10/27/2021 1.052   Final    BCR/ABL1 p210 Assay Results 10/27/2021    Final                    Value:This result contains rich text formatting which cannot be displayed here.    Sodium 10/27/2021 130 (L)  135 - 145 mmol/L Final    Potassium 10/27/2021 4.2  3.4 - 4.8 mmol/L Final    Chloride 10/27/2021 94 (L)  98 - 107 mmol/L Final    CO2 10/27/2021 26.0  20.0 - 31.0 mmol/L Final    Anion Gap 10/27/2021 10  5 - 14 mmol/L Final    BUN 10/27/2021 31 (H)  9 - 23 mg/dL Final    Creatinine 62/95/2841 3.12 (H)  0.60 - 1.10 mg/dL Final    BUN/Creatinine Ratio 10/27/2021 10   Final    eGFR CKD-EPI (2021) Male 10/27/2021 23 (L)  >=60 mL/min/1.44m2 Final    eGFR calculated with CKD-EPI 2021 equation in accordance with [employer] and [employer] of Nephrology Task Force recommendations.    Glucose 10/27/2021 601 (HH)  70 - 179 mg/dL Final    Calcium 32/44/0102 9.8  8.7 - 10.4 mg/dL Final    Albumin 72/53/6644 3.3 (L)  3.4 - 5.0 g/dL Final    Total Protein 10/27/2021 7.2  5.7 - 8.2 g/dL Final    Total Bilirubin 10/27/2021 0.7  0.3 - 1.2 mg/dL Final    AST 03/47/4259 17  <=34 U/L Final  ALT 10/27/2021 16  10 - 49 U/L Final    Alkaline Phosphatase 10/27/2021 224 (H)  46 - 116 U/L Final    WBC 10/27/2021 9.1  3.6 - 11.2 10*9/L Final    RBC 10/27/2021 5.57  4.26 - 5.60 10*12/L Final    HGB 10/27/2021 16.2  12.9 - 16.5 g/dL Final    HCT 16/01/9603 47.2  39.0 - 48.0 % Final    MCV 10/27/2021 84.7  77.6 - 95.7 fL Final    MCH 10/27/2021 29.1  25.9 - 32.4 pg Final    MCHC 10/27/2021 34.3  32.0 - 36.0 g/dL Final    RDW 54/12/8117 15.1  12.2 - 15.2 % Final    MPV 10/27/2021 9.7  6.8 - 10.7 fL Final    Platelet 10/27/2021 141 (L)  150 - 450 10*9/L Final    Neutrophils % 10/27/2021 72.0  % Final    Lymphocytes % 10/27/2021 17.3  % Final    Monocytes % 10/27/2021 8.8  % Final    Eosinophils % 10/27/2021 1.1  % Final    Basophils % 10/27/2021 0.8  % Final    Absolute Neutrophils 10/27/2021 6.6  1.8 - 7.8 10*9/L Final    Absolute Lymphocytes 10/27/2021 1.6  1.1 - 3.6 10*9/L Final    Absolute Monocytes 10/27/2021 0.8  0.3 - 0.8 10*9/L Final    Absolute Eosinophils 10/27/2021 0.1  0.0 - 0.5 10*9/L Final    Absolute Basophils 10/27/2021 0.1  0.0 - 0.1 10*9/L Final

## 2021-10-27 NOTE — Unmapped (Signed)
Patient rounds completed. The following patient needs were addressed:  Patient continually sitting in the waiting room awaiting the room opening .

## 2021-10-28 MED ADMIN — insulin lispro (HumaLOG) injection 5 Units: 5 [IU] | SUBCUTANEOUS | Stop: 2021-10-27

## 2021-10-28 MED ADMIN — sodium chloride 0.9% (NS) bolus 1,000 mL: 1000 mL | INTRAVENOUS | Stop: 2021-10-27

## 2021-10-28 MED ADMIN — insulin lispro (HumaLOG) injection 5 Units: 5 [IU] | SUBCUTANEOUS | @ 05:00:00 | Stop: 2021-10-28

## 2021-10-28 MED ADMIN — insulin lispro (HumaLOG) injection 5 Units: 5 [IU] | SUBCUTANEOUS | @ 03:00:00 | Stop: 2021-10-27

## 2021-10-28 MED ADMIN — insulin glargine (LANTUS) injection 40 Units: 40 [IU] | SUBCUTANEOUS | @ 05:00:00 | Stop: 2021-10-28

## 2021-10-28 NOTE — Unmapped (Signed)
ED Progress Note    Received sign out from previous provider.    Patient Summary: Colin Hunter is a 51 y.o. 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  Labs notable for glucose of 571 with no evidence of DKA.  Patient received 2 L of normal saline as well as 5 units of lispro.  Action List:   Recheck blood glucose    Updates  ED Course as of 10/28/21 0116   Wed Oct 27, 2021   2201 Glucose, POC(!!): 417  Patient continues to have elevated glucose after 5 units of lispro and 2 L of normal saline.  We will plan to give him another 5 units of lispro.   2233 C. Diff Result: Negative   2347 Glucose, POC(!): 400  We will give him additional dose of 5 units of lispro.  Per chart review, patient takes 40 units of Lantus nightly.  We will give him his dose here.   Thu Oct 28, 2021   0116 Repeat glucose is 304.  Discussed with patient plan to discharge with close PCP follow-up for diabetes medication management.  Gave appropriate return precautions.  He understands and is agreeable with the plan.

## 2021-10-28 NOTE — Unmapped (Signed)
[facility] Refill Coordination Note    Specialty Medication(s) to be Shipped:   Hematology/Oncology: Scemblix    Other medication(s) to be shipped: No additional medications requested for fill at this time     Colin Hunter, DOB: Jan 24, 1971  Phone: [phone] (home)       All above HIPAA information was verified with patient.     Was a Nurse, learning disability used for this call? No    Completed refill call assessment today to schedule patient's medication shipment from the [facility] [phone]).  All relevant notes have been reviewed.     Specialty medication(s) and dose(s) confirmed: Regimen is correct and unchanged.   Changes to medications: Colin Hunter reports no changes at this time.  Changes to insurance: No  New side effects reported not previously addressed with a pharmacist or physician: None reported  Questions for the pharmacist: No    Confirmed patient received a Conservation officer, historic buildings and a Surveyor, mining with first shipment. The patient will receive a drug information handout for each medication shipped and additional FDA Medication Guides as required.       DISEASE/MEDICATION-SPECIFIC INFORMATION        N/A    SPECIALTY MEDICATION ADHERENCE     Medication Adherence    Patient reported X missed doses in the last month: 6  Specialty Medication: Scemblix 40 mg  Patient is on additional specialty medications: No  Informant: patient          Were doses missed due to medication being on hold? No    Scemblix 40 mg: 14 days of medicine on hand       REFERRAL TO PHARMACIST     Referral to the pharmacist: Not needed      [facility]     Shipping address confirmed in Epic.     Delivery Scheduled: Yes, Expected medication delivery date: 11/11/21.     Medication will be delivered via UPS to the prescription address in Epic [place].    Colin Hunter Colin Hunter   [facility] Specialty Technician

## 2021-10-28 NOTE — Unmapped (Signed)
[facility]  Emergency Department Provider Note     HPI, ED Course, Assessment and Plan      Colin Hunter is a 51 y.o. 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  Patient also states that he has been feeling under the weather over the last week, noting congestion, decreased appetite, and diarrhea.  Denies fever, chills.  Patient states he has had several episodes of watery diarrhea daily over a period of 1 week.  Patient denies any changes to his insulin regimen and reports taking approximately 45 units of Lantus and approximately 15 units of NovoLog before meals.  Patient reports compliance with this regimen.      BP 171/94  - Pulse 85  - Temp 36.9 ??C (98.4 ??F) (Oral)  - Resp 18  - SpO2 97%      On exam, patient is in no acute distress.  Mucous membranes are dry.  Abdomen soft, nontender.  No Kussmaul breathing.  Due to patient's history, exam patient is clinically dehydrated.  Low suspicion for DKA considering exam, no ketones, negative beta hydroxybutyrate.  Low suspicion for bacterial diarrhea given diarrhea is nonbloody and this is occurring in the setting of congestion, this may represent viral illness.Given immunocompromised state, will order Cdiff assay.    Diagnostic workup as below. Will treat patient with IVF and recheck blood sugar. Anticipate discharge home after improvement in glucose.     Orders Placed This Encounter   Procedures    Clostridium difficile Assay    Comprehensive Metabolic Panel    CBC w/ Differential    Beta Hydroxybutyrate    Urinalysis with Microscopy with Culture Reflex    POCT Glucose - RN Obtain    Glucose - POCT RN Obtain    POCT Glucose - RN Obtain    POCT Glucose - RN Obtain    ECG 12 Lead    Insert peripheral IV       ED Course as of 10/27/21 2342   Wed Oct 27, 2021   1821 Comprehensive Metabolic Panel(!!):    Sodium 366(!)   Potassium 4.9(!)   Chloride 95(!)   CO2 25.0   Anion Gap 9   Bun 29(!)   Creatinine 3.03(!)   BUN/Creatinine Ratio 10   eGFR CKD-EPI (2021) Male 24(!)   Glucose 571(!!)   Calcium 9.7   Albumin 3.5   Total Protein 7.5   Total Bilirubin 0.7   SGOT (AST) 22   ALT 16   Alkaline Phosphatase 242(!)  Corrected sodium level for hyperglycemia is 137.   2000 POCT Glucose(!!):    Glucose, POC 466(!!)  Repeat glucose 466, will order another fluid bolus of 5 units of insulin.     2115 - Signout given to oncoming team       Independent Interpretation of Studies: See ED course   External Records Reviewed: Prior notes reviewed       ED Clinical Impression     Final diagnoses:   Hyperglycemia (Primary)   Diarrhea, unspecified type        Physical Exam     Vitals:    10/27/21 1444 10/27/21 1637 10/27/21 2122 10/27/21 2300   BP: 100/74 100/65 181/86 171/94   Pulse: 84 80 87 85   Resp: 20 20 18 18    Temp: 36.9 ??C (98.4 ??F) 36.9 ??C (  98.4 ??F)     TempSrc: Oral Oral     SpO2: 100% 100% 98% 97%        Constitutional: In no acute distress.  Eyes: Conjunctivae are normal.  HEENT: Normocephalic and atraumatic. Mucous membranes are dry. No stridor.  Cardiovascular: See heart rate listed above. Normal skin perfusion. Normal and symmetric distal pulses are present in all extremities.  Respiratory: See respiratory rate listed above. Speaking easily in full sentences  Gastrointestinal: Soft, non-distended, non-tender.  No rebound or guarding.  Musculoskeletal: Nontender with normal range of motion in all extremities.   Neurologic: Normal speech and language. No gross focal neurologic deficits are appreciated.   Skin: Skin is warm, dry and intact.  Psychiatric: Mood and affect are normal.     Past History     PAST MEDICAL HISTORY/PAST SURGICAL HISTORY:   Past Medical History:   Diagnosis Date    CKD (chronic kidney disease) stage 3, GFR 30-59 ml/min (CMS-HCC)     CML (chronic myelocytic leukemia) (CMS-HCC)     HFrEF (heart failure with reduced ejection fraction) (CMS-HCC)     Paroxysmal atrial fibrillation (CMS-HCC)     PFO (patent foramen ovale)     Right atrial thrombus     T2DM (type 2 diabetes mellitus) (CMS-HCC)        Past Surgical History:   Procedure Laterality Date    PR COMPRE EP EVAL ABLTJ 3D MAPG TX SVT N/A 12/03/2020    Procedure: A-Flutter Ablation;  Surgeon: Dorcas Carrow, MD;  Location: [facility] EP;  Service: Cardiology    PR RIGHT HEART CATH O2 SATURATION & CARDIAC OUTPUT N/A 11/30/2020    Procedure: Right Heart Catheterization;  Surgeon: Marlaine Hind, MD;  Location: [facility] CATH;  Service: Cardiology       MEDICATIONS:   No current facility-administered medications for this encounter.    Current Outpatient Medications:     albuterol HFA 90 mcg/actuation inhaler, Inhale 2 puffs every six (6) hours as needed., Disp: , Rfl:     apixaban (ELIQUIS) 5 mg Tab, Take 1 tablet (5 mg total) by mouth Two (2) times a day., Disp: 60 tablet, Rfl: 12    asciminib (SCEMBLIX) 40 mg tablet, Take 1 tablet (40 mg total) by mouth Two (2) times a day. Take on an empty stomach, at least 1 hour before or 2 hours after a meal. Swallow tablets whole. Do not break, crush, or chew the tablets., Disp: 60 tablet, Rfl: 11    BINAXNOW COVID-19 AG SELF TEST Kit, , Disp: , Rfl:     blood sugar diagnostic Strp, , Disp: , Rfl:     blood-glucose meter Misc, , Disp: , Rfl:     folic acid (FOLVITE) 1 MG tablet, Take 1 tablet (1 mg total) by mouth in the morning., Disp: 90 tablet, Rfl: 3    HUMULIN R REGULAR U-100 INSULN 100 unit/mL injection, INJECT 15 UNITS SUBCUTANEOUSLY THREE TIMES DAILY BEFORE MEAL(S), Disp: , Rfl:     insulin glargine (BASAGLAR, LANTUS) 100 unit/mL (3 mL) injection pen, Inject 0.27 mL (27 Units total) under the skin nightly., Disp: 15 mL, Rfl: 12    isosorbide dinitrate (ISORDIL) 20 MG tablet, Take 1 tablet (20 mg total) by mouth Three (3) times a day., Disp: , Rfl:     lancets Misc, by Miscellaneous route., Disp: , Rfl:     lisinopriL (PRINIVIL,ZESTRIL) 5 MG tablet, Take 1 tablet (5 mg total) by mouth daily., Disp: , Rfl:  metOLazone (ZAROXOLYN) 2.5 MG tablet, Take 1 tablet (2.5 mg total) by mouth daily., Disp: 5 tablet, Rfl: 3    metoprolol succinate (TOPROL-XL) 100 MG 24 hr tablet, Take 1 tablet (100 mg total) by mouth daily., Disp: 30 tablet, Rfl: 1    ONETOUCH VERIO TEST STRIPS Strp, USE 1 STRIP TO CHECK GLUCOSE TWICE DAILY, Disp: , Rfl:     OZEMPIC 0.25 mg or 0.5 mg(2 mg/1.5 mL) PnIj injection, Inject 0.5 mg under the skin every seven (7) days., Disp: , Rfl:     pantoprazole (PROTONIX) 40 MG tablet, Take 1 tablet (40 mg total) by mouth daily., Disp: , Rfl:     pregabalin (LYRICA) 100 MG capsule, Take 1 capsule (100 mg total) by mouth Three (3) times a day., Disp: , Rfl:     rosuvastatin (CRESTOR) 20 MG tablet, Take 1 tablet (20 mg total) by mouth daily., Disp: 90 tablet, Rfl: 0    simethicone (MYLICON) 80 MG chewable tablet, Chew 1 tablet (80 mg total) every six (6) hours as needed for flatulence., Disp: 60 tablet, Rfl: 2    torsemide 40 mg Tab, Take 1 tablet (40 mg total) by mouth two (2) times a day. 40 mg in the morning and 20 mg in the evening, Disp: 180 tablet, Rfl: 3    ALLERGIES:   Patient has no known allergies.    SOCIAL HISTORY:   Social History     Tobacco Use    Smoking status: Former     Packs/day: 0.50     Years: 6.00     Pack years: 3.00     Types: Cigarettes     Quit date: 09/11/2018     Years since quitting: 3.1    Smokeless tobacco: Never   Substance Use Topics    Alcohol use: Never       FAMILY HISTORY:  Family History   Problem Relation Age of Onset    Heart disease Father          Radiology     No orders to display        Laboratory Data     Lab Results   Component Value Date    WBC 10.1 10/27/2021    HGB 17.2 (H) 10/27/2021    HCT 49.9 (H) 10/27/2021    PLT 133 (L) 10/27/2021       Lab Results   Component Value Date    NA 129 (L) 10/27/2021    K 4.9 (H) 10/27/2021    CL 95 (L) 10/27/2021    CO2 25.0 10/27/2021    BUN 29 (H) 10/27/2021    CREATININE 3.03 (H) 10/27/2021    GLU 571 (HH) 10/27/2021    CALCIUM 9.7 10/27/2021    MG 1.8 12/07/2020    PHOS 4.5 12/07/2020       Lab Results   Component Value Date    BILITOT 0.7 10/27/2021    BILIDIR <0.10 06/13/2020    PROT 7.5 10/27/2021    ALBUMIN 3.5 10/27/2021    ALT 16 10/27/2021    AST 22 10/27/2021    ALKPHOS 242 (H) 10/27/2021       Lab Results   Component Value Date    INR 1.14 11/12/2020    APTT 83.3 (H) 12/04/2020       Portions of this record have been created using [employer]. Dictation errors have been sought, but may not have been identified and corrected.  Corinna Lines, MD  Resident  10/27/21 [phone]

## 2021-11-10 MED FILL — SCEMBLIX 40 MG TABLET: ORAL | 30 days supply | Qty: 60 | Fill #11

## 2021-11-18 NOTE — Unmapped (Signed)
1st attempt. Called re: callback/TPVR. No answer. Nondescript VM greeting. No message left. Will attempt again at a later time.

## 2021-11-19 NOTE — Unmapped (Signed)
2nd attempt. Called patient re: TPVR. Patient states he's  been his normal state of health with dizziness and lightheadedness, but no shortness of breath. These are not new symptoms for hims. He is still able to work and thinks the increased temperatures may also be affecting his symptoms. No recent ECG. Was recently seen at the ED for hyperglycemia in the 600's, but most recent FBS = 180 per pt report. He saw his PCP one week ago. No weight gain concerns with last weight being 259. Last visit with hem onc was on 7/19.     Informed patient that we're still working on getting him scheduled for the Harmony valve; we're waiting from some more information from [employer]. Patient verbalized understanding. Will notify patient with updated information when it becomes available. All questions answered. All concerns addressed.

## 2021-11-30 DIAGNOSIS — C9210 Chronic myeloid leukemia, BCR/ABL-positive, not having achieved remission: Principal | ICD-10-CM

## 2021-11-30 MED ORDER — ASCIMINIB 40 MG TABLET
ORAL_TABLET | Freq: Two times a day (BID) | ORAL | 11 refills | 30 days | Status: CP
Start: 2021-11-30 — End: ?
  Filled 2022-02-03: qty 60, 30d supply, fill #0

## 2021-11-30 NOTE — Unmapped (Signed)
[facility] Refill Coordination Note    Specialty Medication(s) to be Shipped:   Hematology/Oncology: Scemblix  ((DENIED))    Other medication(s) to be shipped: No additional medications requested for fill at this time     Colin Hunter, DOB: 04/18/1970  Phone: [phone] (home)       All above HIPAA information was verified with patient.     Was a Nurse, learning disability used for this call? No    Completed refill call assessment today to schedule patient's medication shipment from the [facility] [phone]).  All relevant notes have been reviewed.     Specialty medication(s) and dose(s) confirmed: Regimen is correct and unchanged.   Changes to medications: Colin Hunter reports no changes at this time.  Changes to insurance: No  New side effects reported not previously addressed with a pharmacist or physician: None reported  Questions for the pharmacist: No    Confirmed patient received a Conservation officer, historic buildings and a Surveyor, mining with first shipment. The patient will receive a drug information handout for each medication shipped and additional FDA Medication Guides as required.       DISEASE/MEDICATION-SPECIFIC INFORMATION        N/A    SPECIALTY MEDICATION ADHERENCE     Medication Adherence    Patient reported X missed doses in the last month: 0  Specialty Medication: Scemblix 40 mg  Patient is on additional specialty medications: No  Informant: patient                       Were doses missed due to medication being on hold? No    Scemblix 40 mg: 29 days of medicine on hand       REFERRAL TO PHARMACIST     Referral to the pharmacist: Not needed      [facility]     Shipping address confirmed in Epic.     Delivery Scheduled: Patient declined refill at this time due to has 29 days left of medication.     Medication will be delivered via UPS to the prescription address in Epic [place].    [name] M [name]   [facility] Specialty Technician

## 2021-12-01 NOTE — Unmapped (Signed)
Called patient back from voicemail. Patient had complained of shakiness, dizziness. No SOB, chest pain, palpitations. Blood sugar was 180. Wasn't able to check blood pressure. Currently taking lasix as prescribed. At time of this note, patient states his symptoms have resolved overall. Stressed the importance of following up at local ED if symptoms return/worsen due to patient's medical history. Patient verbalized understanding.   Also informed patient that the [facility] interventional cardiologists have decided to proceed with the Harmony valve implant. Once there is another candidate for the valve, he will be scheduled. Patient agreed. All questions answered. All concerns addressed.

## 2021-12-27 NOTE — Unmapped (Signed)
The [facility] has made a second and final attempt to reach this patient to refill the following medication:Scemblix.      We have left voicemail with Wife at the following phone numbers: [phone] and have been unable to leave messages on the following phone numbers: [phone] .    Dates contacted: 9/12,18  Last scheduled delivery: 11/10/21    The patient may be at risk of non-compliance with this medication. The patient should call the [facility] at [phone]  Option 4, then Option 1 (oncology) to refill medication.    Colin Hunter Colin Hunter   [facility] Specialty Technician

## 2022-01-07 NOTE — Unmapped (Signed)
The [facility] has made a third attempt to reach this patient to refill the following medication:Scemblix.      We have left voicemails on the following phone numbers: [phone] , have left voicemail with wife at the following phone numbers: [phone] , and have been unable to leave messages on the following phone numbers: [phone] .    Dates contacted: 12/21/21; 12/27/21, 01/07/22  Last scheduled delivery: 11/10/21    The patient may be at risk of non-compliance with this medication. The patient should call the [facility] at [phone]  Option 4, then Option 1 (oncology) to refill medication.    Kermit Balo, [facility]   [facility] Specialty Pharmacist

## 2022-01-27 ENCOUNTER — Encounter
Admit: 2022-01-27 | Discharge: 2022-01-27 | Payer: MEDICARE | Attending: Hematology & Oncology | Primary: Hematology & Oncology

## 2022-01-27 ENCOUNTER — Ambulatory Visit: Admit: 2022-01-27 | Discharge: 2022-01-27 | Payer: MEDICARE

## 2022-01-27 ENCOUNTER — Other Ambulatory Visit: Admit: 2022-01-27 | Discharge: 2022-01-27 | Payer: MEDICARE

## 2022-01-27 DIAGNOSIS — C9210 Chronic myeloid leukemia, BCR/ABL-positive, not having achieved remission: Principal | ICD-10-CM

## 2022-01-27 LAB — CBC W/ AUTO DIFF
BASOPHILS ABSOLUTE COUNT: 0.1 10*9/L (ref 0.0–0.1)
BASOPHILS RELATIVE PERCENT: 1.1 %
EOSINOPHILS ABSOLUTE COUNT: 0.2 10*9/L (ref 0.0–0.5)
EOSINOPHILS RELATIVE PERCENT: 2.2 %
HEMATOCRIT: 44.7 % (ref 39.0–48.0)
HEMOGLOBIN: 15.1 g/dL (ref 12.9–16.5)
LYMPHOCYTES ABSOLUTE COUNT: 1.8 10*9/L (ref 1.1–3.6)
LYMPHOCYTES RELATIVE PERCENT: 18.8 %
MEAN CORPUSCULAR HEMOGLOBIN CONC: 33.8 g/dL (ref 32.0–36.0)
MEAN CORPUSCULAR HEMOGLOBIN: 30.1 pg (ref 25.9–32.4)
MEAN CORPUSCULAR VOLUME: 89 fL (ref 77.6–95.7)
MEAN PLATELET VOLUME: 9.4 fL (ref 6.8–10.7)
MONOCYTES ABSOLUTE COUNT: 0.5 10*9/L (ref 0.3–0.8)
MONOCYTES RELATIVE PERCENT: 5.3 %
NEUTROPHILS ABSOLUTE COUNT: 7.1 10*9/L (ref 1.8–7.8)
NEUTROPHILS RELATIVE PERCENT: 72.6 %
PLATELET COUNT: 135 10*9/L — ABNORMAL LOW (ref 150–450)
RED BLOOD CELL COUNT: 5.02 10*12/L (ref 4.26–5.60)
RED CELL DISTRIBUTION WIDTH: 14.4 % (ref 12.2–15.2)
WBC ADJUSTED: 9.8 10*9/L (ref 3.6–11.2)

## 2022-01-27 LAB — COMPREHENSIVE METABOLIC PANEL
ALBUMIN: 3.1 g/dL — ABNORMAL LOW (ref 3.4–5.0)
ALBUMIN: 3.2 g/dL — ABNORMAL LOW (ref 3.4–5.0)
ALKALINE PHOSPHATASE: 523 U/L — ABNORMAL HIGH (ref 46–116)
ALKALINE PHOSPHATASE: 528 U/L — ABNORMAL HIGH (ref 46–116)
ALT (SGPT): 65 U/L — ABNORMAL HIGH (ref 10–49)
ALT (SGPT): 64 U/L — ABNORMAL HIGH (ref 10–49)
ANION GAP: 8 mmol/L (ref 5–14)
ANION GAP: 8 mmol/L (ref 5–14)
AST (SGOT): 51 U/L — ABNORMAL HIGH (ref <=34)
BILIRUBIN TOTAL: 0.3 mg/dL (ref 0.3–1.2)
BILIRUBIN TOTAL: 0.3 mg/dL (ref 0.3–1.2)
BLOOD UREA NITROGEN: 43 mg/dL — ABNORMAL HIGH (ref 9–23)
BLOOD UREA NITROGEN: 45 mg/dL — ABNORMAL HIGH (ref 9–23)
BUN / CREAT RATIO: 17
BUN / CREAT RATIO: 20
CALCIUM: 8.9 mg/dL (ref 8.7–10.4)
CALCIUM: 9.2 mg/dL (ref 8.7–10.4)
CHLORIDE: 108 mmol/L — ABNORMAL HIGH (ref 98–107)
CHLORIDE: 109 mmol/L — ABNORMAL HIGH (ref 98–107)
CO2: 19 mmol/L — ABNORMAL LOW (ref 20.0–31.0)
CO2: 18 mmol/L — ABNORMAL LOW (ref 20.0–31.0)
CREATININE: 2.48 mg/dL — ABNORMAL HIGH
CREATININE: 2.26 mg/dL — ABNORMAL HIGH
EGFR CKD-EPI (2021) MALE: 31 mL/min/{1.73_m2} — ABNORMAL LOW (ref >=60)
EGFR CKD-EPI (2021) MALE: 34 mL/min/{1.73_m2} — ABNORMAL LOW (ref >=60)
GLUCOSE RANDOM: 443 mg/dL (ref 70–179)
GLUCOSE RANDOM: 484 mg/dL (ref 70–179)
POTASSIUM: 5.9 mmol/L — ABNORMAL HIGH (ref 3.4–4.8)
PROTEIN TOTAL: 8 g/dL (ref 5.7–8.2)
PROTEIN TOTAL: 8.1 g/dL (ref 5.7–8.2)
SODIUM: 135 mmol/L (ref 135–145)
SODIUM: 135 mmol/L (ref 135–145)

## 2022-01-27 MED ADMIN — furosemide (LASIX) injection 20 mg: 20 mg | INTRAVENOUS | @ 21:00:00 | Stop: 2022-01-27

## 2022-01-27 MED ADMIN — insulin lispro (HumaLOG) injection 12 Units: 12 [IU] | SUBCUTANEOUS | @ 20:00:00 | Stop: 2022-01-27

## 2022-01-27 MED ADMIN — insulin lispro (HumaLOG) injection 15 Units: 15 [IU] | SUBCUTANEOUS | @ 22:00:00 | Stop: 2022-01-27

## 2022-01-27 MED ADMIN — sodium chloride 0.9% (NS) bolus 500 mL: 500 mL | INTRAVENOUS | @ 20:00:00 | Stop: 2022-01-27

## 2022-01-27 NOTE — Unmapped (Signed)
[facility] Follow-up    Patient Name: Colin Hunter  Patient Age: 51 y.o.  Encounter Date: 01/27/2022    Primary Care Provider:  [facility]    Referring Physician:  Rudi Coco, AGNP  [address]  [place],  [place] [address]    Reason for visit: CML follow up    Assessment:  Colin Hunter is a 51 y.o. 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 Given the cardiac and metabolic toxicities of nilotinib, ponatinib and dasatinib, bosutinib was tried as a 2nd line agent, which did not result in molecular response, though drug adherence may have been an issue early in his course. He has been on asciminib 40 mg BID since 12/07/2020 and has had complete hematologic response. He presents today for follow up.    Colin Hunter presents to clinic today feeling okay.  He states he is tolerating treatment with asciminib well without toxicities or complications.  Unfortunately he has had recent hospitalization locally for cellulitis of his perineum.  He has completed all antibiotics for his admission and has been seen by his PCP.  In terms of his CML he has had a CMR since starting asciminib.  He states he did miss a few doses while he was admitted, but otherwise reports compliance.  BCR-ABL pending today, this will be the first assessment since starting asciminib.  As long as BCR-ABL down trending we will plan to follow every 3 months with follow up and BCR-ABL PCR monitoring.      T2DM:  Noted to have serum glucose of 443 in clinic today with other electrolyte abnormalities.  Did not take morning 26U of insulin prior to cinic visit this morning and ate breakfast.  Will plan for 1 time dose of insulin and IVF bolus in infusion.      Cardiac Issues:  HFrEF (EF 30-35%): follow up with Dr. Derinda Sis at [facility]. If he fails asciminib, consultation with cardio-oncology would be reasonable before considering alternate agents that have known cardiac toxicity.  - Right atrial thrombus: Currently on Eliquis 5mg  BID  - Cardioversion 5/23 due to [facility] with atrial flutter    CKD Stage 3: Baseline creatinine ~2-3. Likely secondary to diabetic and hypertensive nephropathy. Cr today is 2.48       Plan and Recommendations:  - continue asciminib 40mg  BID  - plan for 15U lispro insulin x 1  - 1L NS IVF bolus  - follow up BCR-ABL PCR from today  - RTC in 3 months for clinic visit and BCR-ABL PCR monitoring      Dr. Vertell Limber was available     Arna Medici, AGNP-BC  Leukemia Research Nurse Practitioner  Hematology/Oncology Division  [facility]  01/27/2022    I personally spent 60 minutes face-to-face and non-face-to-face in the care of this patient, which includes all pre, intra, and post visit time on the date of service.  All documented time was specific to the E/M visit and does not include any procedures that may have been performed.    History of Present Illness:  Oncology History Overview Note   CML, chronic phase:   Leukocytosis to ~30 at time of diagnosis with normal platelet count and Hgb.     Bone marrow biopsy 06/26/2015:   CML, chronic phase (2% blasts, 2% promyelocytes)    Aspirate Smear:   The aspirate  smear is hypercellular with rare small spicules.   Myeloid elements are increased with full maturation, without significant   cytologic atypia. Erythroid elements are markedly decreased, without significant cytologic atypia. The myeloid to erythroid ratio is >10:1. Many small hypolobated megakaryocytes are present. Storage iron is present; ringed sideroblasts are not identified.     A differential count of 200 nucleated cells shows the following   percentages:   2 Blasts   2 Promyelocytes   31 Myelocytes/Metamyelocytes   51 Bands/Neutrophils   3 Monocytes   3 Eosinophils   1 Basophils   1 Lymphocytes   0 Plasma cells   6 NRBCs     Biopsy:   H&E and PAS stained sections show unremarkable trabecular bone and markedly hypercellular marrow for age (95% cellularity). The myeloid to erythroid ratio is >5:1. Erythroid elements are decreased with full maturation. Myeloid elements are markedly increased with full maturation.   Megakaryocytes are increased with many small hypolobated forms.   Scattered small lymphocytes and plasma cells account for <5% of   cellularity. A reticulin stain shows grade 1 of 3 fibrosis (European Consensus System).     Flow cytometry:   Flow cytometry performed on the bone marrow aspirate ([id number])   demonstrates a non-discrete population of blasts and maturing   myelomonocytic cells within the blast gate (<2% of total events).     CBC:   A tandem CBC shows: WBC 27.9 (55.7% neutrophils, 0.9% bands, 0.9%   metamyelocytes, 11.3% myelocytes, 10.4% lymphocytes, 13.2% monocytes, 1.0%   eosinophils, 4.7% basophils), Hgb 13.4, MCV 87, Plt 224.     Treatment:   Imatinib, 400mg , 06/26/2015-05/17/2019  Bosutiib, 400 mg 10/04/2019 -     BCR/ABL testing:   06/2015: 87.086%  07/17/2015: 140.922%  08/18/2015: 75.693%  12/08/2015: 2.144%  01/07/2016: 2.450%  03/29/2016: 2.008%  07/26/2016: 2.871%; Kinase domain mutation testing negative  10/28/2016: 1.247%  01/05/2017: 0.641%  03/14/2017: 0.348%; Kinase domain mutation testing negative  06/25/19: Result not available   07/26/19: Result not available      07/26/19 FISH: positive t(9;22) in ~90.5% of cells     Final Diagnosis   Date Value Ref Range Status   08/20/2019   Final    Bone marrow, right iliac, aspiration and biopsy  -  Hypercellular bone marrow (95%) involved by chronic myeloid leukemia, BCR-ABL1-positive, chronic phase (2% blasts by manual aspirate differential)  -  See linked reports for associated Ancillary Studies.      This electronic signature is attestation that the pathologist personally reviewed the submitted material(s) and the final diagnosis reflects that evaluation.       08/12/19 BCR/ABL: 28.427 %  08/20/19 BCR/ABL: 35.897%  08/12/19 TKI Resistance Testing: Negative  10/04/19: started bosutinib 400 mg daily  07/03/20: BCR/ABL 68.2%, TKI resistance testing negative  11/11/20: BCR/ABL 74.4%, TKI resistance testing negative  12/09/20: started Asciminib 40 mg bid      Current treatment:   Asciminib 40 mg bid    05/27/21 BCR-ABL: 4.0%    10/2021: 1.1%     Chronic myeloid leukemia (CMS-HCC)   06/26/2015 Initial Diagnosis    Chronic myeloid leukemia (CMS-HCC)     06/26/2015 - 05/17/2019 Chemotherapy    Imatinib, 400mg  daily; discontinued due to worsening edema     10/04/2019 - 11/2020 Chemotherapy    Bosutinib 400mg      11/11/2020 Progression    Loss of hematologic response with neutrophilia, monocytosis, eosinophilia but not basophilia. Possibly due to non-adherence and association of LE edema with  bosutinib as potential toxicity--lack of molecular response but no BCR-ABL point mutations     12/10/2020 -  Chemotherapy    Asciminib 40 mg PO BID     01/04/2021 Remission    Complete hematologic response         Interval History:  Since last seen Colin Hunter states that he is feeling better.  He was recently admitted for perineal cellulitis and has completed all abx since discharge.  Today he presents with serum BG of >400.  He reports that he takes 40U of Lantus insulin at night and took it last night, but he is also supposed to take 26U of SSI with meals.  He did not take that this morning and he ate breakfast on the way to his appointment.  He denies any n/v/d.  He reports a good appetite.  Denies any sob/cough.      Otherwise, he denies new constitutional symptoms such as anorexia, weight loss, night sweats or unexplained fevers.  Furthermore, he denies symptoms of marrow failure: unexplained bleeding or bruising, recurrent or unexplained intercurrent infections, dyspnea on exertion, palpitations or chest pain.  There have been no new or unexplained pains or self-identified masses, swelling or enlarged lymph nodes.    Past Medical, Surgical and Family History were reviewed and pertinent updates were made in the Electronic Medical Record    Review of Systems:  Other than as reported above in the interim history, the balance of a full 12-system review was performed and unremarkable.    ECOG Performance Status: 0    Past Medical History:  Past Medical History:   Diagnosis Date    CKD (chronic kidney disease) stage 3, GFR 30-59 ml/min (CMS-HCC)     CML (chronic myelocytic leukemia) (CMS-HCC)     HFrEF (heart failure with reduced ejection fraction) (CMS-HCC)     Paroxysmal atrial fibrillation (CMS-HCC)     PFO (patent foramen ovale)     Right atrial thrombus     T2DM (type 2 diabetes mellitus) (CMS-HCC)        Medications:  Current Outpatient Medications   Medication Sig Dispense Refill    apixaban (ELIQUIS) 5 mg Tab Take 1 tablet (5 mg total) by mouth Two (2) times a day. 60 tablet 12    asciminib (SCEMBLIX) 40 mg tablet Take 1 tablet (40 mg total) by mouth Two (2) times a day. Take on an empty stomach, at least 1 hour before or 2 hours after a meal. Swallow tablets whole. Do not break, crush, or chew the tablets. 60 tablet 11    BINAXNOW COVID-19 AG SELF TEST Kit       blood sugar diagnostic Strp       blood-glucose meter Misc       folic acid (FOLVITE) 1 MG tablet Take 1 tablet (1 mg total) by mouth in the morning. 90 tablet 3    HUMULIN R REGULAR U-100 INSULN 100 unit/mL injection INJECT 15 UNITS SUBCUTANEOUSLY THREE TIMES DAILY BEFORE MEAL(S)      lancets Misc by Miscellaneous route.      lisinopriL (PRINIVIL,ZESTRIL) 5 MG tablet Take 1 tablet (5 mg total) by mouth daily.      ONETOUCH VERIO TEST STRIPS Strp USE 1 STRIP TO CHECK GLUCOSE TWICE DAILY      OZEMPIC 0.25 mg or 0.5 mg(2 mg/1.5 mL) PnIj injection Inject 0.5 mg under the skin every seven (7) days.      pregabalin (LYRICA) 100 MG capsule Take 1 capsule (100 mg  total) by mouth Three (3) times a day.      rosuvastatin (CRESTOR) 20 MG tablet Take 1 tablet (20 mg total) by mouth daily. 90 tablet 0    simethicone (MYLICON) 80 MG chewable tablet Chew 1 tablet (80 mg total) every six (6) hours as needed for flatulence. 60 tablet 2    torsemide 40 mg Tab Take 1 tablet (40 mg total) by mouth two (2) times a day. 40 mg in the morning and 20 mg in the evening 180 tablet 3    albuterol HFA 90 mcg/actuation inhaler Inhale 2 puffs every six (6) hours as needed. (Patient not taking: Reported on 01/27/2022)      insulin glargine (BASAGLAR, LANTUS) 100 unit/mL (3 mL) injection pen Inject 0.27 mL (27 Units total) under the skin nightly. 15 mL 12    isosorbide dinitrate (ISORDIL) 20 MG tablet Take 1 tablet (20 mg total) by mouth Three (3) times a day. (Patient not taking: Reported on 01/27/2022)      metOLazone (ZAROXOLYN) 2.5 MG tablet Take 1 tablet (2.5 mg total) by mouth daily. (Patient not taking: Reported on 01/27/2022) 5 tablet 3    metoprolol succinate (TOPROL-XL) 100 MG 24 hr tablet Take 1 tablet (100 mg total) by mouth daily. (Patient not taking: Reported on 01/27/2022) 30 tablet 1    pantoprazole (PROTONIX) 40 MG tablet Take 1 tablet (40 mg total) by mouth daily. (Patient not taking: Reported on 01/27/2022)       No current facility-administered medications for this visit.       Vital Signs:  BSA: 2.53 meters squared  Vitals:    01/27/22 1224   BP: 155/90   Pulse: 72   Resp: 16   Temp: 36.8 ??C (98.2 ??F)   SpO2: 100%         Physical Exam:  General:  resting, in no apparent distress  HEENT:  Pupils are equal, round and reactive to light and accomodation.  There is no scleral icterus and no conjunctival injection.      Heart:  RRR, Systolic ejection murmur.  S1, S2, no R/G. Lower extremities without pitting edema.  Lungs:  Breathing is unlabored and patient is speaking full sentences with ease. CTAB without rales, ronchi or crackles.    Abdomen:  No distention or pain on palpation.  Bowel sounds are present.  No palpable masses.  Skin:  No rashes, petechiae or purpura.  Grossly intact.   Musculoskeletal: Range of motion about the shoulder, elbow, hips and knees is grossly normal.  Strength is equal bilaterally.  Neurologic:  Alert and oriented x 4.  Stable gait    Relevant Laboratory, radiology and pathology results:  Appointment on 01/27/2022   Component Date Value Ref Range Status    Sodium 01/27/2022 135  135 - 145 mmol/L Final    Potassium 01/27/2022 5.9 (H)  3.4 - 4.8 mmol/L Final    Chloride 01/27/2022 108 (H)  98 - 107 mmol/L Final    CO2 01/27/2022 19.0 (L)  20.0 - 31.0 mmol/L Final    Anion Gap 01/27/2022 8  5 - 14 mmol/L Final    BUN 01/27/2022 43 (H)  9 - 23 mg/dL Final    Creatinine 84/69/6295 2.48 (H)  0.60 - 1.10 mg/dL Final    BUN/Creatinine Ratio 01/27/2022 17   Final    eGFR CKD-EPI (2021) Male 01/27/2022 31 (L)  >=60 mL/min/1.6m2 Final    eGFR calculated with CKD-EPI 2021 equation in accordance with [employer]  and [employer] of Nephrology Task Force recommendations.    Glucose 01/27/2022 443 (HH)  70 - 179 mg/dL Final    Calcium 04/54/0981 8.9  8.7 - 10.4 mg/dL Final    Albumin 19/14/7829 3.1 (L)  3.4 - 5.0 g/dL Final    Total Protein 01/27/2022 8.0  5.7 - 8.2 g/dL Final    Total Bilirubin 01/27/2022 0.3  0.3 - 1.2 mg/dL Final    AST 56/21/3086 51 (H)  <=34 U/L Final    ALT 01/27/2022 65 (H)  10 - 49 U/L Final    Alkaline Phosphatase 01/27/2022 523 (H)  46 - 116 U/L Final    WBC 01/27/2022 9.8  3.6 - 11.2 10*9/L Final    RBC 01/27/2022 5.02  4.26 - 5.60 10*12/L Final    HGB 01/27/2022 15.1  12.9 - 16.5 g/dL Final    HCT 57/84/6962 44.7  39.0 - 48.0 % Final    MCV 01/27/2022 89.0  77.6 - 95.7 fL Final    MCH 01/27/2022 30.1  25.9 - 32.4 pg Final    MCHC 01/27/2022 33.8  32.0 - 36.0 g/dL Final    RDW 95/28/4132 14.4  12.2 - 15.2 % Final    MPV 01/27/2022 9.4  6.8 - 10.7 fL Final    Platelet 01/27/2022 135 (L)  150 - 450 10*9/L Final    Neutrophils % 01/27/2022 72.6  % Final    Lymphocytes % 01/27/2022 18.8  % Final    Monocytes % 01/27/2022 5.3  % Final    Eosinophils % 01/27/2022 2.2  % Final    Basophils % 01/27/2022 1.1  % Final    Absolute Neutrophils 01/27/2022 7.1  1.8 - 7.8 10*9/L Final    Absolute Lymphocytes 01/27/2022 1.8  1.1 - 3.6 10*9/L Final    Absolute Monocytes 01/27/2022 0.5  0.3 - 0.8 10*9/L Final    Absolute Eosinophils 01/27/2022 0.2  0.0 - 0.5 10*9/L Final    Absolute Basophils 01/27/2022 0.1  0.0 - 0.1 10*9/L Final    Glucose, POC 01/27/2022 395 (H)  70 - 179 mg/dL Final

## 2022-01-27 NOTE — Unmapped (Signed)
Pt. Arrived to bed 45 in Oncology Infusion for tx of hyperglycemia. IVF and insulin orders completed per APP Tolley. Glucose POC was rechecked per orders and resulted at 315. APP Oliver Hum made aware and approved pt. for discharged. Pt. was re-educated on s/s of hyper/hypoglycemia and when to seek emergency care, patient verbalized understanding. pt. was discharged with no additional needs after PIV was removed, AVS declined.

## 2022-01-27 NOTE — Unmapped (Signed)
Error

## 2022-01-28 NOTE — Unmapped (Signed)
Hospital Outpatient Visit on 01/27/2022   Component Date Value Ref Range Status    Glucose, POC 01/27/2022 448 (HH)  70 - 179 mg/dL Final    Glucose, POC 44/06/4740 419 (HH)  70 - 179 mg/dL Final    Glucose, POC 59/56/3875 315 (H)  70 - 179 mg/dL Final   Office Visit on 01/27/2022   Component Date Value Ref Range Status    Sodium 01/27/2022 135  135 - 145 mmol/L Final    Potassium 01/27/2022    Final    Hemolyzed specimen.        Chloride 01/27/2022 109 (H)  98 - 107 mmol/L Final    CO2 01/27/2022 18.0 (L)  20.0 - 31.0 mmol/L Final    Anion Gap 01/27/2022 8  5 - 14 mmol/L Final    BUN 01/27/2022 45 (H)  9 - 23 mg/dL Final    Creatinine 64/33/2951 2.26 (H)  0.60 - 1.10 mg/dL Final    BUN/Creatinine Ratio 01/27/2022 20   Final    eGFR CKD-EPI (2021) Male 01/27/2022 34 (L)  >=60 mL/min/1.74m2 Final    eGFR calculated with CKD-EPI 2021 equation in accordance with [employer] and [employer] of Nephrology Task Force recommendations.    Glucose 01/27/2022 484 (HH)  70 - 179 mg/dL Final    Calcium 88/41/6606 9.2  8.7 - 10.4 mg/dL Final    Albumin 30/16/0109 3.2 (L)  3.4 - 5.0 g/dL Final    Total Protein 01/27/2022 8.1  5.7 - 8.2 g/dL Final    Total Bilirubin 01/27/2022 0.3  0.3 - 1.2 mg/dL Final    AST 32/35/5732    Final    Hemolyzed specimen.        ALT 01/27/2022 64 (H)  10 - 49 U/L Final    Alkaline Phosphatase 01/27/2022 528 (H)  46 - 116 U/L Final   Lab on 01/27/2022   Component Date Value Ref Range Status    Sodium 01/27/2022 135  135 - 145 mmol/L Final    Potassium 01/27/2022 5.9 (H)  3.4 - 4.8 mmol/L Final    Chloride 01/27/2022 108 (H)  98 - 107 mmol/L Final    CO2 01/27/2022 19.0 (L)  20.0 - 31.0 mmol/L Final    Anion Gap 01/27/2022 8  5 - 14 mmol/L Final    BUN 01/27/2022 43 (H)  9 - 23 mg/dL Final    Creatinine 20/25/4270 2.48 (H)  0.60 - 1.10 mg/dL Final    BUN/Creatinine Ratio 01/27/2022 17   Final    eGFR CKD-EPI (2021) Male 01/27/2022 31 (L)  >=60 mL/min/1.91m2 Final    eGFR calculated with CKD-EPI 2021 equation in accordance with [employer] and [employer] of Nephrology Task Force recommendations.    Glucose 01/27/2022 443 (HH)  70 - 179 mg/dL Final    Calcium 62/37/6283 8.9  8.7 - 10.4 mg/dL Final    Albumin 15/17/6160 3.1 (L)  3.4 - 5.0 g/dL Final    Total Protein 01/27/2022 8.0  5.7 - 8.2 g/dL Final    Total Bilirubin 01/27/2022 0.3  0.3 - 1.2 mg/dL Final    AST 73/71/0626 51 (H)  <=34 U/L Final    ALT 01/27/2022 65 (H)  10 - 49 U/L Final    Alkaline Phosphatase 01/27/2022 523 (H)  46 - 116 U/L Final    WBC 01/27/2022 9.8  3.6 - 11.2 10*9/L Final    RBC 01/27/2022 5.02  4.26 - 5.60 10*12/L Final    HGB  01/27/2022 15.1  12.9 - 16.5 g/dL Final    HCT 82/95/6213 44.7  39.0 - 48.0 % Final    MCV 01/27/2022 89.0  77.6 - 95.7 fL Final    MCH 01/27/2022 30.1  25.9 - 32.4 pg Final    MCHC 01/27/2022 33.8  32.0 - 36.0 g/dL Final    RDW 08/65/7846 14.4  12.2 - 15.2 % Final    MPV 01/27/2022 9.4  6.8 - 10.7 fL Final    Platelet 01/27/2022 135 (L)  150 - 450 10*9/L Final    Neutrophils % 01/27/2022 72.6  % Final    Lymphocytes % 01/27/2022 18.8  % Final    Monocytes % 01/27/2022 5.3  % Final    Eosinophils % 01/27/2022 2.2  % Final    Basophils % 01/27/2022 1.1  % Final    Absolute Neutrophils 01/27/2022 7.1  1.8 - 7.8 10*9/L Final    Absolute Lymphocytes 01/27/2022 1.8  1.1 - 3.6 10*9/L Final    Absolute Monocytes 01/27/2022 0.5  0.3 - 0.8 10*9/L Final    Absolute Eosinophils 01/27/2022 0.2  0.0 - 0.5 10*9/L Final    Absolute Basophils 01/27/2022 0.1  0.0 - 0.1 10*9/L Final    Glucose, POC 01/27/2022 395 (H)  70 - 179 mg/dL Final

## 2022-01-31 NOTE — Unmapped (Signed)
[facility] Clinical Assessment & Refill Coordination Note    Colin Hunter, DOB: 1970-05-21  Phone: [phone] (home)     All above HIPAA information was verified with patient.     Was a Nurse, learning disability used for this call? No    Specialty Medication(s):   Hematology/Oncology: Scemblix     Current Outpatient Medications   Medication Sig Dispense Refill    albuterol HFA 90 mcg/actuation inhaler Inhale 2 puffs every six (6) hours as needed. (Patient not taking: Reported on 01/27/2022)      apixaban (ELIQUIS) 5 mg Tab Take 1 tablet (5 mg total) by mouth Two (2) times a day. 60 tablet 12    asciminib (SCEMBLIX) 40 mg tablet Take 1 tablet (40 mg total) by mouth Two (2) times a day. Take on an empty stomach, at least 1 hour before or 2 hours after a meal. Swallow tablets whole. Do not break, crush, or chew the tablets. 60 tablet 11    BINAXNOW COVID-19 AG SELF TEST Kit       blood sugar diagnostic Strp       blood-glucose meter Misc       folic acid (FOLVITE) 1 MG tablet Take 1 tablet (1 mg total) by mouth in the morning. 90 tablet 3    HUMULIN R REGULAR U-100 INSULN 100 unit/mL injection INJECT 15 UNITS SUBCUTANEOUSLY THREE TIMES DAILY BEFORE MEAL(S)      insulin glargine (BASAGLAR, LANTUS) 100 unit/mL (3 mL) injection pen Inject 0.27 mL (27 Units total) under the skin nightly. 15 mL 12    isosorbide dinitrate (ISORDIL) 20 MG tablet Take 1 tablet (20 mg total) by mouth Three (3) times a day. (Patient not taking: Reported on 01/27/2022)      lancets Misc by Miscellaneous route.      lisinopriL (PRINIVIL,ZESTRIL) 5 MG tablet Take 1 tablet (5 mg total) by mouth daily.      metOLazone (ZAROXOLYN) 2.5 MG tablet Take 1 tablet (2.5 mg total) by mouth daily. (Patient not taking: Reported on 01/27/2022) 5 tablet 3    metoprolol succinate (TOPROL-XL) 100 MG 24 hr tablet Take 1 tablet (100 mg total) by mouth daily. (Patient not taking: Reported on 01/27/2022) 30 tablet 1    ONETOUCH VERIO TEST STRIPS Strp USE 1 STRIP TO CHECK GLUCOSE TWICE DAILY      OZEMPIC 0.25 mg or 0.5 mg(2 mg/1.5 mL) PnIj injection Inject 0.5 mg under the skin every seven (7) days.      pantoprazole (PROTONIX) 40 MG tablet Take 1 tablet (40 mg total) by mouth daily. (Patient not taking: Reported on 01/27/2022)      pregabalin (LYRICA) 100 MG capsule Take 1 capsule (100 mg total) by mouth Three (3) times a day.      rosuvastatin (CRESTOR) 20 MG tablet Take 1 tablet (20 mg total) by mouth daily. 90 tablet 0    simethicone (MYLICON) 80 MG chewable tablet Chew 1 tablet (80 mg total) every six (6) hours as needed for flatulence. 60 tablet 2    torsemide 40 mg Tab Take 1 tablet (40 mg total) by mouth two (2) times a day. 40 mg in the morning and 20 mg in the evening 180 tablet 3     No current facility-administered medications for this visit.        Changes to medications: Colin Hunter reports no changes at this time.    No Known Allergies    Changes to allergies: No  SPECIALTY MEDICATION ADHERENCE     Scemblix 40 mg: 8 days of medicine on hand     Medication Adherence    Patient reported X missed doses in the last month: 4  Specialty Medication: Scemblix 40 mg  Patient is on additional specialty medications: No  Informant: patient   Other non-adherence reason: patient was admitted               Confirmed plan for next specialty medication refill: not applicable  Refills needed for supportive medications: not needed          Specialty medication(s) dose(s) confirmed: Regimen is correct and unchanged.     Are there any concerns with adherence?  Colin Hunter reports missing 4 days due to being admitted    Adherence counseling provided?  No    CLINICAL MANAGEMENT AND INTERVENTION      Clinical Benefit Assessment:    Do you feel the medicine is effective or helping your condition? Yes    Clinical Benefit counseling provided? Not needed    Adverse Effects Assessment:    Are you experiencing any side effects? No    Are you experiencing difficulty administering your medicine? No    Quality of Life Assessment:    Quality of Life    Rheumatology  Oncology  1. What impact has your specialty medication had on the reduction of your daily pain or discomfort level?: None  2. On a scale of 1-10, how would you rate your ability to manage side effects associated with your specialty medication? (1=no issues, 10 = unable to take medication due to side effects): 1  Dermatology  Cystic Fibrosis          How many days over the past month did your CML  keep you from your normal activities? For example, brushing your teeth or getting up in the morning. 0    Have you discussed this with your provider? Not needed    Acute Infection Status:    Acute infections noted within Epic:  No active infections  Patient reported infection: None    Therapy Appropriateness:    Is therapy appropriate and patient progressing towards therapeutic goals? Yes, therapy is appropriate and should be continued    DISEASE/MEDICATION-SPECIFIC INFORMATION      N/A    Oncology: Is the patient receiving adequate infection prevention treatment? Not applicable  Does the patient have adequate nutritional support? Not applicable    PATIENT SPECIFIC NEEDS     Does the patient have any physical, cognitive, or cultural barriers? No    Is the patient high risk? No    Did the patient require a clinical intervention? No    Does the patient require physician intervention or other additional services (i.e., nutrition, smoking cessation, social work)? No    SOCIAL DETERMINANTS OF HEALTH     At the [facility], we have learned that life circumstances - like trouble affording food, housing, utilities, or transportation can affect the health of many of our patients.   That is why we wanted to ask: are you currently experiencing any life circumstances that are negatively impacting your health and/or quality of life? Patient declined to answer    Social Determinants of Health     Financial Resource Strain: Low Risk  (03/18/2021) Overall Financial Resource Strain (CARDIA)     Difficulty of Paying Living Expenses: Not hard at all   Internet Connectivity: Not on file   Food Insecurity: No Food Insecurity (03/18/2021)  Hunger Vital Sign     Worried About Running Out of Food in the Last Year: Never true     Ran Out of Food in the Last Year: Never true   Tobacco Use: Medium Risk (10/27/2021)    Patient History     Smoking Tobacco Use: Former     Smokeless Tobacco Use: Never     Passive Exposure: Not on file   Housing/Utilities: Low Risk  (03/18/2021)    Housing/Utilities     Within the past 12 months, have you ever stayed: outside, in a car, in a tent, in an overnight shelter, or temporarily in someone else's home (i.e. couch-surfing)?: No     Are you worried about losing your housing?: No     Within the past 12 months, have you been unable to get utilities (heat, electricity) when it was really needed?: No   Alcohol Use: Not At Risk (12/31/2020)    Alcohol Use     How often do you have a drink containing alcohol?: Never     How many drinks containing alcohol do you have on a typical day when you are drinking?: 1 - 2     How often do you have 5 or more drinks on one occasion?: Never   Transportation Needs: No Transportation Needs (03/18/2021)    PRAPARE - Transportation     Lack of Transportation (Medical): No     Lack of Transportation (Non-Medical): No   Substance Use: Not on file   Health Literacy: Medium Risk (03/18/2021)    Health Literacy     : Rarely   Physical Activity: Not on file   Interpersonal Safety: Not on file   Stress: Not on file   Intimate Partner Violence: Not on file   Depression: Not at risk (12/31/2020)    PHQ-2     PHQ-2 Score: 0   Social Connections: Not on file     Would you be willing to receive help with any of the needs that you have identified today? Not applicable     SHIPPING     Specialty Medication(s) to be Shipped:   Hematology/Oncology: Scemblix    Other medication(s) to be shipped: No additional medications requested for fill at this time     Changes to insurance: No    Delivery Scheduled: Yes, Expected medication delivery date: 02/04/22.     Medication will be delivered via UPS to the confirmed prescription address in [facility].    The patient will receive a drug information handout for each medication shipped and additional FDA Medication Guides as required.  Verified that patient has previously received a Conservation officer, historic buildings and a Surveyor, mining.    The patient or caregiver noted above participated in the development of this care plan and knows that they can request review of or adjustments to the care plan at any time.      All of the patient's questions and concerns have been addressed.    Kermit Balo, [facility]   [facility] Specialty Pharmacist

## 2022-02-11 ENCOUNTER — Ambulatory Visit: Admit: 2022-02-11 | Discharge: 2022-02-12 | Payer: MEDICARE

## 2022-02-11 DIAGNOSIS — I480 Paroxysmal atrial fibrillation: Principal | ICD-10-CM

## 2022-02-11 DIAGNOSIS — Z794 Long term (current) use of insulin: Principal | ICD-10-CM

## 2022-02-11 DIAGNOSIS — Q221 Congenital pulmonary valve stenosis: Principal | ICD-10-CM

## 2022-02-11 DIAGNOSIS — E119 Type 2 diabetes mellitus without complications: Principal | ICD-10-CM

## 2022-02-11 NOTE — Unmapped (Signed)
Addended by: Bailey Mech on: 02/11/2022 01:20 PM     Modules accepted: Orders

## 2022-02-11 NOTE — Unmapped (Signed)
You were seen in the Adult Congenital Heart Clinic today at [facility] at [facility].    TODAY:  -  A external ECG monitor (Ziopatch) was placed to monitor your heart rhythm. Follow-directions to mail back in 7 days.  - I placed an order for a referral to an endocrinologist to better managed your diabetes.   - I ordered a blood pressure cuff. Take your blood pressure daily.   - Your valve is currently scheduled or Thursday, 03/24/22. Make sure you have all your dental work completed before then.     NEXT APPOINTMENT:  Return to clinic in 2 weeks with Colin Mech, NP.     Call the clinic at [phone] with questions.  Our clinic fax number is [phone].  If you need to reschedule future appointments, please call [phone] or [phone]    Our [facility] nurse and patient coordinator, Dorcas Carrow, can be reached at is [phone] if you need further assistance.    After office hours, if you have urgent questions/problems, contact the on-call cardiologist through the hospital operator: [phone].

## 2022-02-11 NOTE — Unmapped (Signed)
ecACHD Intake Questionnaire     Since his last visit, Colin Hunter has had an increase of: chest discomfort/pain and dizziness  Pain is from accident  Dizziness- Couple of times a week, happens randomly, lasts for 45 minutes  Exercise: No   Energy level:fair  Recent changes in weight:No    Sexual Health  Sexually active: Yes   Sex drive:fair   Contraceptive type: None    Routine Care  PCP: Center, [facility]  Dentist:no dentist    Last cleaning: no recent cleanings     Mental Health Screening  PHQ-2:0  GAD-2: 0    Vaccination History  COVID vaccine: Fully vaccinated and boosted  Flu vaccine:yes

## 2022-02-11 NOTE — Unmapped (Signed)
Called patient's daughter per request from most recent admit's case Production designer, theatre/television/film. Verified that she was under patient's contacts. I reviewed patient's cardiac history and plan for his TPVR. Colin Mulders is unaware if the patient received cardiac evaluation during his admission besides an echo. In order to close some gaps in communication, Colin Mulders agreed to an appointment with me and Simmie Davies, MD. Message to schedulers sent. All questions answered. All concerns addressed.

## 2022-02-11 NOTE — Unmapped (Signed)
DIVISION OF CARDIOLOGY  [facility] [place]        Date of Service: 02/11/2022      PCP: Referring Provider:   Center, [facility]  [phone] Attn: Medical Records  [place] [address]  Phone: [phone]  Fax: [phone] Center, [facility]  [address]  Attn: Medical Records  [place],  [place] [address]  Phone: [phone]  Fax:      ______________________________________________________________________________________________      ASSESSMENT AND PLAN:       1. 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   - he was lost to follow up and presented with syncope and volume overload.  He has had aflutter ablation.  He had been doing well, but is now volume overloaded again and is back in Flutter.  - he is in the best shape he's been since Ozempic and aggressive diuresis.  - will schedule for Harmony valve.    2.  Aflutter  - s/p Aflutter ablation.  He is anticoagulated.      3.  CHF - non-ischemic.  Per patient, he had a cardiac cath at [facility] that did not show significant CAD.  His LVEF is 55%, but his right ventricle is dilated and has low normal systolic function.    4.  PFO - from previous echos.  No history of stroke.        [name], MD,  [facility], [facility]  Interventional Cardiology  Associate Professor of Medicine  [place] [facility] at [facility]    ______________________________________________________________________________________________      SUBJECTIVE:     HISTORY OF PRESENT ILLNESS:    Dear physician from [facility],      I had the pleasure of seeing Colin Hunter in our Adult Congenital Heart Disease (ACHD) Clinic today for follow up referred to [place] by Dr. Berline Lopes in regards to Colin Hunter' history of congenital pulmonary stenosis s/p surgical valvotomy as a child.      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  He also has DM, CKD and HFrEF with last LVEF of 45-50% (reported) and is in Aflutter at the time of the visit.      Overall, Colin Hunter state that he has been doing better lately.  He was diagnosed with CML back in 2018 and was started on Gleevec at the time.  He became volume overloaded and experienced quite a bit of orthopnea which led to him being admitted to [facility] about 6 times since September of 2020.  It seems that he has been on a better HF regimen/diuresis since March of 2021 and has not been hospitalized since then.  Overall, he appears well today.  He had an echocardiogram that was read as an EF of 45-50% with moderate PI.  His PI is severe, and has severely dilated RV and RV dysfunction.     Colin Hunter has been in Atrial Flutter intermittently with volume overload, requiring cardioversion.  He has severe PI.  He currently is in the best shape that we have seen him.  He has minimal lower extremity swelling and has lost a lot of  weight on Ozempic.  - He has had Harmony CT and is a reasonable candidate for Harmony valve.  Will proceed with intervention 04/2022.    He reports no lightheadedness, dizziness, palpitations, or syncope.  Patient also denies any overt heart failure symptoms such as orthopnea, paroxysmal nocturnal dyspnea, or worsening lower extremity edema.      Colin Hunter states compliance with his medications and denies any untoward side effects from it.      He works for [employer].       CARDIOVASCULAR HISTORY AND PROCEDURES    Cardiovascular Studies Date Comments     ECG 2021 Aflutter with variable block at 102, RBBB   Echo 2021 EF of 45-50% / Severe RV dilatation and decreased function.  Moderate PI with PASP of 45 mm Hg.   Stress test 2021 Cardiac catheterization 2021    Electrophysiology      Cardiovascular Surgery 1975 Surgical pulmonary valvotomy   Peripheral Vascular Studies         I have reviewed the cardiology tests personally.      PAST MEDICAL HISTORY  Past Medical History:   Diagnosis Date    CKD (chronic kidney disease) stage 3, GFR 30-59 ml/min (CMS-HCC)     CML (chronic myelocytic leukemia) (CMS-HCC)     HFrEF (heart failure with reduced ejection fraction) (CMS-HCC)     Paroxysmal atrial fibrillation (CMS-HCC)     PFO (patent foramen ovale)     Right atrial thrombus     T2DM (type 2 diabetes mellitus) (CMS-HCC)        ALLERGIES  Patient has no known allergies.      CURRENT MEDICATIONS  Current Outpatient Medications   Medication Sig Dispense Refill    albuterol HFA 90 mcg/actuation inhaler Inhale 2 puffs every six (6) hours as needed. (Patient not taking: Reported on 01/27/2022)      apixaban (ELIQUIS) 5 mg Tab Take 1 tablet (5 mg total) by mouth Two (2) times a day. 60 tablet 12    asciminib (SCEMBLIX) 40 mg tablet Take 1 tablet (40 mg total) by mouth Two (2) times a day. Take on an empty stomach, at least 1 hour before or 2 hours after a meal. Swallow tablets whole. Do not break, crush, or chew the tablets. 60 tablet 11    BINAXNOW COVID-19 AG SELF TEST Kit       blood sugar diagnostic Strp       blood-glucose meter Misc       folic acid (FOLVITE) 1 MG tablet Take 1 tablet (1 mg total) by mouth in the morning. 90 tablet 3    HUMULIN R REGULAR U-100 INSULN 100 unit/mL injection INJECT 15 UNITS SUBCUTANEOUSLY THREE TIMES DAILY BEFORE MEAL(S)      insulin glargine (BASAGLAR, LANTUS) 100 unit/mL (3 mL) injection pen Inject 0.27 mL (27 Units total) under the skin nightly. 15 mL 12    isosorbide dinitrate (ISORDIL) 20 MG tablet Take 1 tablet (20 mg total) by mouth Three (3) times a day. (Patient not taking: Reported on 01/27/2022)      lancets Misc by Miscellaneous route.      lisinopriL (PRINIVIL,ZESTRIL) 5 MG tablet Take 1 tablet (5 mg total) by mouth daily.      metOLazone (ZAROXOLYN) 2.5 MG tablet Take 1 tablet (2.5 mg total) by mouth daily. (Patient not taking: Reported on 01/27/2022) 5 tablet 3    metoprolol succinate (TOPROL-XL) 100 MG 24 hr tablet Take 1 tablet (100 mg total) by mouth  daily. (Patient not taking: Reported on 01/27/2022) 30 tablet 1    ONETOUCH VERIO TEST STRIPS Strp USE 1 STRIP TO CHECK GLUCOSE TWICE DAILY      OZEMPIC 0.25 mg or 0.5 mg(2 mg/1.5 mL) PnIj injection Inject 0.5 mg under the skin every seven (7) days.      pantoprazole (PROTONIX) 40 MG tablet Take 1 tablet (40 mg total) by mouth daily. (Patient not taking: Reported on 01/27/2022)      pregabalin (LYRICA) 100 MG capsule Take 1 capsule (100 mg total) by mouth Three (3) times a day.      rosuvastatin (CRESTOR) 20 MG tablet Take 1 tablet (20 mg total) by mouth daily. 90 tablet 0    simethicone (MYLICON) 80 MG chewable tablet Chew 1 tablet (80 mg total) every six (6) hours as needed for flatulence. 60 tablet 2    torsemide 40 mg Tab Take 1 tablet (40 mg total) by mouth two (2) times a day. 40 mg in the morning and 20 mg in the evening 180 tablet 3     No current facility-administered medications for this visit.       FAMILY HISTORY  Negative for early CAD    SOCIAL HISTORY  He  reports that he quit smoking about 3 years ago. His smoking use included cigarettes. He has a 3.00 pack-year smoking history. He has never used smokeless tobacco. He reports that he does not drink alcohol and does not use drugs.      REVIEW OF SYSTEMS    Review of Systems - 10 systems were reviewed and negative except as noted in HPI    Constitutional: negative for - chills, fatigue, fever or night sweats  ENT ROS: negative for - vertigo or visual changes  Hematological and Lymphatic ROS: negative for - blood transfusions, jaundice, night sweats or swollen lymph nodes  Endocrine ROS: negative for - skin changes or temperature intolerance  Respiratory ROS: no cough, shortness of breath, or wheezing negative for - hemoptysis, orthopnea, shortness of breath, tachypnea or wheezing  Cardiovascular ROS:  As reported in HPI  Gastrointestinal ROS: negative for - abdominal pain, hematemesis, melena, nausea/vomiting or swallowing difficulty/pain  Genito-Urinary ROS: negative for - dysuria, incontinence or nocturia  Musculoskeletal ROS: negative for - gait disturbance, joint pain, muscle pain or muscular weakness  Neurological ROS: negative for - behavioral changes, bowel and bladder control changes, headaches, seizures or speech problems    PHYSICAL EXAM     Physical Exam  There were no vitals taken for this visit.   Wt Readings from Last 3 Encounters:   01/27/22 (!) 122.7 kg (270 lb 8.1 oz)   10/27/21 (!) 116.7 kg (257 lb 4.4 oz)   07/16/21 (!) 125.6 kg (277 lb)       General:  Alert, no distress.   Eyes:  Intact, sclerae anicteric.   Ears, nose, mouth: Moist mucous membranes.Supple, no carotid bruit.    Respiratory:   CTAB bilaterally with normal WOB.   Cardiovascular:  No carotid bruit, no JVD, RRR 2/6 systolic murmur at RUSB   Gastrointestinal:   Normal bowel sounds, soft, NTND.   Musculoskeletal: Normal strength   Skin: Warm, well perfused.   Neurologic: No focal deficits.       Most recent labs   Lab Results   Component Value Date    Sodium 135 01/27/2022    Potassium  01/27/2022      Comment:      Hemolyzed specimen.  Chloride 109 (H) 01/27/2022    CO2 18.0 (L) 01/27/2022    BUN 45 (H) 01/27/2022    Creatinine 2.26 (H) 01/27/2022    Magnesium 1.8 12/07/2020     Lab Results   Component Value Date    HGB 15.1 01/27/2022    Hemoglobin, POC 12.5 (L) 11/30/2020    MCV 89.0 01/27/2022    Platelet 135 (L) 01/27/2022     Lab Results   Component Value Date    HGB A1C, POC 9.9 (H) 12/31/2020    TSH 3.009 11/24/2020    PRO-BNP 2,260.0 (H) 08/26/2019    INR 1.14 11/12/2020         *Patient note was created using dragon dictation. Any errors in syntax or proofreading may not have been identified and edited on initial review prior to signing this note.    Thank you very much for allowing me the opportunity to participate in the care of Colin Hunter, who is a delightful patient.  Please do not hesitate to call me if you have any questions.      Scribe Attestation:         This document serves as a record of the services and decisions performed by [name], MD, [facility], [facility] on 02/11/2022. It was created on his behalf by Belva Agee, a trained medical scribe. The creation of this document is based on the provider's statements and observations that were conveyed to the medical scribe during the patient's encounter.     (The information in this document, created by the medical scribe for me, accurately reflects the services I personally performed and the decisions made by me. I have reviewed and approved this document for accuracy.)     [name], MD,  [facility], [facility]  Interventional Cardiology  Associate Professor of Medicine  [place] [facility] [place] [facility]

## 2022-02-28 NOTE — Unmapped (Signed)
Patient scheduled for Harmony valve on 05/05/22.

## 2022-03-17 NOTE — Unmapped (Addendum)
[facility] Refill Coordination Note    Specialty Medication(s) to be Shipped:   Hematology/Oncology: Scemblix    Other medication(s) to be shipped: No additional medications requested for fill at this time     Colin Hunter, DOB: 1970/12/16  Phone: [phone] (home)       All above HIPAA information was verified with patient.     Was a Nurse, learning disability used for this call? No    Completed refill call assessment today to schedule patient's medication shipment from the [facility] [phone]).  All relevant notes have been reviewed.     Specialty medication(s) and dose(s) confirmed: Regimen is correct and unchanged.   Changes to medications: Colin Hunter reports no changes at this time.  Changes to insurance: No  New side effects reported not previously addressed with a pharmacist or physician: None reported  Questions for the pharmacist: No    Confirmed patient received a Conservation officer, historic buildings and a Surveyor, mining with first shipment. The patient will receive a drug information handout for each medication shipped and additional FDA Medication Guides as required.       DISEASE/MEDICATION-SPECIFIC INFORMATION        N/A    SPECIALTY MEDICATION ADHERENCE     Medication Adherence    Patient reported X missed doses in the last month: 0  Specialty Medication: Scemblix 40 mg  Patient is on additional specialty medications: No  Informant: patient                  Confirmed plan for next specialty medication refill: delivery by pharmacy  Refills needed for supportive medications: not needed          Were doses missed due to medication being on hold? No    Scemblix 40 mg: 4 days of medicine on hand       REFERRAL TO PHARMACIST     Referral to the pharmacist: Not needed      [facility]     Shipping address confirmed in Epic.     Delivery Scheduled: Yes, Expected medication delivery date: 03/19/22.     Medication will be delivered via UPS to the temporary address in Epic [place].    Colin Hunter, PharmD   [facility] Specialty Pharmacist

## 2022-03-18 MED FILL — SCEMBLIX 40 MG TABLET: ORAL | 30 days supply | Qty: 60 | Fill #1

## 2022-04-08 NOTE — Unmapped (Signed)
Received call from patient to update his phone number. He also informed me he's completed the first part of his dental work; second cleaning is scheduled for 01/08. Plan to complete TPVR on 01/25. All questions answered. All concerns addressed.

## 2022-04-14 NOTE — Unmapped (Signed)
[facility] Refill Coordination Note    Specialty Medication(s) to be Shipped:   Hematology/Oncology: Scemblix    Other medication(s) to be shipped: No additional medications requested for fill at this time     Colin Hunter, DOB: 1971-01-03  Phone: There are no phone numbers on file.      All above HIPAA information was verified with patient.     Was a Nurse, learning disability used for this call? No    Completed refill call assessment today to schedule patient's medication shipment from the [facility] [phone]).  All relevant notes have been reviewed.     Specialty medication(s) and dose(s) confirmed: Regimen is correct and unchanged.   Changes to medications: Colin Hunter reports stopping the following medications: Lisinopril and Lasix  Changes to insurance: No  New side effects reported not previously addressed with a pharmacist or physician: None reported  Questions for the pharmacist: No    Confirmed patient received a Conservation officer, historic buildings and a Surveyor, mining with first shipment. The patient will receive a drug information handout for each medication shipped and additional FDA Medication Guides as required.       DISEASE/MEDICATION-SPECIFIC INFORMATION        N/A    SPECIALTY MEDICATION ADHERENCE     Medication Adherence    Patient reported X missed doses in the last month: 0  Specialty Medication: Scemblix 40 mg  Patient is on additional specialty medications: No  Informant: patient                       Were doses missed due to medication being on hold? No    Scemblix 40 mg: 6 days of medicine on hand       REFERRAL TO PHARMACIST     Referral to the pharmacist: Not needed      [facility]     Shipping address confirmed in Epic.     Delivery Scheduled: Yes, Expected medication delivery date: 04/19/22.     Medication will be delivered via UPS to the prescription address in Epic [place].    Colin Hunter   [facility] Specialty Technician

## 2022-04-18 MED FILL — SCEMBLIX 40 MG TABLET: ORAL | 30 days supply | Qty: 60 | Fill #2

## 2022-05-05 ENCOUNTER — Ambulatory Visit: Admit: 2022-05-05 | Discharge: 2022-05-12 | Payer: MEDICARE

## 2022-05-05 ENCOUNTER — Encounter: Admit: 2022-05-05 | Payer: MEDICARE | Attending: Anesthesiology

## 2022-05-05 ENCOUNTER — Ambulatory Visit: Admit: 2022-05-05 | Payer: MEDICARE

## 2022-05-05 ENCOUNTER — Ambulatory Visit
Admit: 2022-05-05 | Discharge: 2022-05-12 | Disposition: A | Discharge status: Home / Self Care | Payer: MEDICARE | Admitting: Clinical Cardiac Electrophysiology

## 2022-05-05 ENCOUNTER — Encounter: Admit: 2022-05-05 | Payer: MEDICARE | Attending: Pediatrics | Primary: Pediatrics

## 2022-05-05 DIAGNOSIS — I371 Nonrheumatic pulmonary valve insufficiency: Principal | ICD-10-CM

## 2022-05-05 LAB — COMPREHENSIVE METABOLIC PANEL
ALBUMIN: 3 g/dL — ABNORMAL LOW (ref 3.4–5.0)
ALKALINE PHOSPHATASE: 248 U/L — ABNORMAL HIGH (ref 46–116)
ALT (SGPT): 54 U/L — ABNORMAL HIGH (ref 10–49)
ANION GAP: 8 mmol/L (ref 5–14)
AST (SGOT): 34 U/L (ref <=34)
BILIRUBIN TOTAL: 0.5 mg/dL (ref 0.3–1.2)
BLOOD UREA NITROGEN: 49 mg/dL — ABNORMAL HIGH (ref 9–23)
BUN / CREAT RATIO: 14
CALCIUM: 8.8 mg/dL (ref 8.7–10.4)
CHLORIDE: 101 mmol/L (ref 98–107)
CO2: 21 mmol/L (ref 20.0–31.0)
CREATININE: 3.43 mg/dL — ABNORMAL HIGH
EGFR CKD-EPI (2021) MALE: 21 mL/min/{1.73_m2} — ABNORMAL LOW (ref >=60)
GLUCOSE RANDOM: 507 mg/dL (ref 70–99)
POTASSIUM: 5.1 mmol/L — ABNORMAL HIGH (ref 3.4–4.8)
PROTEIN TOTAL: 5.9 g/dL (ref 5.7–8.2)
SODIUM: 130 mmol/L — ABNORMAL LOW (ref 135–145)

## 2022-05-05 LAB — CBC
HEMATOCRIT: 46.3 % (ref 39.0–48.0)
HEMOGLOBIN: 16 g/dL (ref 12.9–16.5)
MEAN CORPUSCULAR HEMOGLOBIN CONC: 34.5 g/dL (ref 32.0–36.0)
MEAN CORPUSCULAR HEMOGLOBIN: 29.7 pg (ref 25.9–32.4)
MEAN CORPUSCULAR VOLUME: 86.3 fL (ref 77.6–95.7)
MEAN PLATELET VOLUME: 9.5 fL (ref 6.8–10.7)
PLATELET COUNT: 129 10*9/L — ABNORMAL LOW (ref 150–450)
RED BLOOD CELL COUNT: 5.37 10*12/L (ref 4.26–5.60)
RED CELL DISTRIBUTION WIDTH: 14.1 % (ref 12.2–15.2)
WBC ADJUSTED: 8.5 10*9/L (ref 3.6–11.2)

## 2022-05-05 MED ADMIN — phenylephrine 0.8 mg/10 mL (80 mcg/mL) injection: INTRAVENOUS | @ 14:00:00 | Stop: 2022-05-05

## 2022-05-05 MED ADMIN — Propofol (DIPRIVAN) injection: INTRAVENOUS | @ 17:00:00 | Stop: 2022-05-05

## 2022-05-05 MED ADMIN — phenylephrine 0.8 mg/10 mL (80 mcg/mL) injection: INTRAVENOUS | @ 16:00:00 | Stop: 2022-05-05

## 2022-05-05 MED ADMIN — Propofol (DIPRIVAN) injection: INTRAVENOUS | @ 16:00:00 | Stop: 2022-05-05

## 2022-05-05 MED ADMIN — ROCuronium (ZEMURON) injection: INTRAVENOUS | @ 14:00:00 | Stop: 2022-05-05

## 2022-05-05 MED ADMIN — Propofol (DIPRIVAN) injection: INTRAVENOUS | @ 14:00:00 | Stop: 2022-05-05

## 2022-05-05 MED ADMIN — heparin (porcine) 1000 unit/mL injection: INTRAVENOUS | @ 16:00:00 | Stop: 2022-05-05

## 2022-05-05 MED ADMIN — iohexol (OMNIPAQUE) 350 mg iodine/mL solution: INTRAVENOUS | @ 16:00:00 | Stop: 2022-05-05

## 2022-05-05 MED ADMIN — phenylephrine 0.8 mg/10 mL (80 mcg/mL) injection: INTRAVENOUS | @ 15:00:00 | Stop: 2022-05-05

## 2022-05-05 MED ADMIN — isosorbide dinitrate (ISORDIL) tablet 20 mg: 20 mg | ORAL

## 2022-05-05 MED ADMIN — ROCuronium (ZEMURON) injection: INTRAVENOUS | @ 15:00:00 | Stop: 2022-05-05

## 2022-05-05 MED ADMIN — heparin (porcine) 1000 unit/mL injection: INTRAVENOUS | @ 15:00:00 | Stop: 2022-05-05

## 2022-05-05 MED ADMIN — lactated Ringers infusion: INTRAVENOUS | @ 14:00:00 | Stop: 2022-05-05

## 2022-05-05 MED ADMIN — midazolam (VERSED) injection: INTRAVENOUS | @ 15:00:00 | Stop: 2022-05-05

## 2022-05-05 MED ADMIN — lidocaine (XYLOCAINE) 20 mg/mL (2 %) injection: INTRAVENOUS | @ 14:00:00 | Stop: 2022-05-05

## 2022-05-05 MED ADMIN — acetaminophen (OFIRMEV) 10 mg/mL injection: INTRAVENOUS | @ 16:00:00 | Stop: 2022-05-05

## 2022-05-05 MED ADMIN — lactated Ringers infusion: INTRAVENOUS | @ 15:00:00 | Stop: 2022-05-05

## 2022-05-05 MED ADMIN — ceFAZolin (ANCEF) injection: INTRAVENOUS | @ 14:00:00 | Stop: 2022-05-05

## 2022-05-05 MED ADMIN — ondansetron (ZOFRAN) injection: INTRAVENOUS | @ 16:00:00 | Stop: 2022-05-05

## 2022-05-05 MED ADMIN — fentaNYL (PF) (SUBLIMAZE) injection: INTRAVENOUS | @ 17:00:00 | Stop: 2022-05-05

## 2022-05-05 MED ADMIN — sugammadex (BRIDION) injection: INTRAVENOUS | @ 17:00:00 | Stop: 2022-05-05

## 2022-05-05 NOTE — Unmapped (Signed)
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   If you have questions about this appointment or need to change the appointment call Irving Burton RN at [phone].

## 2022-05-05 NOTE — Unmapped (Signed)
Pediatric Cardiac Catheterization History & Physical        Patient Name: Colin Hunter  DOB: 1970-07-20  MRN: [id number]  PCP: [facility] [address]  Referring Cardiologist: Simmie Davies, MD  Date of Procedure: 05/05/22      Brief Patient History: Colin Hunter is a 52 y.o. male with a history of surgical pulmonary valvotomy, pulmonary insufficiency and progressive right ventricular dilation. He is referred for cardiac catheterization and transcatheter pulmonary valve. There have been no recent illnesses.      Physical Exam:  BP 135/77  - Pulse 78  - Temp 36.4 ??C (97.5 ??F) (Oral)  - Resp 23  - Ht 188 cm (6' 2)  - Wt (!) 121 kg (266 lb 12.1 oz)  - SpO2 96%  - BMI 34.25 kg/m??   GENERAL APPEARANCE: Well appearing. Acyanotic. No acute distress.  CV: Regular rate and rhythm. No murmur, rub or gallop.     HEENT: Anicteric sclerae. Normal conjunctivae. Moist mucous membranes.  LUNGS: Normal respiratory effort. Clear to auscultation bilaterally.  CHEST: Quiet precordium.    ABDOMEN: Soft, nontender; no hepatomegaly.  EXTREMITIES: No peripheral edema observed. No deformity. 2+ upper and lower extremity pulses.  SKIN: No rash.  NEUROLOGIC: Moving all extremities. No focal deficits.      Assessment & Plan:  Colin Hunter is a 52 y.o. male with severe pulmonary insufficiency and RV dilation. The plan is for cardiac catheterization and transcatheter pulmonary valve placement. There are no additional concerns today so we will proceed with the procedure as planned.      CONSENT FOR OPERATION OR PROCEDURE: PROVIDER CERTIFICATION   I hereby certify that the nature, purpose, benefits, usual and most frequent risks of, and alternatives to, the operation or procedure have been explained to the patient (or person authorized to sign for the patient) either by a physician or by the provider who is to perform the operation or procedure, that the patient has had an opportunity to ask questions, and that those questions have been answered. The patient or the patient's representative has been advised that selected tasks may be performed by assistants to the primary health care provider(s). I believe that the patient (or person authorized to sign for the patient) understands what has been explained, and has consented to the operation or procedure.      Roosvelt Harps, DO MSCR  Pediatric and Adult Congenital Interventional Cardiology  [facility]  [phone] (office phone) or [phone] (pager)

## 2022-05-05 NOTE — Unmapped (Signed)
Brief Operative Note  (CSN: [id number])      Date of Surgery: 05/05/2022    Pre-op Diagnosis:   Patient Active Problem List    Diagnosis Date Noted    Syncope 11/12/2020    CKD (chronic kidney disease) stage 3, GFR 30-59 ml/min (CMS-HCC) 06/29/2019    PFO (patent foramen ovale) 06/14/2019    Atrial thrombus 05/30/2019    Atrial fibrillation (CMS-HCC) 05/22/2019    Heart failure with reduced ejection fraction (CMS-HCC) 05/12/2019    Chronic myeloid leukemia (CMS-HCC) 07/16/2015    Essential hypertension 06/10/2015    Gastroesophageal reflux disease 05/21/2009    Type 2 diabetes mellitus treated with insulin (CMS-HCC) 05/21/2009    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    Post-op Diagnosis: same as above    Procedure(s):  Peds TPVR: 33477 (CPT??)  Note: Revisions to procedures should be made in chart - see Procedures activity.    Performing Service: Cardiology  Surgeon(s) and Role:     * Karl Bales, Shary Decamp, DO - Primary     * Fatima Blank, MD - Assisting    Assistant: None    Access:  26 Fr Right femoral vein  7 Fr Left femoral vein  5 Fr Left femoral artery    Findings: Severe pulmonary insufficiency now s/p transcatheter pulmonary valve placement (Medtronic Harmony 25mm valve) without insufficiency or stenosis post valve placement.  Pre-valve placement hemodynamics:  No intracardiac shunts  MV sat 70%  Mean RA 12   RV 42/14  RPA 38/10 m21  R wedge 13    Anesthesia: General    Estimated Blood Loss: 15 mL     Complications: None    Specimens: None collected    Implants:   Implant Name Type Inv. Item Serial No. Manufacturer Lot No. LRB No. Used Action   Valve Pulm Harmony Tpv 25 - [id number] Heart Valve Ring Valve Pulm Harmony Tpv 25 [id number] [employer] [place]  N/A 1 Implanted       Surgeon Notes: I was present and scrubbed for the entire procedure.    Roosvelt Harps, DO MSCR  Pediatric and Adult Congenital Interventional Cardiology  [facility]  [phone] (office phone) or [phone] (pager)

## 2022-05-05 NOTE — Unmapped (Signed)
Pediatric Cardiology Post Cath Handoff Note/Plan    Brief HPI:  Colin Hunter Colin Hunter) is a 52 y.o. male with a cardiac history of surgical pulmonary valvotomy, severe pulmonary insufficiency and progressive right ventricular dilation.     Procedure: (see Op Note for more details)   Transcatheter pulmonary valve replacement    Access:   7 Fr LFV  5 Fr LFA  26 Fr RFV    Device placed: 25mm Harmony Valve    Colin Hunter underwent TPVR today with Dr. Jeanett Schlein and Dr. Karl Bales.     Plan:  -Overnight admission to adult [facility] service  -Strict bedrest until tomorrow morning  -Foley can be removed tomorrow morning  -Suture to right groin will need to be removed tomorrow morning  -Continuous telemetry monitoring  -Antibiotics: 2 more doses (last dose of Ancef was at 0900)  -Anticoagulation: restart Eliquis tomorrow  -Morning studies: 2 view CXR, Echo and EKG  -Zio patch to be placed prior to discharge (duration 7 days)  -Follow up: 1 month (will have adult congenital nurse coordinator schedule)  -SBE Prophylaxis Indicated: Yes       Rosalio Loud, CPNP-AC/PC  [facility]  Pediatric Cardiology - Pediatric Cardiac Cath Lab  Pager: [phone]

## 2022-05-05 NOTE — Unmapped (Signed)
Cardiology - Team 2 [facility]) History & Physical    Assessment & Plan:   [name] is a 52 y.o. male whose presentation is complicated by congential pulmonary valve stenosis s/p surgical valvotomy as a child c/b chronic pulmonary valve insufficiency and dilated RV/dysfunction, HFrEF (with EF >55% Nov 2023), atrial flutter, PFO, CKD Stage 3, CML, T2DM, HTN, HLD that presented to [facility] for transcatheter pulmonary valve replacement.     Principal Problem:    Severe pulmonary valve insufficiency  Active Problems:    Atrial fibrillation (CMS-HCC)    Chronic myeloid leukemia (CMS-HCC)    CKD (chronic kidney disease) stage 3, GFR 30-59 ml/min (CMS-HCC)    Congenital pulmonary valve stenosis    Essential hypertension    Gastroesophageal reflux disease    Heart failure with reduced ejection fraction (CMS-HCC)    Type 2 diabetes mellitus treated with insulin (CMS-HCC)  Resolved Problems:    * No resolved hospital problems. *      Active Problems    Severe pulmonary insufficiency s/p pulmonary valve replacement  - RV dilation and failure - HFrEF (EF >55% Nov 2023)  History of congenital pulmonary valve stenosis s/p surgical valvotomy as a child that has been complicated by chronic moderate to severe pulmonary valve insufficiency and dilated RV with reduced function. Patient is s/p placement of pulmonary valve replacement (25 mm Harmony) 1/25 without immediate complications. Access was obtained through the R fem vein, L fem vein, and L fem artery. No acute concern for bleeding following the procedure. Will continue to monitor overnight, obtain labs and imaging as noted below, and potentially discharge tomorrow.  - CBC, BMP, Mg in AM  - Strict bedrest until tomorrow morning  - Foley can be removed tomorrow morning  - Suture to right groin will need to be removed tomorrow morning  - Continuous telemetry monitoring  - Antibiotics: 2 more doses (last dose of Ancef was at 0900)  - Anticoagulation: restart Eliquis tomorrow  - Morning studies: 2 view CXR, Echo and EKG  - Zio patch to be placed prior to discharge (duration 7 days)  - Follow up: 1 month (will have adult congenital nurse coordinator schedule)  - SBE Prophylaxis Indicated: Yes    Atrial Flutter  S/p ablation for atrial flutter in August 2022. Continuing home metoprolol tartrate 25 mg daily. Will plan to restart Eliquis 5 mg BID tomorrow.  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 Continue asciminib 40 mg BID  HTN - isordil 20 mg TID  HLD - continue rosuvastatin 20 mg daily  T2DM - at home taking 40 units Lantus BID, 26 units Lispro TID, and Jardiance. Continuing Jardiance 10 mg daily, 26 units Lispro with meals, and will half Lantus to 20 units BID.      Checklist:  Diet: Regular Diet  DVT PPx: Patient Already on Full Anticoagulation with Eliquis  Code Status: Full Code  Dispo: Admit for observation overnight    Team Contact Information:   Primary Team: Cardiology - Team 2 [facility])  Primary Resident: Marilu Favre, MD      Chief Concern:   Severe pulmonary valve insufficiency      Subjective:   [name] is a 52 y.o. male whose presentation is complicated by pulmonary valve stenosis s/p surgical valvotomy as a child c/b chronic  pulmonary valve insufficiency and dilated RV/dysfunction, HFrEF (with EF >55% Nov 2023), atrial flutter, CKD Stage 3, CML, T2DM, HTN, HLD that presented to [facility] for transcatheter pulmonary valve replacement.     History of congenital pulmonary valve stenosis s/p surgical valvotomy as a child that has been complicated by chronic moderate to severe pulmonary valve insufficiency and dilated RV with reduced function. Follows with Dr. Dayle Points, has been considered for pulmonic valve replacement after presenting in 2022 with episode of syncope in the setting of volume overload and afib/aflutter.     Patient evaluated in the Cath Lab following his procedure. He explains he has been experiencing intermittent lightheadedness and dizziness leading up to his procedure, however denied shortness of breath or chest pain. He has no acute complaints at the time of examination.      EP Procedures:  - Cardioversion 08/2021  - Aflutter Ablation 11/2020  - Cardioversion 12/2019  - Cardioversion 10/2019    TTE 02/11/22:  Summary    1. The left ventricle is normal in size with mildly increased wall  thickness.    2. The left ventricular systolic function is normal, LVEF is visually  estimated at > 55%.    3. The mitral valve leaflets are mildly thickened with normal leaflet  mobility.    4. The right ventricle is dilated in size, with low normal systolic  function.    5. There is severe pulmonic regurgitation.    CTA Cardiac 12/2020:  -Pulmonic valve with thin pliable leaflets and incomplete coaptation during diastole  -Moderately dilated pulmonary arteries  -Dilated right ventricle with moderately reduced systolic function  -Normal left ventricular systolic function    RHC 11/2020:  Low cardiac output and index consistent with known RV failure   Moderate pulmonary hypertension with mild elevation of PVR      Allergies  Patient has no known allergies.    I reviewed the Medication List.    Prior to Admission medications    Medication Dose, Route, Frequency   albuterol HFA 90 mcg/actuation inhaler 2 puffs, Inhalation, Every 6 hours PRN   apixaban (ELIQUIS) 5 mg Tab 5 mg, Oral, 2 times a day (standard)   asciminib (SCEMBLIX) 40 mg tablet Take 1 tablet (40 mg total) by mouth Two (2) times a day. Take on an empty stomach, at least 1 hour before or 2 hours after a meal. Swallow tablets whole. Do not break, crush, or chew the tablets.   folic acid (FOLVITE) 1 MG tablet 1 mg, Oral, Daily   HUMULIN R REGULAR U-100 INSULN 100 unit/mL injection INJECT 15 UNITS SUBCUTANEOUSLY THREE TIMES DAILY BEFORE MEAL(S)   insulin glargine (BASAGLAR, LANTUS) 100 unit/mL (3 mL) injection pen 27 Units, Subcutaneous, Nightly   insulin lispro (HUMALOG) 100 unit/mL injection pen INJECT 26 UNITS THREE TIMES DAILY BEFORE MEAL(S)   JARDIANCE 10 mg tablet 10 mg, Oral, Daily (standard)   metOLazone (ZAROXOLYN) 2.5 MG tablet 2.5 mg, Oral, Daily (standard)   metoprolol succinate (TOPROL-XL) 100 MG 24 hr tablet 100 mg, Oral, Daily (standard)   metoPROLOL tartrate (LOPRESSOR) 25 MG tablet    OZEMPIC 0.25 mg or 0.5 mg(2 mg/1.5 mL) PnIj injection 0.5 mg, Subcutaneous, Every 7 days   pantoprazole (PROTONIX) 40 MG tablet 40 mg, Oral, Daily (standard)   pregabalin (LYRICA) 100 MG capsule 100 mg, Oral, 3 times a day (standard)   simethicone (MYLICON) 80 MG chewable tablet 80 mg, Oral, Every 6 hours PRN   torsemide 40 mg Tab 40 mg, Oral,  2 times a day, 40 mg in the morning and 20 mg in the evening   BINAXNOW COVID-19 AG SELF TEST Kit    blood pressure monitor Kit 1 each, Miscellaneous, Daily   blood sugar diagnostic Strp    blood-glucose meter Misc    isosorbide dinitrate (ISORDIL) 20 MG tablet 20 mg, Oral, 3 times a day (standard)   lancets Misc Miscellaneous   lisinopriL (PRINIVIL,ZESTRIL) 5 MG tablet 5 mg, Oral, Daily (standard)   ONETOUCH VERIO TEST STRIPS Strp USE 1 STRIP TO CHECK GLUCOSE TWICE DAILY   rosuvastatin (CRESTOR) 20 MG tablet 20 mg, Oral, Daily (standard)   traMADol (ULTRAM) 50 mg tablet        Librarian, academic:  [name] currently has decisional capacity for healthcare decision-making and is able to designate a surrogate healthcare decision maker. Mr. Maryla Morrow designated healthcare decision maker(s) is/are Sherald Hess (the patient's adult child) as denoted by stated patient preference.    Objective:   Physical Exam:  Temp:  [36.4 ??C (97.5 ??F)] 36.4 ??C (97.5 ??F)  Heart Rate:  [67-90] 68  SpO2 Pulse:  [61-96] 66  Resp:  [13-28] 14  BP: (135-192)/(77-112) 180/105  SpO2:  [94 %-100 %] 97 %    Gen: NAD, converses appropriately  HEENT: Atraumatic, normocephalic, EOMI  Heart: RRR  Lungs: CTAB, no crackles or wheezes  Abdomen: Soft, NTND  Groin: Catheterization sites examined. Pressure dressing in place over right cath site, no evidence of bleeding  Extremities: No peripheral edema  Neuro: Grossly symmetric, non-focal        Marilu Favre, DO  Internal Medicine Resident, PGY-2

## 2022-05-06 LAB — BASIC METABOLIC PANEL
ANION GAP: 9 mmol/L (ref 5–14)
ANION GAP: 8 mmol/L (ref 5–14)
BLOOD UREA NITROGEN: 27 mg/dL — ABNORMAL HIGH (ref 9–23)
BLOOD UREA NITROGEN: 55 mg/dL — ABNORMAL HIGH (ref 9–23)
BUN / CREAT RATIO: 7
BUN / CREAT RATIO: 14
CALCIUM: 8.4 mg/dL — ABNORMAL LOW (ref 8.7–10.4)
CALCIUM: 8.3 mg/dL — ABNORMAL LOW (ref 8.7–10.4)
CHLORIDE: 106 mmol/L (ref 98–107)
CHLORIDE: 103 mmol/L (ref 98–107)
CO2: 18 mmol/L — ABNORMAL LOW (ref 20.0–31.0)
CO2: 20 mmol/L (ref 20.0–31.0)
CREATININE: 3.65 mg/dL — ABNORMAL HIGH
CREATININE: 4.06 mg/dL — ABNORMAL HIGH
EGFR CKD-EPI (2021) MALE: 19 mL/min/{1.73_m2} — ABNORMAL LOW (ref >=60)
EGFR CKD-EPI (2021) MALE: 17 mL/min/{1.73_m2} — ABNORMAL LOW (ref >=60)
GLUCOSE RANDOM: 173 mg/dL (ref 70–179)
GLUCOSE RANDOM: 206 mg/dL — ABNORMAL HIGH (ref 70–179)
POTASSIUM: 4.8 mmol/L (ref 3.4–4.8)
POTASSIUM: 5 mmol/L — ABNORMAL HIGH (ref 3.4–4.8)
SODIUM: 133 mmol/L — ABNORMAL LOW (ref 135–145)
SODIUM: 131 mmol/L — ABNORMAL LOW (ref 135–145)

## 2022-05-06 LAB — CBC
HEMATOCRIT: 44.2 % (ref 39.0–48.0)
HEMOGLOBIN: 14.8 g/dL (ref 12.9–16.5)
MEAN CORPUSCULAR HEMOGLOBIN CONC: 33.6 g/dL (ref 32.0–36.0)
MEAN CORPUSCULAR HEMOGLOBIN: 28.8 pg (ref 25.9–32.4)
MEAN CORPUSCULAR VOLUME: 85.9 fL (ref 77.6–95.7)
MEAN PLATELET VOLUME: 9 fL (ref 6.8–10.7)
PLATELET COUNT: 90 10*9/L — ABNORMAL LOW (ref 150–450)
RED BLOOD CELL COUNT: 5.14 10*12/L (ref 4.26–5.60)
RED CELL DISTRIBUTION WIDTH: 14.5 % (ref 12.2–15.2)
WBC ADJUSTED: 9 10*9/L (ref 3.6–11.2)

## 2022-05-06 LAB — MAGNESIUM: MAGNESIUM: 2.2 mg/dL (ref 1.6–2.6)

## 2022-05-06 MED ORDER — VENTOLIN HFA 90 MCG/ACTUATION AEROSOL INHALER
Freq: Four times a day (QID) | RESPIRATORY_TRACT | 0 refills | 30 days | Status: CP | PRN
Start: 2022-05-06 — End: 2022-06-05
  Filled 2022-05-12: qty 18, 25d supply, fill #0

## 2022-05-06 MED ADMIN — insulin lispro (HumaLOG) injection 26 Units: 26 [IU] | SUBCUTANEOUS | @ 17:00:00

## 2022-05-06 MED ADMIN — isosorbide dinitrate (ISORDIL) tablet 20 mg: 20 mg | ORAL | @ 11:00:00

## 2022-05-06 MED ADMIN — acetaminophen (TYLENOL) tablet 650 mg: 650 mg | ORAL | @ 01:00:00

## 2022-05-06 MED ADMIN — apixaban (ELIQUIS) tablet 5 mg: 5 mg | ORAL | @ 14:00:00

## 2022-05-06 MED ADMIN — insulin glargine (LANTUS) injection 20 Units: 20 [IU] | SUBCUTANEOUS | @ 01:00:00

## 2022-05-06 MED ADMIN — pregabalin (LYRICA) capsule 100 mg: 100 mg | ORAL | @ 20:00:00

## 2022-05-06 MED ADMIN — ceFAZolin (ANCEF) 3 g in sodium chloride (NS) 0.9 % 100 mL IVPB: 3 g | INTRAVENOUS | @ 11:00:00 | Stop: 2022-05-06

## 2022-05-06 MED ADMIN — folic acid (FOLVITE) tablet 1 mg: 1 mg | ORAL | @ 14:00:00

## 2022-05-06 MED ADMIN — acetaminophen (TYLENOL) tablet 650 mg: 650 mg | ORAL | @ 16:00:00

## 2022-05-06 MED ADMIN — isosorbide dinitrate (ISORDIL) tablet 20 mg: 20 mg | ORAL | @ 22:00:00

## 2022-05-06 MED ADMIN — lactated ringers bolus 1,000 mL: 1000 mL | INTRAVENOUS | @ 17:00:00 | Stop: 2022-05-06

## 2022-05-06 MED ADMIN — insulin lispro (HumaLOG) injection 26 Units: 26 [IU] | SUBCUTANEOUS | @ 01:00:00

## 2022-05-06 MED ADMIN — insulin lispro (HumaLOG) injection 26 Units: 26 [IU] | SUBCUTANEOUS | @ 22:00:00

## 2022-05-06 MED ADMIN — empagliflozin (JARDIANCE) tablet 10 mg: 10 mg | ORAL | @ 14:00:00

## 2022-05-06 MED ADMIN — pregabalin (LYRICA) capsule 100 mg: 100 mg | ORAL | @ 01:00:00

## 2022-05-06 MED ADMIN — lactated Ringers infusion: 100 mL/h | INTRAVENOUS | @ 23:00:00 | Stop: 2022-05-07

## 2022-05-06 MED ADMIN — ceFAZolin (ANCEF) 3 g in sodium chloride (NS) 0.9 % 100 mL IVPB: 3 g | INTRAVENOUS | @ 03:00:00 | Stop: 2022-05-06

## 2022-05-06 MED ADMIN — isosorbide dinitrate (ISORDIL) tablet 20 mg: 20 mg | ORAL | @ 17:00:00

## 2022-05-06 MED ADMIN — insulin glargine (LANTUS) injection 20 Units: 20 [IU] | SUBCUTANEOUS | @ 14:00:00

## 2022-05-06 MED ADMIN — pregabalin (LYRICA) capsule 100 mg: 100 mg | ORAL | @ 14:00:00

## 2022-05-06 MED ADMIN — insulin lispro (HumaLOG) injection 26 Units: 26 [IU] | SUBCUTANEOUS | @ 14:00:00

## 2022-05-06 MED ADMIN — pantoprazole (Protonix) EC tablet 40 mg: 40 mg | ORAL | @ 14:00:00

## 2022-05-06 MED ADMIN — acetaminophen (TYLENOL) tablet 650 mg: 650 mg | ORAL | @ 09:00:00

## 2022-05-06 NOTE — Unmapped (Shared)
Cardiology Discharge Summary    Identifying Information:  Colin Hunter  06-Apr-1971  [id number]     Admit Date: 05/05/2022  Discharge Date: 05/06/2022   Discharge To: Home  Discharge Service: Cardiology [facility])  Discharge Attending Physician: Meredith Leeds, MD  Discharge Cardiology Fellow: Antionette Poles, MD  Primary Care Physician: [facility]     Discharge Diagnoses:  Principal Diagnosis:    Severe pulmonary valve insufficiency    Secondary Diagnoses:    Atrial fibrillation (CMS-HCC)    Chronic myeloid leukemia (CMS-HCC)    CKD (chronic kidney disease) stage 3, GFR 30-59 ml/min (CMS-HCC)    Congenital pulmonary valve stenosis    Essential hypertension    Gastroesophageal reflux disease    Heart failure with reduced ejection fraction (CMS-HCC)    Type 2 diabetes mellitus treated with insulin (CMS-HCC)            Outpatient Provider Follow Up Issues:  -- Re-check serum creatinine and close monitoring of kidney function following pulmonary valve replacement with use of contrast during procedure.    Hospital Course:  Colin Hunter is a 52 y.o. male whose presentation is complicated by congential pulmonary valve stenosis s/p surgical valvotomy as a child c/b chronic pulmonary valve insufficiency and dilated RV/dysfunction, HFrEF (with EF >55% Nov 2023), atrial flutter, PFO, CKD Stage 3, CML, T2DM, HTN, HLD that presented to [facility] for transcatheter pulmonary valve replacement.      Severe pulmonary insufficiency s/p pulmonary valve replacement  - RV dilation and failure - HFrEF (EF >55% Nov 2023)  History of congenital pulmonary valve stenosis s/p surgical valvotomy as a child that has been complicated by chronic moderate to severe pulmonary valve insufficiency and dilated RV with reduced function. Patient is s/p uncomplicated placement of pulmonary valve replacement (25 mm Harmony) 1/25. Access was obtained through the R fem vein, L fem vein, and L fem artery and these sites were monitored without concern for bleeding or hematoma. He received three doses of Ancef for prophylaxis. He remained on bedrest overnight following the procedure. He was able to ambulate without difficulty the following morning and had the right groin suture removed prior to discharge. CXR and echocardiogram the morning after the procedure demonstrated that the valve was properly in place and demonstrated no significant pulmonary regurgitation. Ziopatch placed prior to discharge which will remain in place for 7 days. Patient will follow-up with the Adult Congenital Valve team in one month.    CKD Stage 3   Patient with history of known CKD Stage 3. Creatinine was mildly elevated from baseline on admission (3.43) with acute increase to 3.65 the following morning. Patient received contrast during procedure, however effects from contrast on the creatinine would not be expected this soon after having the procedure completed. He was given 1L IVF with .......    Patient has very close follow-up scheduled with his Nephrologist on February 1 which is re-assuring. Discussed this with the patient and strongly encouraged him to attend this follow-up to have his serum creatinine rechecked.     Procedures:    PR TCAT PULMONARY VALVE IMPLANTATION PRQ APPROACH [33477] (Peds Left/Right Heart Catheterization W Intervention)    Discharge Day Services:  BP 146/92  - Pulse 73  - Temp 36.8 ??C (98.2 ??F) (Oral)  - Resp 18  - Ht 188 cm (6' 2)  - Wt (!) 127.6 kg (281 lb 4.9 oz)  - SpO2 100%  - BMI 36.12 kg/m??   Pt seen on the  day of discharge and determined appropriate for discharge.    Condition at Discharge: stable    Discharge Medications:     Your Medication List        STOP taking these medications      albuterol 90 mcg/actuation inhaler  Commonly known as: PROVENTIL HFA;VENTOLIN HFA  Replaced by: VENTOLIN HFA 90 mcg/actuation inhaler     HumuLIN R Regular U-100 Insuln 100 unit/mL injection  Generic drug: insulin regular     lisinopril 5 MG tablet  Commonly known as: PRINIVIL,ZESTRIL     metOLazone 2.5 MG tablet  Commonly known as: ZAROXOLYN     metoPROLOL succinate 100 MG 24 hr tablet  Commonly known as: Toprol-XL     torsemide 40 mg Tab     traMADol 50 mg tablet  Commonly known as: ULTRAM            START taking these medications      VENTOLIN HFA 90 mcg/actuation inhaler  Generic drug: albuterol  Inhale 2 puffs every six (6) hours as needed.  Replaces: albuterol 90 mcg/actuation inhaler            CONTINUE taking these medications      apixaban 5 mg Tab  Commonly known as: ELIQUIS  Take 1 tablet (5 mg total) by mouth Two (2) times a day.     BINAXNOW COVID-19 AG SELF TEST Kit  Generic drug: COVID-19 antigen test     blood pressure monitor Kit  1 each by Miscellaneous route in the morning.     blood sugar diagnostic Strp     ONETOUCH VERIO TEST STRIPS Strp  Generic drug: blood sugar diagnostic  USE 1 STRIP TO CHECK GLUCOSE TWICE DAILY     blood-glucose meter Misc     folic acid 1 MG tablet  Commonly known as: FOLVITE  Take 1 tablet (1 mg total) by mouth in the morning.     insulin glargine 100 unit/mL (3 mL) injection pen  Commonly known as: BASAGLAR, LANTUS  Inject 0.4 mL (40 Units total) under the skin two (2) times a day.     insulin lispro 100 unit/mL injection pen  Commonly known as: HumaLOG  INJECT 26 UNITS THREE TIMES DAILY BEFORE MEAL(S)     isosorbide dinitrate 20 MG tablet  Commonly known as: ISORDIL  Take 1 tablet (20 mg total) by mouth Three (3) times a day.     JARDIANCE 10 mg tablet  Generic drug: empagliflozin  Take 1 tablet (10 mg total) by mouth daily.     lancets Misc  by Miscellaneous route.     metoPROLOL tartrate 25 MG tablet  Commonly known as: Lopressor     OZEMPIC 0.25 mg or 0.5 mg(2 mg/1.5 mL) Pnij injection  Generic drug: semaglutide  Inject 0.5 mg under the skin every seven (7) days.     pantoprazole 40 MG tablet  Commonly known as: Protonix  Take 1 tablet (40 mg total) by mouth daily.     pregabalin 100 MG capsule  Commonly known as: LYRICA  Take 1 capsule (100 mg total) by mouth Three (3) times a day.     rosuvastatin 20 MG tablet  Commonly known as: CRESTOR  Take 1 tablet (20 mg total) by mouth daily.     SCEMBLIX 40 mg tablet  Generic drug: asciminib  Take 1 tablet (40 mg total) by mouth Two (2) times a day. Take on an empty stomach, at least 1 hour before or 2 hours  after a meal. Swallow tablets whole. Do not break, crush, or chew the tablets.     simethicone 80 MG chewable tablet  Commonly known as: MYLICON  Chew 1 tablet (80 mg total) every six (6) hours as needed for flatulence.              Recent Labs:  Microbiology Results (last day)       Procedure Component Value Date/Time Date/Time    COVID-19 PCR [[id number]]  (Normal) Collected: 05/05/22 1553    Lab Status: Final result Specimen: Nasopharyngeal Swab Updated: 05/05/22 1955     SARS-CoV-2 PCR Negative    Narrative:      This test was performed using the Cepheid Xpert Xpress SARS-CoV-2 assay which has been validated by the CLIA-certified, CAP-inspected [facility] Clinical Molecular Microbiology Laboratory. FDA has granted Emergency Use Authorization for this test. This real-time RT-PCR test detects SARS-CoV-2 by targeting the N2 and E genes. Negative results do not preclude SARS-CoV-2 infection and should not be used as the sole basis for patient management decisions. Negative results must be combined with clinical observations, patient history, and epidemiological information. Information for providers and patients can be found here: [email or web address]            Lab Results   Component Value Date    WBC 9.0 05/06/2022    HGB 14.8 05/06/2022    HCT 44.2 05/06/2022    PLT 90 (L) 05/06/2022     Lab Results   Component Value Date    NA 133 (L) 05/06/2022    K 4.8 05/06/2022    CL 106 05/06/2022    CO2 18.0 (L) 05/06/2022    BUN 27 (H) 05/06/2022    CREATININE 3.65 (H) 05/06/2022    CALCIUM 8.4 (L) 05/06/2022    MG 2.2 05/06/2022    PHOS 4.5 12/07/2020     Lab Results   Component Value Date    ALKPHOS 248 (H) 05/05/2022    BILITOT 0.5 05/05/2022    BILIDIR <0.10 06/13/2020    PROT 5.9 05/05/2022    ALBUMIN 3.0 (L) 05/05/2022    ALT 54 (H) 05/05/2022    AST 34 05/05/2022     Lab Results   Component Value Date    PT 13.3 11/12/2020    INR 1.14 11/12/2020    APTT 83.3 (H) 12/04/2020     Radiology:  Echocardiogram Follow Up/Limited Echo    Result Date: 05/06/2022  Patient Info Name:     Colin Hunter Age:     51 years DOB:     [birth year] Gender:     Male MRN:     [id number] Accession #:     [id number] UN Account #:     [id number] Ht:     188 cm Wt:     128 kg BSA:     2.62 m2 Exam Date:     05/06/2022 8:15 AM Admit Date:     05/05/2022 Exam Type:     ECHOCARDIOGRAM FOLLOW UP/LIMITED ECHO Technical Quality:     Fair Staff Sonographer:     Bubba Hales Referring Physician:     Charlann Noss MD Study Info Indications      - post pulmonary valve replacement Procedure(s)   Limited 2D, color flow and Doppler transthoracic echocardiogram is performed. Summary   1. Limited study to assess newly placed pulmonic valve replacement.   2. The 25 mm Medtronic Harmony pulmonic valve (placed 05/05/22) appears structurally and functionally normal by  two-dimensional, color flow Doppler and Doppler interrogation.   3. There is no significant pulmonic regurgitation.   4. The right ventricle is moderately dilated in size, with mildly reduced systolic function.   5. There is moderate tricuspid regurgitation. Left Ventricle   The left ventricle is normal in size with moderately increased wall thickness. The left ventricular systolic function is normal, LVEF is visually estimated at 55-60%. Left ventricular diastolic function cannot be accurately assessed. Right Ventricle   The right ventricle is moderately dilated in size, with mildly reduced systolic function. Ventricular Septum   Abnormal ventricular septal motion consistent with RV volume overload (diastolic flattening). Aortic Valve   The aortic valve is trileaflet with normal appearing leaflets with normal excursion. Mitral Valve   The mitral valve leaflets are normal with normal leaflet mobility. There is no significant mitral valve regurgitation. Tricuspid Valve   The tricuspid valve leaflets are normal, with normal leaflet mobility. There is moderate tricuspid regurgitation. TR maximum velocity: 3.1 m/s  Estimated RVSP: 41 mmHg. Pulmonic Valve   The 25 mm Medtronic Harmony pulmonic valve (placed 05/05/22) appears structurally and functionally normal by two-dimensional, color flow Doppler and Doppler interrogation. There is no significant pulmonic regurgitation. There is no evidence of a significant transvalvular gradient. Mean gradient across the Medtronic Harmony pulmonary valve is 9 mmHg. Peak gradient 15 mm Hg. Peak velocity 1.9 m/sec. Inferior Vena Cava   IVC size and inspiratory change suggest low right atrial pressure. (<3 mmHg). Pericardium/Pleural   There is no pericardial effusion. Other Findings   Rhythm: Sinus Rhythm. Ventricles ---------------------------------------------------------------------- Name                                 Value        Normal ---------------------------------------------------------------------- LV Dimensions 2D/MM ----------------------------------------------------------------------  IVS Diastolic Thickness (2D)                                1.3 cm       0.6-1.0 LVID Diastole (2D)                  4.0 cm       4.2-5.8  LVPW Diastolic Thickness (2D)                                1.3 cm       0.6-1.0 LVID Systole (2D)                   2.4 cm       2.5-4.0 Mitral Valve ---------------------------------------------------------------------- Name                                 Value        Normal ---------------------------------------------------------------------- MV Diastolic Function ---------------------------------------------------------------------- MV E Peak Velocity                101 cm/s               MV A Peak Velocity                 83 cm/s               MV E/A  1.2               MV Annular TDI ---------------------------------------------------------------------- MV Septal e' Velocity             5.5 cm/s         >=8.0 MV E/e' (Septal)                      18.3               MV Lateral e' Velocity            5.4 cm/s        >=10.0 MV E/e' (Lateral)                     18.6               MV e' Average                     5.5 cm/s               MV E/e' (Average)                     18.5 Tricuspid Valve ---------------------------------------------------------------------- Name                                 Value        Normal ---------------------------------------------------------------------- TV Regurgitation Doppler ---------------------------------------------------------------------- TR Peak Velocity                   3.1 m/s               Estimated PAP/RSVP ---------------------------------------------------------------------- RA Pressure                         3 mmHg           <=5 RV Systolic Pressure               41 mmHg           <36 Pulmonic Valve ---------------------------------------------------------------------- Name                                 Value        Normal ---------------------------------------------------------------------- PV Doppler ---------------------------------------------------------------------- PV Peak Velocity                   1.9 m/s Report Signatures Finalized by Hart Carwin  MD on 05/06/2022 12:47 PM Resident Allene Dillon  Roy-Chaudhury on 05/06/2022 09:45 AM    XR Chest 2 views    Result Date: 05/06/2022  EXAM: XR CHEST 2 VIEWS ACCESSION: [id number] UN CLINICAL INDICATION: Postsurgical status. TECHNIQUE: PA and Lateral Chest Radiographs. COMPARISON: November 24, 2020. FINDINGS: Transcatheter pulmonic valve replacement. The lungs are clear with no pleural fluid or pneumothorax. Cardiomediastinal contour unremarkable.     No acute findings post pulmonic valve replacement.    ECG 12 Lead    Result Date: 05/06/2022  NORMAL SINUS RHYTHM RIGHT BUNDLE BRANCH BLOCK POSSIBLE LATERAL INFARCT  (CITED ON OR BEFORE 26-Aug-2019) INFERIOR INFARCT (CITED ON OR BEFORE 26-Aug-2019) ABNORMAL ECG WHEN COMPARED WITH ECG OF 11-Feb-2022 13:06, QUESTIONABLE CHANGE IN INITIAL FORCES OF LATERAL LEADS    Peds TPVR    Result Date: 05/05/2022  Images from the original result were not included.  PEDIATRIC CARDIAC CATHETERIZATION REPORT  Procedure Date:  05/05/22 Referring MD:   [name], MD Pre-Catheterization Diagnoses: History of surgical pulmonary valvotomy Severe pulmonary valve insufficiency Progressive, severe right ventricular dilation Procedures: Right heart catheterization normal connections (16109), Transcatheter pulmonary valve placement (60454) Summary of Findings: Severely regurgitant pulmonary valve and severe right ventricular dilation now s/p transcatheter pulmonary valve placement (Medtronic Harmony TPV25) Normal cardiac output Mild RV diastolic dysfunction Upper normal mean pulmonary artery pressure and PVR Mild LV diastolic dysfunction Complications:  None Plan: The patient will be admitted overnight and remain on bedrest. Echocardiogram, EKG and 2-view CXR will be completed and reviewed prior to discharge. [name] will resume Eliquis the day after the procedure. He requires lifelong SBE prophylaxis. A 7 day Ziopatch will be placed upon discharge. He will follow up with an [facility] cardiologist in 3-4 weeks. Clinical Summary: [name] is a 52 y.o. male with a history of surgical pulmonary valvotomy in childhood who has developed severe pulmonary valve insufficiency and progressive right ventricular dilation. He is referred for transcatheter pulmonary valve placement.   Procedure Summary: (See procedure log for time-annotated documentation) The patient was brought to the catheterization laboratory. He received general anesthesia and was maintained in 21% FiO2 for hemodynamic measurements. He was prepped and draped in the standard sterile fashion. The patient was re-identified and procedural time-out completed. Vascular access was obtained using the modified Seldinger technique with ultrasound guidance. A 7 Fr sheath was placed in both the right and left femoral veins and a 5 Fr sheath in the left femoral artery. The patient was systemically heparinized. A standard antegrade right heart catheterization was performed. A 7 Fr balloon wedge catheter was advanced to the SVC, RA, RV and Right pulmonary artery to measure oximetry and pressures, including a pulmonary capillary wedge pressure.  Angiography and interventions were performed as described below. The patient tolerated the procedure well without complications. Lines were removed and hemostasis achieved with the application of light pressure. The patient was transported to post-catheterization recovery area for post-catheterization monitoring and recovery. The findings were discussed with the patient's family and treating physicians. Hemodynamics: By oximetry, cardiac index was normal. There were no intracardiac shunts detected. Right heart filling pressure was mildly elevated. There was no obstruction from the right ventricle to the branch pulmonary arteries. Mean pulmonary artery pressures were mildly elevated, as was the wedge pressure. PVR was normal. Refer also to PedCath pictorial diagram below and time-annotated procedure log (archived elsewhere in EPIC) Angiography: An initial pulmonary arteriogram was performed to profile the RVOT and identify landmarks to guide subsequent valve implantation. Severe pulmonary regurgitation was noted. Intervention(s): Transcatheter pulmonary valve placement Indication: Severe pulmonary valve insufficiency and right ventricular dilation Details: The wedge catheter was advanced through the 7 Fr venous sheath to the right pulmonary artery. The Lunderquist double curve wire was advanced through the wedge catheter to the distal right pulmonary artery and secure wire placement was obtained prior to removal of the wedge catheter. The 7 Fr sheath was exchanged for the 26 Fr [facility] sheath which was advanced to the RVOT. The TPV25 Harmony valve was loaded on a 25 Fr delivery system and advanced over the Lunderquist wire through the 26Fr sheath to the RPA. The valve was carefully positioned, unsheathed and deployed in the RVOT with the guidance of multiple sighting angiograms and brief cineangiography bursts. Results: Successful transcatheter placement of a 25 mm Medtronic Harmony valve in the right ventricular outflow tract. The valve is well seated without residual regurgitation,  stenosis or paravalvar leak.  Roosvelt Harps, DO MSCR Pediatric and Adult Congenital Interventional Cardiology [facility] [phone] (office phone) or [phone] (pager)        Echocardiogram Pediatric Congenital Complete W Color Spect Dopp    Result Date: 05/05/2022                [facility]      Division of Pediatric Cardiology  [address], [place], [place] [address]               [phone]  NAME:       Colin Hunter      DOB: 04-Feb-1971  Ht: 188.0 cm PT ID#:     [id number]         Age: 40 years   Wt: 121.0 kg STUDY DATE: 05/05/2022 9:01:00 AM Sex: M         BSA: 2.46 m??                                                  BP: 145/90 mmHg Referring Physician: [id number] Charlann Noss Sonographer:         [id number], Earlene Plater, MADELINE  CPT Codes:        [phone] Complete congenital echocardiogram, 93320 Spectal                   Doppler and 93325 Color flow Doppler  Indications:      Surgery to heart and great vessel-V15.1 Diagnosis:        Surgery to heart and great vessel-V15.1 Study Information The images were of adequate diagnostic quality. Study Location:   [facility]   Summary:  1. H/o pulmonary valve stenosis s/p surgical valvotomy 2011.     Now s/p 25mm Harmony transcatheter pulmonary valve.  2. Mild to moderate tricuspid valve regurgitation.  3. Peak gradient through the stented RVOT is .  4. Moderately dilated right ventricle.  5. Mildly diminished right ventricular systolic function.  6. Normal size left ventricle.  7. Mildly diminished left ventricular systolic function.  8. No pericardial effusion. M-mode: RVIDd:               4.78  cm IVSd:                1.09  cm LVIDd:               4.19  cm LVIDs:               3.21  cm LVPWd:               0.91  cm LV mass (ASE corr.):  137  g LV mass index (ASE): 55.7  g Systolic Function LV SF (M-mode):   23  % LV EF (M-mode):   47  %  Tricuspid Valve Doppler Regurg peak velocity:     2.12  m/s RV Systolic Pressure (TR) 17.9  mmHg  Estimated Pressures RV systolic pressure (TR): 17.94  mmHg  Segmental Cardiotype, Cardiac Position, and Situs: Cardiac segments(S,D,S). The heart position is within the left hemithorax. The cardiac apex is oriented leftward. There is visceral situs solitus. Systemic Veins: The superior vena cava is right-sided and drains normally to the right atrium. The inferior vena cava is right-sided and inserts into the right atrium normally.  Pulmonary Veins: There is no evidence of anomalous pulmonary venous connection. Atria: The atrial septum is intact, with no evidence of interatrial shunt. There is no evidence of a patent foramen ovale. The right atrium is normal in size. The left atrium is normal in size. Tricuspid Valve: The tricuspid valve is normal. Tricuspid inflow is laminar, with normal Doppler velocity pattern. There is mild to moderate tricuspid valve regurgitation. Mitral Valve: The mitral valve is normal. Mitral inflow is laminar, with normal Doppler velocity pattern. The papillary muscle configuration appears normal. There is no evidence of mitral valve insufficiency. Left Ventricle: The left ventricle is normal in size. Left ventricular systolic function is qualitatively mildly diminished. Right Ventricle: The right ventricle is moderately dilated. Right ventricular systolic function is mildly diminished. VSD: There is no evidence of a ventricular septal defect. Left Ventricular Outflow Tract and Aortic Valve: There is no evidence of left ventricular outflow obstruction. The aortic valve is trileaflet. Transaortic flow is laminar, with normal Doppler velocity pattern. Right Ventricular Outflow Tract and Pulmonary Valve: There is no evidence of right ventricular outflow obstruction. Peak gradient through the stented RVOT is . The pulmonary valve is normal. Transpulmonary flow is laminar, with normal Doppler velocity pattern. H/o pulmonary valve stenosis s/p surgical valvotomy 2011. Now s/p 25mm Harmony transcatheter pulmonary valve. Aorta: The (aortic) sinuses of Valsalva segment is normal. The ascending aorta is normal. There is no determination of aortic arch side. The flow pattern in the aorta is normal. Pulmonary Arteries: The main pulmonary artery is normal. The left branch pulmonary artery was not well vizualized. The right branch pulmonary artery was not well visualized. Ductus Arteriosus: The ductus arteriosus was not evaluated. Coronary Arteries: The coronary arteries were not evaluated. Pericardium: There is no evidence of pericardial effusion.  [id number] Dwaine Gale MD *Electronically signed on 05/05/2022 at 12:35:35 PM  cc:   *** Final ***     Discharge Instructions:  Activity Instructions       Activity as tolerated              Other Instructions       Call MD for:  difficulty breathing, headache or visual disturbances      Call MD for:  persistent nausea or vomiting      Call MD for:  severe uncontrolled pain      Call MD for:  temperature >38.5 Celsius      Discharge instructions      You were admitted to [facility] for severe pulmonary valve insufficiency and had a pulmonary valve replacement performed 1/25. During this procedure they give a medicine called contrast to be able to visualize everything in the heart. This medication can cause kidney injury, so it is important that you drink extra fluids over these next few days.    You are now ready to discharge and will be discharging to: Home    Please take your medications as prescribed. Medication changes are listed below and a full list of medications is in this discharge packet. Please keep your follow-up appointments after the hospital for ongoing care. It has been a pleasure taking care of you, we wish you the best.     MEDICATION CHANGES:  -- No changes to your medications were made during this admission. Continue to take your medications as previously prescribed. Your medication list has been updated to reflect these changes.    FOLLOW-UP:  -- It is important to see your primary care provider (PCP) after being in the  hospital. Your PCP is [facility] and the phone number of their office is [phone]. Please call your PCP's office as soon as possible to schedule an appointment with them.   -- A Ziopatch has been placed prior to your discharge which you will continue to wear for 7 days.  -- Follow-up with the Adult Congenital Valve specialists will be scheduled for you and will take place in one month.  -- You have a follow up with your kidney doctor on 2/1. It is very important that you keep this appointment as they will be able to monitor your kidney function following this procedure.            Follow Up instructions and Outpatient Referrals     Call MD for:  difficulty breathing, headache or visual disturbances      Call MD for:  persistent nausea or vomiting      Call MD for:  severe uncontrolled pain      Call MD for:  temperature >38.5 Celsius      Discharge instructions        Appointments which have been scheduled for you      May 12, 2022  1:30 PM  (Arrive by 1:15 PM)  NEW  GENERAL with Vanessa Barbara Chelsea Aus, MD  [facility] NEPHROLOGY [facility] REGION) [address]  [place] [address]  [phone]        Jun 06, 2022 10:30 AM  (Arrive by 10:00 AM)  RETURN  30 with Charlann Noss, DO  [facility]ILDR[facility] CARDIOLOGY [facility] REGION) [facility]  [address] [place] [address]  [place] [address]  [phone]      Additional instructions:    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   If you have questions about this appointment or need to change the appointment call Irving Burton RN at [phone].            Length of Discharge: I spent less than 30 mins in the discharge of this patient.

## 2022-05-06 NOTE — Unmapped (Signed)
VSS RA , SR on the monitor , medicated for pain x2 , on IV antibiotics . B/L groin site soft , no hematoma noted, safeguard in place on RT Groin site , foley discontinued, due to void . For Xray and EKG in the morning and possible discharge   Problem: Skin Injury Risk Increased  Goal: Skin Health and Integrity  Outcome: Progressing     Problem: Fall Injury Risk  Goal: Absence of Fall and Fall-Related Injury  Outcome: Progressing  Intervention: Promote Injury-Free Environment  Recent Flowsheet Documentation  Taken 05/05/2022 2000 by Wylene Simmer, RN  Safety Interventions:   bed alarm   bleeding precautions     Problem: Wound  Goal: Optimal Coping  Outcome: Progressing  Goal: Optimal Functional Ability  Outcome: Progressing  Goal: Absence of Infection Signs and Symptoms  Outcome: Progressing  Goal: Improved Oral Intake  Outcome: Progressing  Goal: Optimal Pain Control and Function  Outcome: Progressing  Goal: Skin Health and Integrity  Outcome: Progressing  Goal: Optimal Wound Healing  Outcome: Progressing

## 2022-05-06 NOTE — Unmapped (Signed)
VSS. Pt placed on x-2 monitor and placed on sequence mode. Foley still in place and clean, dry, and intact. A/Ox4. Pt status post TPVR. Went in through R/L groin. R groin has gauze and tegederm. L groin has safeguard in place and air removed. Syringe at bedside. Pt on regular diet and on bedrest for the night. Pt on RA, NSR with some rare PVCs. Hemostasis @1230 . Sugars monitored and treated as appropriate. Pt oriented to unit, family at bedside, call bell within reach, no complaints of pain, and no further complaints thus far in shift.        Problem: Adult Inpatient Plan of Care  Goal: Plan of Care Review  Outcome: Ongoing - Unchanged  Goal: Patient-Specific Goal (Individualized)  Outcome: Ongoing - Unchanged  Goal: Absence of Hospital-Acquired Illness or Injury  Outcome: Ongoing - Unchanged  Goal: Optimal Comfort and Wellbeing  Outcome: Ongoing - Unchanged  Goal: Readiness for Transition of Care  Outcome: Ongoing - Unchanged  Goal: Rounds/Family Conference  Outcome: Ongoing - Unchanged     Problem: Wound  Goal: Optimal Coping  Outcome: Ongoing - Unchanged  Goal: Optimal Functional Ability  Outcome: Ongoing - Unchanged  Goal: Absence of Infection Signs and Symptoms  Outcome: Ongoing - Unchanged  Goal: Improved Oral Intake  Outcome: Ongoing - Unchanged  Goal: Optimal Pain Control and Function  Outcome: Ongoing - Unchanged  Goal: Skin Health and Integrity  Outcome: Ongoing - Unchanged  Goal: Optimal Wound Healing  Outcome: Ongoing - Unchanged     Problem: Fall Injury Risk  Goal: Absence of Fall and Fall-Related Injury  Outcome: Ongoing - Unchanged     Problem: Skin Injury Risk Increased  Goal: Skin Health and Integrity  Outcome: Ongoing - Unchanged

## 2022-05-06 NOTE — Unmapped (Addendum)
Colin Hunter is a 52 y.o. 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 for transcatheter pulmonary valve replacement.      Severe pulmonary insufficiency s/p pulmonary valve replacement  - RV dilation and failure - HFrEF (EF >55% Nov 2023)  History of congenital pulmonary valve stenosis s/p surgical valvotomy as a child that has been complicated by chronic moderate to severe pulmonary valve insufficiency and dilated RV with reduced function. Patient is s/p uncomplicated placement of pulmonary valve replacement (25 mm Harmony) 1/25. Access was obtained through the R fem vein, L fem vein, and L fem artery and these sites were monitored without concern for bleeding or hematoma. He received three doses of Ancef for prophylaxis. He remained on bedrest overnight following the procedure. He was able to ambulate without difficulty the following morning and had the right groin suture removed prior to discharge. CXR and echocardiogram the morning after the procedure demonstrated that the valve was properly in place and demonstrated no significant pulmonary regurgitation. He was initially going to be discharged home with a ZioPatch, however remained in the hospital for an additional 7 days on telemetry after expected discharge, taking the place of the ZioPatch. Patient will follow-up with the Adult Congenital Valve team in one month.    AKI on CKD Stage 3 - Hyperkalemia - Metabolic Acidosis - Hyponatremia  Patient with history of known CKD Stage 3. Creatinine was mildly elevated from baseline on admission (3.43) with acute increase to 3.65 the following morning. Given concern for contrast-induced nephropathy he remained admitted for observation. Prior to 1/28, patient had minimal urine output over the previous days that was concerning for the need for acute dialysis. Cr peaked at 7.54 on 1/29 and has continued to downtrend since then. He had significant urine output with aggressive IV diuresis and then continued to auto-diurese after diuretics were held for several days. Patient was hyponatremic that was believed to be secondary to volume overload in the setting of having decreased urine output, and this improved with improvement of his urine output. Other abnormalities including hyperkalemia and metabolic acidosis that were likely due to the acute kidney injury were managed with Lokelma and sodium bicarbonate with appropriate improvements. He will have close follow-up with nephrology that is tentatively scheduled for 2/6 at [facility].    Hypertension  Blood pressure not well-controlled while admitted and home lisinopril was held given AKI. Started nifedipine 30 mg daily with appropriate control of blood pressure. Will continue to hold lisinopril on discharge and have patient follow-up with his PCP for additional blood pressure management.

## 2022-05-07 LAB — CBC
HEMATOCRIT: 37.7 % — ABNORMAL LOW (ref 39.0–48.0)
HEMOGLOBIN: 13 g/dL (ref 12.9–16.5)
MEAN CORPUSCULAR HEMOGLOBIN CONC: 34.5 g/dL (ref 32.0–36.0)
MEAN CORPUSCULAR HEMOGLOBIN: 29.1 pg (ref 25.9–32.4)
MEAN CORPUSCULAR VOLUME: 84.2 fL (ref 77.6–95.7)
MEAN PLATELET VOLUME: 9.1 fL (ref 6.8–10.7)
PLATELET COUNT: 74 10*9/L — ABNORMAL LOW (ref 150–450)
RED BLOOD CELL COUNT: 4.47 10*12/L (ref 4.26–5.60)
RED CELL DISTRIBUTION WIDTH: 14 % (ref 12.2–15.2)
WBC ADJUSTED: 9.2 10*9/L (ref 3.6–11.2)

## 2022-05-07 LAB — BASIC METABOLIC PANEL
ANION GAP: 10 mmol/L (ref 5–14)
ANION GAP: 11 mmol/L (ref 5–14)
BLOOD UREA NITROGEN: 65 mg/dL — ABNORMAL HIGH (ref 9–23)
BLOOD UREA NITROGEN: 50 mg/dL — ABNORMAL HIGH (ref 9–23)
BUN / CREAT RATIO: 11
BUN / CREAT RATIO: 8
CALCIUM: 8.3 mg/dL — ABNORMAL LOW (ref 8.7–10.4)
CALCIUM: 8.4 mg/dL — ABNORMAL LOW (ref 8.7–10.4)
CHLORIDE: 100 mmol/L (ref 98–107)
CHLORIDE: 100 mmol/L (ref 98–107)
CO2: 17 mmol/L — ABNORMAL LOW (ref 20.0–31.0)
CO2: 14 mmol/L — ABNORMAL LOW (ref 20.0–31.0)
CREATININE: 6.03 mg/dL — ABNORMAL HIGH
CREATININE: 6.42 mg/dL — ABNORMAL HIGH
EGFR CKD-EPI (2021) MALE: 11 mL/min/{1.73_m2} — ABNORMAL LOW (ref >=60)
EGFR CKD-EPI (2021) MALE: 10 mL/min/{1.73_m2} — ABNORMAL LOW (ref >=60)
GLUCOSE RANDOM: 304 mg/dL — ABNORMAL HIGH (ref 70–179)
GLUCOSE RANDOM: 171 mg/dL (ref 70–179)
POTASSIUM: 4.7 mmol/L (ref 3.4–4.8)
POTASSIUM: 5.2 mmol/L — ABNORMAL HIGH (ref 3.4–4.8)
SODIUM: 127 mmol/L — ABNORMAL LOW (ref 135–145)
SODIUM: 125 mmol/L — ABNORMAL LOW (ref 135–145)

## 2022-05-07 LAB — MAGNESIUM: MAGNESIUM: 1.8 mg/dL (ref 1.6–2.6)

## 2022-05-07 MED ADMIN — isosorbide dinitrate (ISORDIL) tablet 20 mg: 20 mg | ORAL | @ 10:00:00

## 2022-05-07 MED ADMIN — insulin lispro (HumaLOG) injection 26 Units: 26 [IU] | SUBCUTANEOUS | @ 13:00:00

## 2022-05-07 MED ADMIN — acetaminophen (TYLENOL) tablet 650 mg: 650 mg | ORAL | @ 21:00:00

## 2022-05-07 MED ADMIN — pregabalin (LYRICA) capsule 100 mg: 100 mg | ORAL | @ 01:00:00

## 2022-05-07 MED ADMIN — acetaminophen (TYLENOL) tablet 650 mg: 650 mg | ORAL | @ 06:00:00

## 2022-05-07 MED ADMIN — insulin glargine (LANTUS) injection 20 Units: 20 [IU] | SUBCUTANEOUS | @ 03:00:00

## 2022-05-07 MED ADMIN — pregabalin (LYRICA) capsule 100 mg: 100 mg | ORAL | @ 13:00:00

## 2022-05-07 MED ADMIN — empagliflozin (JARDIANCE) tablet 10 mg: 10 mg | ORAL | @ 14:00:00

## 2022-05-07 MED ADMIN — apixaban (ELIQUIS) tablet 5 mg: 5 mg | ORAL | @ 01:00:00

## 2022-05-07 MED ADMIN — apixaban (ELIQUIS) tablet 5 mg: 5 mg | ORAL | @ 13:00:00

## 2022-05-07 MED ADMIN — lactated Ringers infusion: 100 mL/h | INTRAVENOUS | @ 09:00:00 | Stop: 2022-05-07

## 2022-05-07 MED ADMIN — insulin lispro (HumaLOG) injection 26 Units: 26 [IU] | SUBCUTANEOUS | @ 19:00:00

## 2022-05-07 MED ADMIN — isosorbide dinitrate (ISORDIL) tablet 20 mg: 20 mg | ORAL | @ 17:00:00

## 2022-05-07 MED ADMIN — pregabalin (LYRICA) capsule 100 mg: 100 mg | ORAL | @ 19:00:00

## 2022-05-07 MED ADMIN — folic acid (FOLVITE) tablet 1 mg: 1 mg | ORAL | @ 13:00:00

## 2022-05-07 MED ADMIN — atorvastatin (LIPITOR) tablet 20 mg: 20 mg | ORAL | @ 01:00:00

## 2022-05-07 MED ADMIN — isosorbide dinitrate (ISORDIL) tablet 20 mg: 20 mg | ORAL | @ 22:00:00

## 2022-05-07 MED ADMIN — insulin glargine (LANTUS) injection 20 Units: 20 [IU] | SUBCUTANEOUS | @ 13:00:00

## 2022-05-07 MED ADMIN — pantoprazole (Protonix) EC tablet 40 mg: 40 mg | ORAL | @ 13:00:00

## 2022-05-07 NOTE — Unmapped (Signed)
VSS, RA , SR on the monitor , on IVF , ambulates independently , C/O head ache , medicated with tylenol , for possible discharge in am   Problem: Adult Inpatient Plan of Care  Goal: Plan of Care Review  Outcome: Progressing  Goal: Patient-Specific Goal (Individualized)  Outcome: Progressing  Goal: Absence of Hospital-Acquired Illness or Injury  Outcome: Progressing  Goal: Optimal Comfort and Wellbeing  Outcome: Progressing  Goal: Readiness for Transition of Care  Outcome: Progressing  Goal: Rounds/Family Conference  Outcome: Progressing     Problem: Skin Injury Risk Increased  Goal: Skin Health and Integrity  Outcome: Progressing  Intervention: Optimize Skin Protection  Recent Flowsheet Documentation  Taken 05/06/2022 2000 by Wylene Simmer, RN  Activity Management: up ad lib     Problem: Fall Injury Risk  Goal: Absence of Fall and Fall-Related Injury  Outcome: Progressing

## 2022-05-07 NOTE — Unmapped (Signed)
Cardiology - Team 2 [facility]) Progress Note    Assessment & Plan:   Colin Hunter is a 52 y.o. 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     Principal Problem:    Severe pulmonary valve insufficiency  Active Problems:    Atrial fibrillation (CMS-HCC)    Chronic myeloid leukemia (CMS-HCC)    CKD (chronic kidney disease) stage 3, GFR 30-59 ml/min (CMS-HCC)    Congenital pulmonary valve stenosis    Essential hypertension    Gastroesophageal reflux disease    Heart failure with reduced ejection fraction (CMS-HCC)    Type 2 diabetes mellitus treated with insulin (CMS-HCC)  Resolved Problems:    * No resolved hospital problems. *    Active Problems    AKI on CKD  His baseline creatinine is 2.5-3. Cr 3.43 on admission but continuing to rise in the context of recent large contrast load. Creatinine now 6 with urine output significantly decreased. Given ongoing significant injury we will involve nephrology to guide our management. We will continue with supportive care.  - Nephrology consult, appreciate evaluation  - Avoid nephrotoxic agents  - Renally dose medications  - Trend BMP   - Receiving LR 100cc/hr  - Consider additional IVF as needed   - Encourage PO intake  - Strict intake/output     Severe pulmonary insufficiency s/p pulmonary valve replacement  - RV dilation and failure - HFrEF (EF >55% Nov 2023)  History of congenital pulmonary valve stenosis s/p surgical valvotomy as a child that has been complicated by chronic moderate to severe pulmonary valve insufficiency and dilated RV with reduced function. Patient is s/p placement of pulmonary valve replacement (25 mm Harmony) 1/25 and has been recovering appropriately. Routine post-op care maintained and follow up imaging shows adequate function of the prosthesis.   - Continue home apixaban 5mg  BID today   - Continue telemetry   - Ziopatch prior to discharge      Chronic Problems  CML: Hold inpatient asciminib 40 mg BID, if longer hospitalization will need hematology to order  HTN: isordil 20 mg TID  HLD: continue rosuvastatin 20 mg daily  T2DM: at home taking 40 units Lantus BID, 26 units Lispro TID, and Jardiance. Continuing Jardiance 10 mg daily, 26 units Lispro with meals, and will half Lantus to 20 units BID.  Atrial Flutter: Continue metoprolol tartrate 25 mg daily, apixaban 5 mg BID     Issues Impacting Complexity of Management:  -The patient is at high risk of complications from contrast administration in existing CKD    Daily Checklist:  Diet: Regular Diet  DVT PPx: Patient Already on Full Anticoagulation with apixaban  Electrolytes: No Repletion Needed  Code Status: Full Code  Dispo:  Continue floor care. Discharge postponed given evidence of ongoing renal injury, likely to be exacerbated     Team Contact Information:   Primary Team: Cardiology - Team 2 [facility])  Primary Resident: Colin Koller, MD  Resident's Pager: [phone] (Cardiology Team 2 Senior Resident)    Interval History:   No acute events overnight. He continues to feel well. He does note significant decrease in urine output today. Otherwise he notes no new symptoms. His groin site is not causing significant pain.  All other systems were reviewed and are negative except as noted in the HPI    Objective:   Temp:  [36.8 ??C (98.2 ??F)-36.9 ??C (98.4 ??F)] 36.9 ??C (98.4 ??F)  Heart Rate:  [77-90] 88  Resp:  [16-18] 18  BP: (132-183)/(54-91) 174/91  SpO2:  [96 %-100 %] 100 %    Gen: NAD, converses appropriately, partner at bedside  HEENT: atraumatic, normocephalic, EOM grossly intact, pupils equal  Heart: RRR  Lungs: CTAB, no crackles or wheezes  Abdomen: soft, NTND, groin access clean/dry/intact, R femoral suture removed  Extremities: No peripheral edema    Ocie Doyne, MD  [facility] Internal Medicine - PGY-3  Pager: [phone]

## 2022-05-07 NOTE — Unmapped (Addendum)
VENOUS ACCESS ULTRASOUND PROCEDURE NOTE    Indications:   Poor venous access.    The Venous Access Team has assessed this patient for the placement of a PIV. Ultrasound guidance was necessary to obtain access.     Procedure Details:  Identity of the patient was confirmed via name, medical record number and date of birth. The availability of the correct equipment was verified.    The vein was identified for ultrasound catheter insertion.  Field was prepared with necessary supplies and equipment.  Probe cover and sterile gel utilized.  Insertion site was prepped with chlorhexidine solution and allowed to dry.  The catheter extension was primed with normal saline. A(n) 22 gauge 1.75 catheter was placed in the R Forearm with 1attempt(s). See LDA for additional details.    Catheter aspirated, 5 mL blood return present. The catheter was then flushed with 20 mL of normal saline. Insertion site cleansed, and dressing applied per manufacturer guidelines. The catheter was inserted without difficultyby Lorelle Formosa, RN.      RN was notified.     Thank you,     Lorelle Formosa, RN Venous Access Team   [phone]     Workup / Procedure Time:  30 minutes    Unit ultrasound used, no image obtained.

## 2022-05-07 NOTE — Unmapped (Signed)
Pt admitted for pulmonary valve replacement on 1/25 - complicated by AKI. Cr at 6.03 this AM. LR running at 157mL/hr continuously per order. Pt has been drinking a lot of fluid and is anxious about going home; disappointed having to stay here, but pt and significant other understands the reason. Pt has not peed today yet, MD aware, bladder scan has at 1722. No edema in legs this AM, edema now is +1 bilat lower extremities. Pt states legs are tighter than usual - MD has been made aware of these changes. Tylenol given x1 for HA - HA slowly going away, pt does not want any extra intervention at this time. All needs met at this time. Up ad lib independently. Call bell within reach. Resting comfortably in the chair.     Problem: Adult Inpatient Plan of Care  Goal: Plan of Care Review  Outcome: Not Progressing  Goal: Patient-Specific Goal (Individualized)  Outcome: Not Progressing  Goal: Absence of Hospital-Acquired Illness or Injury  Outcome: Not Progressing  Intervention: Identify and Manage Fall Risk  Recent Flowsheet Documentation  Taken 05/07/2022 0800 by Maggie Schwalbe, RN  Safety Interventions:   environmental modification   fall reduction program maintained   lighting adjusted for tasks/safety   low bed   nonskid shoes/slippers when out of bed  Intervention: Prevent Skin Injury  Recent Flowsheet Documentation  Taken 05/07/2022 0800 by Maggie Schwalbe, RN  Positioning for Skin: Sitting in Chair  Goal: Optimal Comfort and Wellbeing  Outcome: Not Progressing  Goal: Readiness for Transition of Care  Outcome: Not Progressing  Goal: Rounds/Family Conference  Outcome: Not Progressing     Problem: Wound  Goal: Optimal Functional Ability  Outcome: Not Progressing  Intervention: Optimize Functional Ability  Recent Flowsheet Documentation  Taken 05/07/2022 0800 by Maggie Schwalbe, RN  Activity Management: ambulated outside room  Goal: Absence of Infection Signs and Symptoms  Outcome: Not Progressing  Goal: Improved Oral Intake  Outcome: Not Progressing  Goal: Optimal Pain Control and Function  Outcome: Not Progressing  Goal: Skin Health and Integrity  Outcome: Not Progressing  Intervention: Optimize Skin Protection  Recent Flowsheet Documentation  Taken 05/07/2022 0800 by Maggie Schwalbe, RN  Activity Management: ambulated outside room  Pressure Reduction Techniques: frequent weight shift encouraged  Head of Bed (HOB) Positioning: HOB at 20-30 degrees  Pressure Reduction Devices:   pressure-redistributing mattress utilized   specialty bed utilized  Goal: Optimal Wound Healing  Outcome: Not Progressing

## 2022-05-07 NOTE — Unmapped (Signed)
Pt admitted for TPVR. Safeguard and stitches in place this AM - were taken off/out by MD late morning. Bilat groin sites now dressed with gauze and tegaderm, clean, dry, and intact. Pt did not void until about 1500. Bladder scans were always less than . After 1L LR bolus, pt was able to void , post void scan was 52mL - MD made aware. After bolus, scr went up and MD decided to keep pt overnight. Reported this to pt and his wife, they are upset about not going home tonight but understands why. Hoping to go home tomorrow. All needs met at this time. Call bell within reach.     Problem: Adult Inpatient Plan of Care  Goal: Plan of Care Review  Outcome: Progressing  Goal: Patient-Specific Goal (Individualized)  Outcome: Progressing  Goal: Absence of Hospital-Acquired Illness or Injury  Outcome: Progressing  Intervention: Identify and Manage Fall Risk  Recent Flowsheet Documentation  Taken 05/06/2022 0800 by Maggie Schwalbe, RN  Safety Interventions:   environmental modification   fall reduction program maintained   lighting adjusted for tasks/safety   low bed   nonskid shoes/slippers when out of bed  Intervention: Prevent Skin Injury  Recent Flowsheet Documentation  Taken 05/06/2022 0800 by Maggie Schwalbe, RN  Positioning for Skin: Left  Goal: Optimal Comfort and Wellbeing  Outcome: Progressing  Goal: Readiness for Transition of Care  Outcome: Progressing  Goal: Rounds/Family Conference  Outcome: Progressing     Problem: Skin Injury Risk Increased  Goal: Skin Health and Integrity  Outcome: Progressing  Intervention: Optimize Skin Protection  Recent Flowsheet Documentation  Taken 05/06/2022 0800 by Maggie Schwalbe, RN  Activity Management: up ad lib  Pressure Reduction Techniques: frequent weight shift encouraged  Head of Bed (HOB) Positioning: HOB at 20-30 degrees  Pressure Reduction Devices:   pressure-redistributing mattress utilized   specialty bed utilized     Problem: Fall Injury Risk  Goal: Absence of Fall and Fall-Related Injury  Outcome: Progressing  Intervention: Promote Scientist, clinical (histocompatibility and immunogenetics) Documentation  Taken 05/06/2022 0800 by Maggie Schwalbe, RN  Safety Interventions:   environmental modification   fall reduction program maintained   lighting adjusted for tasks/safety   low bed   nonskid shoes/slippers when out of bed     Problem: Wound  Goal: Optimal Coping  Outcome: Progressing  Goal: Optimal Functional Ability  Outcome: Progressing  Intervention: Optimize Functional Ability  Recent Flowsheet Documentation  Taken 05/06/2022 0800 by Maggie Schwalbe, RN  Activity Management: up ad lib  Goal: Absence of Infection Signs and Symptoms  Outcome: Progressing  Goal: Improved Oral Intake  Outcome: Progressing  Goal: Optimal Pain Control and Function  Outcome: Progressing  Goal: Skin Health and Integrity  Outcome: Progressing  Intervention: Optimize Skin Protection  Recent Flowsheet Documentation  Taken 05/06/2022 0800 by Maggie Schwalbe, RN  Activity Management: up ad lib  Pressure Reduction Techniques: frequent weight shift encouraged  Head of Bed (HOB) Positioning: HOB at 20-30 degrees  Pressure Reduction Devices:   pressure-redistributing mattress utilized   specialty bed utilized  Goal: Optimal Wound Healing  Outcome: Progressing

## 2022-05-07 NOTE — Unmapped (Signed)
Cardiology - Team 2 [facility]) Progress Note    Assessment & Plan:   Colin Hunter is a 52 y.o. 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     Principal Problem:    Severe pulmonary valve insufficiency  Active Problems:    Atrial fibrillation (CMS-HCC)    Chronic myeloid leukemia (CMS-HCC)    CKD (chronic kidney disease) stage 3, GFR 30-59 ml/min (CMS-HCC)    Congenital pulmonary valve stenosis    Essential hypertension    Gastroesophageal reflux disease    Heart failure with reduced ejection fraction (CMS-HCC)    Type 2 diabetes mellitus treated with insulin (CMS-HCC)  Resolved Problems:    * No resolved hospital problems. *    Active Problems    Severe pulmonary insufficiency s/p pulmonary valve replacement  - RV dilation and failure - HFrEF (EF >55% Nov 2023)  History of congenital pulmonary valve stenosis s/p surgical valvotomy as a child that has been complicated by chronic moderate to severe pulmonary valve insufficiency and dilated RV with reduced function. Patient is s/p placement of pulmonary valve replacement (25 mm Harmony) 1/25 and has been recovering appropriately. Routine post-op care maintained and follow up imaging shows adequate function of the prosthesis.   - Restart home apixaban 5mg  BID today   - TOV today   - R groin suture removal today   - Continue telemetry   - Ziopatch prior to discharge   - CBC, BMP, Mg in AM     AKI on CKD  His baseline creatinine is 2.5-3. Cr 3.43 on admission but steadily rising today. Likely multifactorial, though given large contrast load from procedure I do anticipate that this will worsen over the next 24-48 hours. Given this change it is prudent to continue with close monitoring and supportive care and we will hold off on discharge at this time.   - Avoid nephrotoxic agents  - Renally dose medications  - Trend BMP   - Give 1L LR  - Consider additional IVF as needed     Chronic Problems  CML: Hold inpatient asciminib 40 mg BID, if longer hospitalization will need hematology to order  HTN: isordil 20 mg TID  HLD: continue rosuvastatin 20 mg daily  T2DM: at home taking 40 units Lantus BID, 26 units Lispro TID, and Jardiance. Continuing Jardiance 10 mg daily, 26 units Lispro with meals, and will half Lantus to 20 units BID.  Atrial Flutter: Continue metoprolol tartrate 25 mg daily, apixaban 5 mg BID     Issues Impacting Complexity of Management:  -The patient is at high risk of complications from contrast administration in existing CKD    Daily Checklist:  Diet: Regular Diet  DVT PPx: Patient Already on Full Anticoagulation with apixaban  Electrolytes: No Repletion Needed  Code Status: Full Code  Dispo:  Continue floor care. Discharge postponed given evidence of ongoing renal injury, likely to be exacerbated     Team Contact Information:   Primary Team: Cardiology - Team 2 [facility])  Primary Resident: Mills Koller, MD, MD  Resident's Pager: [phone] (Cardiology Team 2 Senior Resident)    Interval History:   No acute events overnight. He is feeling well today and has no concerns. Hoping for  discharge today but understands monitoring needed for kidney function.     All other systems were reviewed and are negative except as noted in the HPI    Objective:   Temp:  [36.4 ??C (97.5 ??F)-36.8 ??C (98.2 ??F)] 36.8 ??C (98.2 ??F)  Heart Rate:  [67-77] 77  Resp:  [18] 18  BP: (124-174)/(72-92) 174/88  SpO2:  [93 %-100 %] 96 %    Gen: NAD, converses appropriately, partner at bedside  HEENT: atraumatic, normocephalic, EOM grossly intact, pupils equal  Heart: RRR  Lungs: CTAB, no crackles or wheezes  Abdomen: soft, NTND, groin access clean/dry/intact, R femoral suture removed  Extremities: No peripheral edema    Ocie Doyne, MD  [facility] Internal Medicine - PGY-3  Pager: [phone]

## 2022-05-08 LAB — URINALYSIS WITH MICROSCOPY
BACTERIA: NONE SEEN /HPF
BILIRUBIN UA: NEGATIVE
GLUCOSE UA: 300 — AB
KETONES UA: NEGATIVE
LEUKOCYTE ESTERASE UA: NEGATIVE
NITRITE UA: NEGATIVE
PH UA: 5.5 (ref 5.0–9.0)
PROTEIN UA: 200 — AB
RBC UA: 3 /HPF (ref <=3)
SPECIFIC GRAVITY UA: 1.01 (ref 1.003–1.030)
SQUAMOUS EPITHELIAL: <1 /HPF (ref 0–5)
UROBILINOGEN UA: <2
WBC UA: 1 /HPF (ref <=2)

## 2022-05-08 LAB — BASIC METABOLIC PANEL
ANION GAP: 11 mmol/L (ref 5–14)
ANION GAP: 11 mmol/L (ref 5–14)
BLOOD UREA NITROGEN: 69 mg/dL — ABNORMAL HIGH (ref 9–23)
BLOOD UREA NITROGEN: 46 mg/dL — ABNORMAL HIGH (ref 9–23)
BUN / CREAT RATIO: 10
BUN / CREAT RATIO: 6
CALCIUM: 8.3 mg/dL — ABNORMAL LOW (ref 8.7–10.4)
CALCIUM: 8.5 mg/dL — ABNORMAL LOW (ref 8.7–10.4)
CHLORIDE: 99 mmol/L (ref 98–107)
CHLORIDE: 100 mmol/L (ref 98–107)
CO2: 15 mmol/L — ABNORMAL LOW (ref 20.0–31.0)
CO2: 13 mmol/L — ABNORMAL LOW (ref 20.0–31.0)
CREATININE: 7.21 mg/dL — ABNORMAL HIGH
CREATININE: 7.27 mg/dL — ABNORMAL HIGH
EGFR CKD-EPI (2021) MALE: 9 mL/min/{1.73_m2} — ABNORMAL LOW (ref >=60)
EGFR CKD-EPI (2021) MALE: 8 mL/min/{1.73_m2} — ABNORMAL LOW (ref >=60)
GLUCOSE RANDOM: 174 mg/dL — ABNORMAL HIGH (ref 70–99)
GLUCOSE RANDOM: 177 mg/dL (ref 70–179)
POTASSIUM: 5.2 mmol/L — ABNORMAL HIGH (ref 3.4–4.8)
POTASSIUM: 5.4 mmol/L — ABNORMAL HIGH (ref 3.4–4.8)
SODIUM: 125 mmol/L — ABNORMAL LOW (ref 135–145)
SODIUM: 124 mmol/L — ABNORMAL LOW (ref 135–145)

## 2022-05-08 LAB — CBC
HEMATOCRIT: 38.6 % — ABNORMAL LOW (ref 39.0–48.0)
HEMOGLOBIN: 13.5 g/dL (ref 12.9–16.5)
MEAN CORPUSCULAR HEMOGLOBIN CONC: 35 g/dL (ref 32.0–36.0)
MEAN CORPUSCULAR HEMOGLOBIN: 29.5 pg (ref 25.9–32.4)
MEAN CORPUSCULAR VOLUME: 84.2 fL (ref 77.6–95.7)
MEAN PLATELET VOLUME: 9.8 fL (ref 6.8–10.7)
PLATELET COUNT: 73 10*9/L — ABNORMAL LOW (ref 150–450)
RED BLOOD CELL COUNT: 4.58 10*12/L (ref 4.26–5.60)
RED CELL DISTRIBUTION WIDTH: 13.9 % (ref 12.2–15.2)
WBC ADJUSTED: 10.1 10*9/L (ref 3.6–11.2)

## 2022-05-08 LAB — MAGNESIUM: MAGNESIUM: 1.9 mg/dL (ref 1.6–2.6)

## 2022-05-08 MED ADMIN — empagliflozin (JARDIANCE) tablet 10 mg: 10 mg | ORAL | @ 14:00:00

## 2022-05-08 MED ADMIN — folic acid (FOLVITE) tablet 1 mg: 1 mg | ORAL | @ 14:00:00

## 2022-05-08 MED ADMIN — pantoprazole (Protonix) EC tablet 40 mg: 40 mg | ORAL | @ 14:00:00

## 2022-05-08 MED ADMIN — pregabalin (LYRICA) capsule 100 mg: 100 mg | ORAL | @ 02:00:00

## 2022-05-08 MED ADMIN — insulin lispro (HumaLOG) injection 26 Units: 26 [IU] | SUBCUTANEOUS | @ 20:00:00

## 2022-05-08 MED ADMIN — sodium zirconium cyclosilicate (LOKELMA) packet 10 g: 10 g | ORAL | @ 20:00:00

## 2022-05-08 MED ADMIN — diclofenac sodium (VOLTAREN) 1 % gel 2 g: 2 g | TOPICAL | @ 18:00:00

## 2022-05-08 MED ADMIN — furosemide (LASIX) 120 mg in sodium chloride (NS) 0.9 % 50 mL IVPB: 120 mg | INTRAVENOUS | @ 12:00:00 | Stop: 2022-05-08

## 2022-05-08 MED ADMIN — furosemide (LASIX) 120 mg in sodium chloride (NS) 0.9 % 50 mL IVPB: 120 mg | INTRAVENOUS | @ 20:00:00

## 2022-05-08 MED ADMIN — insulin glargine (LANTUS) injection 20 Units: 20 [IU] | SUBCUTANEOUS | @ 02:00:00

## 2022-05-08 MED ADMIN — atorvastatin (LIPITOR) tablet 20 mg: 20 mg | ORAL | @ 02:00:00

## 2022-05-08 MED ADMIN — acetaminophen (TYLENOL) tablet 650 mg: 650 mg | ORAL | @ 04:00:00

## 2022-05-08 MED ADMIN — insulin glargine (LANTUS) injection 20 Units: 20 [IU] | SUBCUTANEOUS | @ 16:00:00

## 2022-05-08 MED ADMIN — insulin lispro (HumaLOG) injection 26 Units: 26 [IU] | SUBCUTANEOUS | @ 16:00:00

## 2022-05-08 MED ADMIN — apixaban (ELIQUIS) tablet 5 mg: 5 mg | ORAL | @ 14:00:00

## 2022-05-08 MED ADMIN — sodium bicarbonate tablet 1,300 mg: 1300 mg | ORAL | @ 20:00:00

## 2022-05-08 MED ADMIN — diclofenac sodium (VOLTAREN) 1 % gel 2 g: 2 g | TOPICAL | @ 07:00:00

## 2022-05-08 MED ADMIN — isosorbide dinitrate (ISORDIL) tablet 20 mg: 20 mg | ORAL | @ 18:00:00

## 2022-05-08 MED ADMIN — lidocaine 4 % patch 1 patch: 1 | TRANSDERMAL | @ 14:00:00

## 2022-05-08 MED ADMIN — isosorbide dinitrate (ISORDIL) tablet 20 mg: 20 mg | ORAL | @ 23:00:00

## 2022-05-08 MED ADMIN — diclofenac sodium (VOLTAREN) 1 % gel 2 g: 2 g | TOPICAL | @ 23:00:00

## 2022-05-08 MED ADMIN — isosorbide dinitrate (ISORDIL) tablet 20 mg: 20 mg | ORAL | @ 10:00:00

## 2022-05-08 MED ADMIN — diclofenac sodium (VOLTAREN) 1 % gel 2 g: 2 g | TOPICAL | @ 10:00:00

## 2022-05-08 MED ADMIN — pregabalin (LYRICA) capsule 100 mg: 100 mg | ORAL | @ 18:00:00

## 2022-05-08 MED ADMIN — oxyCODONE (ROXICODONE) immediate release tablet 5 mg: 5 mg | ORAL | @ 05:00:00 | Stop: 2022-05-08

## 2022-05-08 MED ADMIN — apixaban (ELIQUIS) tablet 5 mg: 5 mg | ORAL | @ 02:00:00

## 2022-05-08 MED ADMIN — pregabalin (LYRICA) capsule 100 mg: 100 mg | ORAL | @ 14:00:00

## 2022-05-08 MED ADMIN — insulin lispro (HumaLOG) injection 26 Units: 26 [IU] | SUBCUTANEOUS | @ 02:00:00

## 2022-05-08 NOTE — Unmapped (Signed)
Nephrology Consult Note    Requesting Attending Physician :  Meredith Leeds, MD  Service Requesting Consult : Cardiology [facility])  Reason for Consult: AKI on CKD     Assessment and Plan:  [name] is a 52 yo with congenital pulmonary valve stenosis, CKDIII who is s/p transcatheter pulmonary valve placement on 1/25. Course c/b AKI.     # AKI on CKD: BL Cr 2.3 - 2.8 scheduled to establish with [facility] nephrology early next month. This hospitalization he has been noted to have a rapid rise in Cr since pulmonic valve replacement on 1/25. Timing of rising Cr appears to correlate with large contrast load. He continues to slowly worsen, although without overt clinical symptoms.  - Care for contrast nephropathy is otherwise supportive with goal to maintain euvolemia, MAP >65, and avoid nephrotoxic agents as able.   - Follow-up scheduled with nephrology  - We discussed at length today that he may require dialysis in the next 24-36 hours if he continues to proceed along this trajectory. He was okay, though obviously disappointed, with this news. Would expect and hope that he would recover, though with underlying CKD, this is somewhat less predictable and the time course unknown - again, would hope would only be a matter of days. Will continue to manage medically as well as possible. Recommendations below    # Hyponatremia, stable to slightly worse: To 127. Suspect this reflects some degree of volume overload with dropping UOP.     # Metabolic acidosis:   - trend for now, will add sodium bicarbonate if persistently <20    #Hyperkalemia, worsening: Secondary to AKI, above.    # Pulmonic valve insufficiency  - S/p valve replacement 1/25    - Evaluation and management per primary team  - No changes to management from a nephrology standpoint at this time    RECOMMENDATIONS:   - Sodium bicarb 1300 mg TID  - Lokelma 10 g BID  - Agree with continued IV diuretics, 120 mg IV Lasix BID given that he is mildly overloaded.  - We will continue to follow.     Ernestina Columbia, MD  05/08/2022 12:53 PM     Medical decision-making for 05/08/22  Findings / Data     Patient has: []  acute illness w/systemic sxs  [mod]  []  two or more stable chronic illnesses [mod]  []  one chronic illness with acute exacerbation [mod]  []  acute complicated illness  [mod]  []  Undiagnosed new problem with uncertain prognosis  [mod] [x]  illness posing risk to life or bodily function (ex. AKI)  [high]  []  chronic illness with severe exacerbation/progression  [high]  []  chronic illness with severe side effects of treatment  [high] AKI, hyponatremia  Probs At least 2:  Probs, Data, Risk   I reviewed: [x]  primary team note  []  consultant note(s)  []  external records [x]  chemistry results  []  CBC results  []  blood gas results  []  Other []  procedure/op note(s)   [x]  radiology report(s)  []  micro result(s)  []  w/ independent historian(s) Hyponatremia, elevated Cr.. Pulmonic valve op note and TTE read reviewed  ?3 Data Review (2 of 3)    I independently interpreted: []  Urine Sediment  []  Renal US []  CXR Images  []  CT Images  []  Other []  EKG Tracing  Any     I discussed: []  Pathology results w/ QHPs(s) from other specialties  []  Procedural findings w/ QHPs(s) from other specialties []  Imaging w/ QHP(s) from other  specialties  [x]  Treatment plan w/ QHP(s) from other specialties Plan discussed with primary team Any     Mgm't requires: [x]  Prescription drug(s)  [mod]  []  Kidney biopsy  [mod]  []  Central line placement  [mod] [x]  High risk medication use and/or intensive toxicity monitoring [high]  []  Renal replacement therapy [high]  []  High risk kidney biopsy  [high]  []  Escalation of care  [high]  []  High risk central line placement  [high] IV diuretics, lokelma Risk      ____________________________________________________    History of Present Illness: Colin Hunter is 52 y.o. male with CKDIII, pulmonic insufficiency who is seen in consultation at the request of Meredith Leeds, MD and Cardiology [facility]). Nephrology has been consulted for AKI.     Feeling okay today, feels like he has a little more lower extremity edema. Walking around okay. Breathing fine. Making scant urine.    Background:  Pt admitted 05/05/2022 and underwent pulmonic valve replacement. Since  then Cr has rapidly risen from 3.4 (BL 2.3-2.8) to 6 as of time of consult. He reports feeling generally well and better since his valve was replaced. He has noticed he is urinating a lot less. He feels like he can get the urine out. He denies SOB or edema.      INPATIENT MEDICATIONS:    Current Facility-Administered Medications:     acetaminophen (TYLENOL) tablet 650 mg, Oral, Q6H PRN    albuterol (PROVENTIL HFA;VENTOLIN HFA) 90 mcg/actuation inhaler 2 puff, Inhalation, Q6H PRN    apixaban (ELIQUIS) tablet 5 mg, Oral, BID    atorvastatin (LIPITOR) tablet 20 mg, Oral, Nightly    dextrose 50 % in water (D50W) 50 % solution 12.5 g, Intravenous, Q10 Min PRN    diclofenac sodium (VOLTAREN) 1 % gel 2 g, Topical, QID    empagliflozin (JARDIANCE) tablet 10 mg, Oral, Daily    folic acid (FOLVITE) tablet 1 mg, Oral, Daily    glucagon injection 1 mg, Intramuscular, Once PRN    glucose chewable tablet 16 g, Oral, Q10 Min PRN    hydrALAZINE (APRESOLINE) injection 10 mg, Intravenous, Q6H PRN    insulin glargine (LANTUS) injection 20 Units, Subcutaneous, BID    insulin lispro (HumaLOG) injection 26 Units, Subcutaneous, TID AC    isosorbide dinitrate (ISORDIL) tablet 20 mg, Oral, TID    lactated ringers bolus 1,000 mL, Intravenous, Once    lidocaine 4 % patch 1 patch, Transdermal, Daily    pantoprazole (Protonix) EC tablet 40 mg, Oral, Daily    pregabalin (LYRICA) capsule 100 mg, Oral, TID    OUTPATIENT MEDICATIONS:  Prior to Admission medications    Medication Dose, Route, Frequency   apixaban (ELIQUIS) 5 mg Tab 5 mg, Oral, 2 times a day (standard)   asciminib (SCEMBLIX) 40 mg tablet Take 1 tablet (40 mg total) by mouth Two (2) times a day. Take on an empty stomach, at least 1 hour before or 2 hours after a meal. Swallow tablets whole. Do not break, crush, or chew the tablets.   folic acid (FOLVITE) 1 MG tablet 1 mg, Oral, Daily   insulin lispro (HUMALOG) 100 unit/mL injection pen INJECT 26 UNITS THREE TIMES DAILY BEFORE MEAL(S)   JARDIANCE 10 mg tablet 10 mg, Oral, Daily (standard)   metOLazone (ZAROXOLYN) 2.5 MG tablet 2.5 mg, Oral, Daily (standard)   metoprolol succinate (TOPROL-XL) 100 MG 24 hr tablet 100 mg, Oral, Daily (standard)   OZEMPIC 0.25 mg or 0.5 mg(2 mg/1.5 mL) PnIj injection 0.5  mg, Subcutaneous, Every 7 days   pantoprazole (PROTONIX) 40 MG tablet 40 mg, Oral, Daily (standard)   pregabalin (LYRICA) 100 MG capsule 100 mg, Oral, 3 times a day (standard)   torsemide 40 mg Tab 40 mg, Oral, Daily PRN   VENTOLIN HFA 90 mcg/actuation inhaler 2 puffs, Inhalation, Every 6 hours PRN   BINAXNOW COVID-19 AG SELF TEST Kit    blood pressure monitor Kit 1 each, Miscellaneous, Daily   blood sugar diagnostic Strp    blood-glucose meter Misc    insulin glargine (BASAGLAR, LANTUS) 100 unit/mL (3 mL) injection pen 40 Units, Subcutaneous, 2 times a day (standard)   lancets Misc Miscellaneous   ONETOUCH VERIO TEST STRIPS Strp USE 1 STRIP TO CHECK GLUCOSE TWICE DAILY   rosuvastatin (CRESTOR) 20 MG tablet 20 mg, Oral, Daily (standard)        ALLERGIES:  Patient has no known allergies.    MEDICAL HISTORY:  Past Medical History:   Diagnosis Date    CKD (chronic kidney disease) stage 3, GFR 30-59 ml/min (CMS-HCC)     CML (chronic myelocytic leukemia) (CMS-HCC)     HFrEF (heart failure with reduced ejection fraction) (CMS-HCC)     Paroxysmal atrial fibrillation (CMS-HCC)     PFO (patent foramen ovale)     Right atrial thrombus     T2DM (type 2 diabetes mellitus) (CMS-HCC)      Past Surgical History:   Procedure Laterality Date    PR COMPRE EP EVAL ABLTJ 3D MAPG TX SVT N/A 12/03/2020    Procedure: A-Flutter Ablation;  Surgeon: Dorcas Carrow, MD; Location: [facility] EP;  Service: Cardiology    PR RIGHT HEART CATH O2 SATURATION & CARDIAC OUTPUT N/A 11/30/2020    Procedure: Right Heart Catheterization;  Surgeon: Marlaine Hind, MD;  Location: [facility] CATH;  Service: Cardiology    PR TCAT PULMONARY VALVE IMPLANTATION PRQ APPROACH N/A 05/05/2022    Procedure: Peds TPVR;  Surgeon: Charlann Noss, DO;  Location: [facility] PEDS CATH/EP;  Service: Cardiology     SOCIAL HISTORY  Social History     Social History Narrative    Not on file      reports that he quit smoking about 3 years ago. His smoking use included cigarettes. He started smoking about 9 years ago. He has a 3.0 pack-year smoking history. He has never used smokeless tobacco. He reports that he does not drink alcohol and does not use drugs.   FAMILY HISTORY  Family History   Problem Relation Age of Onset    Heart disease Father         Physical Exam:   Vitals:    05/07/22 2353 05/08/22 0111 05/08/22 0400 05/08/22 0851   BP: 179/93  187/99 161/94   Pulse: 95 87  102   Resp: 20   18   Temp: 37 ??C (98.6 ??F)   37.6 ??C (99.7 ??F)   TempSrc: Oral   Oral   SpO2: 98%   96%   Weight:       Height:         I/O this shift:  In: 240 [P.O.:240]  Out: 200 [Urine:200]    Intake/Output Summary (Last 24 hours) at 05/08/2022 1253  Last data filed at 05/08/2022 1142  Gross per 24 hour   Intake 1730 ml   Output 525 ml   Net 1205 ml       Constitutional: well-appearing, no acute distress  Heart: RRR  Lungs: CTAB, normal wob  Abd:  soft, non-tender, non-distended  Ext: 1+ edema

## 2022-05-08 NOTE — Unmapped (Addendum)
Aox4. VSS. Cr. 6.48, gfr 10 with rising K at 5.2, lasix bolus given. output. Back pain flare up, md notified, one time dose of oxy given, ordered Voltaren and lidocaine to prevent further episodes. Independent. +1 edema in BLE. Call bell within reach, denies concerns at this time.      Problem: Adult Inpatient Plan of Care  Goal: Plan of Care Review  Outcome: Progressing  Goal: Patient-Specific Goal (Individualized)  Outcome: Progressing  Goal: Absence of Hospital-Acquired Illness or Injury  Outcome: Progressing  Intervention: Prevent Skin Injury  Recent Flowsheet Documentation  Taken 05/07/2022 2101 by Kateri Mc, RN  Positioning for Skin: Supine/Back  Device Skin Pressure Protection:   absorbent pad utilized/changed   adhesive use limited  Skin Protection:   adhesive use limited   incontinence pads utilized   tubing/devices free from skin contact  Goal: Optimal Comfort and Wellbeing  Outcome: Progressing  Goal: Readiness for Transition of Care  Outcome: Progressing  Goal: Rounds/Family Conference  Outcome: Progressing     Problem: Skin Injury Risk Increased  Goal: Skin Health and Integrity  Outcome: Progressing  Intervention: Optimize Skin Protection  Recent Flowsheet Documentation  Taken 05/07/2022 2101 by Kateri Mc, RN  Pressure Reduction Techniques:   frequent weight shift encouraged   pressure points protected  Pressure Reduction Devices: pressure-redistributing mattress utilized  Skin Protection:   adhesive use limited   incontinence pads utilized   tubing/devices free from skin contact     Problem: Fall Injury Risk  Goal: Absence of Fall and Fall-Related Injury  Outcome: Progressing     Problem: Wound  Goal: Optimal Coping  Outcome: Progressing  Goal: Optimal Functional Ability  Outcome: Progressing  Goal: Absence of Infection Signs and Symptoms  Outcome: Progressing  Goal: Improved Oral Intake  Outcome: Progressing  Goal: Optimal Pain Control and Function  Outcome: Progressing  Goal: Skin Health and Integrity  Outcome: Progressing  Intervention: Optimize Skin Protection  Recent Flowsheet Documentation  Taken 05/07/2022 2101 by Kateri Mc, RN  Pressure Reduction Techniques:   frequent weight shift encouraged   pressure points protected  Pressure Reduction Devices: pressure-redistributing mattress utilized  Skin Protection:   adhesive use limited   incontinence pads utilized   tubing/devices free from skin contact  Goal: Optimal Wound Healing  Outcome: Progressing

## 2022-05-08 NOTE — Unmapped (Signed)
Nephrology Consult Note    Requesting Attending Physician :  Meredith Leeds, MD  Service Requesting Consult : Cardiology [facility])  Reason for Consult: AKI on CKD     Assessment and Plan:  [name] is a 52 yo with congenital pulmonary valve stenosis, CKDIII who is s/p transcatheter pulmonary valve placement on 1/25. Course c/b AKI.     # AKI on CKD: BL Cr 2.3 - 2.8 scheduled to establish with [facility] nephrology early next month. This hospitalization he has been noted to have a rapid rise in Cr since pulmonic valve replacement on 1/25. Timing of rising Cr appears to correlate with large contrast load. Other considerations are obstruction, cardiorenal syndrome.  - Will ideally examine urine for casts and abnormal cells  - Please order renal U/S and trend regular PVR's until able to rule out obstruction  - S/p 1 L IVF today, please recheck a BMP this evening (for hyponatremia, as below).  - Care for contrast nephropathy is otherwise supportive with goal to maintain euvolemia, MAP >65, and avoid nephrotoxic agents as able.   - Follow-up scheduled with nephrology    # Hyponatremia: To 127. Suspect this reflects some degree of volume overload with dropping UOP.   - Please obtain sodium after fluids today to ensure no significant drop   - May need to diurese    # Metabolic acidosis:   - trend for now, will add sodium bicarbonate if persistently <20    # Pulmonic valve insufficiency  - S/p valve replacement 1/25    - Evaluation and management per primary team  - No changes to management from a nephrology standpoint at this time    RECOMMENDATIONS:   - Please order renal U/S and trend regular PVR's until able to rule out obstruction  - Please recheck a BMP this evening   - Will trend acidosis and add sodium bicarbonate if indicated  - No indication for dialysis at this time  - We will continue to follow.     Florian Buff, MD  05/07/2022 4:47 PM     Medical decision-making for 05/07/22  Findings / Data     Patient has: []  acute illness w/systemic sxs  [mod]  []  two or more stable chronic illnesses [mod]  []  one chronic illness with acute exacerbation [mod]  []  acute complicated illness  [mod]  []  Undiagnosed new problem with uncertain prognosis  [mod] [x]  illness posing risk to life or bodily function (ex. AKI)  [high]  []  chronic illness with severe exacerbation/progression  [high]  []  chronic illness with severe side effects of treatment  [high] AKI, hyponatremia  Probs At least 2:  Probs, Data, Risk   I reviewed: [x]  primary team note  []  consultant note(s)  []  external records [x]  chemistry results  []  CBC results  []  blood gas results  []  Other [x]  procedure/op note(s)   [x]  radiology report(s)  []  micro result(s)  []  w/ independent historian(s) Hyponatremia, elevated Cr.. Pulmonic valve op note and TTE read reviewed  ?3 Data Review (2 of 3)    I independently interpreted: []  Urine Sediment  []  Renal US []  CXR Images  []  CT Images  []  Other []  EKG Tracing  Any     I discussed: []  Pathology results w/ QHPs(s) from other specialties  []  Procedural findings w/ QHPs(s) from other specialties []  Imaging w/ QHP(s) from other specialties  [x]  Treatment plan w/ QHP(s) from other specialties Plan discussed with primary team Any  Mgm't requires: [x]  Prescription drug(s)  [mod]  []  Kidney biopsy  [mod]  []  Central line placement  [mod] [x]  High risk medication use and/or intensive toxicity monitoring [high]  []  Renal replacement therapy [high]  []  High risk kidney biopsy  [high]  []  Escalation of care  [high]  []  High risk central line placement  [high] IVF, possible diuretics  Risk      ____________________________________________________    History of Present Illness: Colin Hunter is 52 y.o. male with CKDIII, pulmonic insufficiency who is seen in consultation at the request of Meredith Leeds, MD and Cardiology [facility]). Nephrology has been consulted for AKI.     Pt admitted 05/05/2022 and underwent pulmonic valve replacement. Since  then Cr has rapidly risen from 3.4 (BL 2.3-2.8) to 6 as of time of consult. He reports feeling generally well and better since his valve was replaced. He has noticed he is urinating a lot less. He feels like he can get the urine out. He denies SOB or edema.      INPATIENT MEDICATIONS:    Current Facility-Administered Medications:     acetaminophen (TYLENOL) tablet 650 mg, Oral, Q6H PRN    albuterol (PROVENTIL HFA;VENTOLIN HFA) 90 mcg/actuation inhaler 2 puff, Inhalation, Q6H PRN    apixaban (ELIQUIS) tablet 5 mg, Oral, BID    atorvastatin (LIPITOR) tablet 20 mg, Oral, Nightly    dextrose 50 % in water (D50W) 50 % solution 12.5 g, Intravenous, Q10 Min PRN    empagliflozin (JARDIANCE) tablet 10 mg, Oral, Daily    folic acid (FOLVITE) tablet 1 mg, Oral, Daily    glucagon injection 1 mg, Intramuscular, Once PRN    glucose chewable tablet 16 g, Oral, Q10 Min PRN    hydrALAZINE (APRESOLINE) injection 10 mg, Intravenous, Q6H PRN    insulin glargine (LANTUS) injection 20 Units, Subcutaneous, BID    insulin lispro (HumaLOG) injection 26 Units, Subcutaneous, TID AC    isosorbide dinitrate (ISORDIL) tablet 20 mg, Oral, TID    lactated ringers bolus 1,000 mL, Intravenous, Once    lactated Ringers infusion, Intravenous, Continuous    pantoprazole (Protonix) EC tablet 40 mg, Oral, Daily    pregabalin (LYRICA) capsule 100 mg, Oral, TID    OUTPATIENT MEDICATIONS:  Prior to Admission medications    Medication Dose, Route, Frequency   apixaban (ELIQUIS) 5 mg Tab 5 mg, Oral, 2 times a day (standard)   asciminib (SCEMBLIX) 40 mg tablet Take 1 tablet (40 mg total) by mouth Two (2) times a day. Take on an empty stomach, at least 1 hour before or 2 hours after a meal. Swallow tablets whole. Do not break, crush, or chew the tablets.   folic acid (FOLVITE) 1 MG tablet 1 mg, Oral, Daily   insulin lispro (HUMALOG) 100 unit/mL injection pen INJECT 26 UNITS THREE TIMES DAILY BEFORE MEAL(S)   JARDIANCE 10 mg tablet 10 mg, Oral, Daily (standard)   metOLazone (ZAROXOLYN) 2.5 MG tablet 2.5 mg, Oral, Daily (standard)   metoprolol succinate (TOPROL-XL) 100 MG 24 hr tablet 100 mg, Oral, Daily (standard)   OZEMPIC 0.25 mg or 0.5 mg(2 mg/1.5 mL) PnIj injection 0.5 mg, Subcutaneous, Every 7 days   pantoprazole (PROTONIX) 40 MG tablet 40 mg, Oral, Daily (standard)   pregabalin (LYRICA) 100 MG capsule 100 mg, Oral, 3 times a day (standard)   torsemide 40 mg Tab 40 mg, Oral, Daily PRN   VENTOLIN HFA 90 mcg/actuation inhaler 2 puffs, Inhalation, Every 6 hours PRN   BINAXNOW COVID-19  AG SELF TEST Kit    blood pressure monitor Kit 1 each, Miscellaneous, Daily   blood sugar diagnostic Strp    blood-glucose meter Misc    insulin glargine (BASAGLAR, LANTUS) 100 unit/mL (3 mL) injection pen 40 Units, Subcutaneous, 2 times a day (standard)   lancets Misc Miscellaneous   ONETOUCH VERIO TEST STRIPS Strp USE 1 STRIP TO CHECK GLUCOSE TWICE DAILY   rosuvastatin (CRESTOR) 20 MG tablet 20 mg, Oral, Daily (standard)        ALLERGIES:  Patient has no known allergies.    MEDICAL HISTORY:  Past Medical History:   Diagnosis Date    CKD (chronic kidney disease) stage 3, GFR 30-59 ml/min (CMS-HCC)     CML (chronic myelocytic leukemia) (CMS-HCC)     HFrEF (heart failure with reduced ejection fraction) (CMS-HCC)     Paroxysmal atrial fibrillation (CMS-HCC)     PFO (patent foramen ovale)     Right atrial thrombus     T2DM (type 2 diabetes mellitus) (CMS-HCC)      Past Surgical History:   Procedure Laterality Date    PR COMPRE EP EVAL ABLTJ 3D MAPG TX SVT N/A 12/03/2020    Procedure: A-Flutter Ablation;  Surgeon: Dorcas Carrow, MD;  Location: [facility] EP;  Service: Cardiology    PR RIGHT HEART CATH O2 SATURATION & CARDIAC OUTPUT N/A 11/30/2020    Procedure: Right Heart Catheterization;  Surgeon: Marlaine Hind, MD;  Location: [facility] CATH;  Service: Cardiology    PR TCAT PULMONARY VALVE IMPLANTATION PRQ APPROACH N/A 05/05/2022    Procedure: Peds TPVR;  Surgeon: Charlann Noss, DO;  Location: [facility] PEDS CATH/EP;  Service: Cardiology     SOCIAL HISTORY  Social History     Social History Narrative    Not on file      reports that he quit smoking about 3 years ago. His smoking use included cigarettes. He started smoking about 9 years ago. He has a 3.0 pack-year smoking history. He has never used smokeless tobacco. He reports that he does not drink alcohol and does not use drugs.   FAMILY HISTORY  Family History   Problem Relation Age of Onset    Heart disease Father         Physical Exam:   Vitals:    05/07/22 1120 05/07/22 1546 05/07/22 1551 05/07/22 1600   BP: 174/91 (S) 199/99 (S) 197/102 190/99   Pulse: 88 87 86    Resp:  20     Temp:  36.8 ??C (98.2 ??F)     TempSrc:  Oral     SpO2:  100%     Weight:       Height:         I/O this shift:  In: 610 [P.O.:610]  Out: 0     Intake/Output Summary (Last 24 hours) at 05/07/2022 1647  Last data filed at 05/07/2022 1400  Gross per 24 hour   Intake 3586.67 ml   Output 250 ml   Net 3336.67 ml     Constitutional: well-appearing, no acute distress  Heart: RRR  Lungs: CTAB, normal wob  Abd: soft, non-tender, non-distended  Ext: trace edema

## 2022-05-08 NOTE — Unmapped (Addendum)
Cardiology - Team 2 [facility]) Progress Note    Assessment & Plan:   Colin Hunter is a 52 y.o. 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     Principal Problem:    Severe pulmonary valve insufficiency  Active Problems:    Atrial fibrillation (CMS-HCC)    Chronic myeloid leukemia (CMS-HCC)    CKD (chronic kidney disease) stage 3, GFR 30-59 ml/min (CMS-HCC)    Congenital pulmonary valve stenosis    Essential hypertension    Gastroesophageal reflux disease    Heart failure with reduced ejection fraction (CMS-HCC)    Type 2 diabetes mellitus treated with insulin (CMS-HCC)  Resolved Problems:    * No resolved hospital problems. *    Active Problems    AKI on CKD - Metabolic acidosis - Hyperkalemia - Hyponatremia  His baseline creatinine is 2.5-3. Cr 3.43 on admission but continuing to rise in the context of recent large contrast load. Prior to 1/28, patient had very minimal urine output over the previous 48 hours and there was concern that dialysis may be necessary within the coming days. Patient had significant urine output over the past 24 hours (net negative 4.3L) which is encouraging. Will continue to discuss and take guidance from Nephrology.  - Nephrology consult, appreciate continued recommendations  - Diuresis per Nephrology recs  - Continue lokelma 10 g BID  - Continue sodium bicarb 1300 mg TID  - Avoid nephrotoxic agents  - Renally dose medications  - Trend BMP   - Strict intake/output     Severe pulmonary insufficiency s/p pulmonary valve replacement  - RV dilation and failure - HFrEF (EF >55% Nov 2023)  History of congenital pulmonary valve stenosis s/p surgical valvotomy as a child that has been complicated by chronic moderate to severe pulmonary valve insufficiency and dilated RV with reduced function. Patient is s/p placement of pulmonary valve replacement (25 mm Harmony) 1/25 and has been recovering appropriately. Routine post-op care maintained and follow up imaging shows adequate function of the prosthesis.   - Continue home apixaban 5mg  BID today   - Continue telemetry   - Ziopatch prior to discharge     Hypertension  Will start nifedipine 30 mg daily for continued hypertension. Consider discontinuing Isordil 20 mg TID tomorrow if improvement with nifedipine.  - Start nifedipine 30 mg daily  - Plan to discontinue Isordil 1/31     Chronic Problems  CML: Hold inpatient asciminib 40 mg BID, if longer hospitalization will need hematology to order  HLD: continue rosuvastatin 20 mg daily  T2DM: at home taking 40 units Lantus BID, 26 units Lispro TID, and Jardiance. Continuing Jardiance 10 mg daily, 26 units Lispro with meals, and will half Lantus to 20 units BID.  Atrial Flutter: Continue metoprolol tartrate 25 mg daily, apixaban 5 mg BID     Issues Impacting Complexity of Management:  -The patient is at high risk of complications from contrast administration in existing CKD    Daily Checklist:  Diet: Regular Diet  DVT PPx: Patient Already on Full Anticoagulation with apixaban  Electrolytes: No Repletion Needed  Code Status: Full Code  Dispo:  Continue floor care. Discharge postponed given evidence of ongoing renal injury,  likely to be exacerbated     Team Contact Information:   Primary Team: Cardiology - Team 2 [facility])  Primary Resident: Marilu Favre, MD    Interval History:   No acute events overnight. He continues to feel well. Minor oozing from right groin site overnight that has resolved. Patient had significant urine output overnight and has continued to urinate this morning without additional diuretic dosing.    All other systems were reviewed and are negative except as noted in the HPI    Objective:   Temp:  [36.8 ??C (98.2 ??F)-37.6 ??C (99.7 ??F)] 37.6 ??C (99.7 ??F)  Heart Rate:  [86-102] 102  Resp:  [18-20] 18  BP: (161-199)/(93-102) 161/94  SpO2:  [96 %-100 %] 96 %    Gen: NAD, converses appropriately, sitting at the edge of the bed  HEENT: atraumatic, normocephalic, EOM grossly intact, pupils equal  Heart: RRR  Lungs: CTAB, no crackles or wheezes  Abdomen: soft, NTND, L groin access clean/dry/intact, gauze in place over R groin site  Extremities: Trace pitting edema present bilaterally, improved from prior day      Marilu Favre, DO  Internal Medicine Resident, PGY-2

## 2022-05-09 LAB — HEMOGLOBIN A1C
ESTIMATED AVERAGE GLUCOSE: 148 mg/dL
HEMOGLOBIN A1C: 6.8 % — ABNORMAL HIGH (ref 4.8–5.6)

## 2022-05-09 LAB — CBC
HEMATOCRIT: 37.5 % — ABNORMAL LOW (ref 39.0–48.0)
HEMOGLOBIN: 13 g/dL (ref 12.9–16.5)
MEAN CORPUSCULAR HEMOGLOBIN CONC: 34.7 g/dL (ref 32.0–36.0)
MEAN CORPUSCULAR HEMOGLOBIN: 29 pg (ref 25.9–32.4)
MEAN CORPUSCULAR VOLUME: 83.5 fL (ref 77.6–95.7)
MEAN PLATELET VOLUME: 9.3 fL (ref 6.8–10.7)
PLATELET COUNT: 86 10*9/L — ABNORMAL LOW (ref 150–450)
RED BLOOD CELL COUNT: 4.49 10*12/L (ref 4.26–5.60)
RED CELL DISTRIBUTION WIDTH: 14.1 % (ref 12.2–15.2)
WBC ADJUSTED: 8.6 10*9/L (ref 3.6–11.2)

## 2022-05-09 LAB — LIPID PANEL
CHOLESTEROL/HDL RATIO SCREEN: 2.3 (ref 1.0–4.5)
CHOLESTEROL: 134 mg/dL (ref <=200)
HDL CHOLESTEROL: 59 mg/dL (ref 40–60)
LDL CHOLESTEROL CALCULATED: 50 mg/dL (ref 40–99)
NON-HDL CHOLESTEROL: 75 mg/dL (ref 70–130)
TRIGLYCERIDES: 123 mg/dL (ref 0–150)
VLDL CHOLESTEROL CAL: 24.6 mg/dL (ref 12–47)

## 2022-05-09 LAB — BASIC METABOLIC PANEL
ANION GAP: 15 mmol/L — ABNORMAL HIGH (ref 5–14)
BLOOD UREA NITROGEN: 69 mg/dL — ABNORMAL HIGH (ref 9–23)
BUN / CREAT RATIO: 9
CALCIUM: 9.1 mg/dL (ref 8.7–10.4)
CHLORIDE: 103 mmol/L (ref 98–107)
CO2: 17 mmol/L — ABNORMAL LOW (ref 20.0–31.0)
CREATININE: 7.54 mg/dL — ABNORMAL HIGH
EGFR CKD-EPI (2021) MALE: 8 mL/min/{1.73_m2} — ABNORMAL LOW (ref >=60)
GLUCOSE RANDOM: 80 mg/dL (ref 70–179)
POTASSIUM: 4.5 mmol/L (ref 3.4–4.8)
SODIUM: 135 mmol/L (ref 135–145)

## 2022-05-09 LAB — MAGNESIUM: MAGNESIUM: 2 mg/dL (ref 1.6–2.6)

## 2022-05-09 MED ADMIN — sodium bicarbonate tablet 1,300 mg: 1300 mg | ORAL | @ 13:00:00

## 2022-05-09 MED ADMIN — folic acid (FOLVITE) tablet 1 mg: 1 mg | ORAL | @ 13:00:00

## 2022-05-09 MED ADMIN — sodium bicarbonate tablet 1,300 mg: 1300 mg | ORAL | @ 19:00:00

## 2022-05-09 MED ADMIN — pregabalin (LYRICA) capsule 100 mg: 100 mg | ORAL | @ 02:00:00

## 2022-05-09 MED ADMIN — apixaban (ELIQUIS) tablet 5 mg: 5 mg | ORAL | @ 02:00:00

## 2022-05-09 MED ADMIN — NIFEdipine (PROCARDIA XL) 24 hr tablet 30 mg: 30 mg | ORAL | @ 15:00:00

## 2022-05-09 MED ADMIN — pregabalin (LYRICA) capsule 100 mg: 100 mg | ORAL | @ 19:00:00

## 2022-05-09 MED ADMIN — isosorbide dinitrate (ISORDIL) tablet 20 mg: 20 mg | ORAL | @ 13:00:00

## 2022-05-09 MED ADMIN — atorvastatin (LIPITOR) tablet 20 mg: 20 mg | ORAL | @ 02:00:00

## 2022-05-09 MED ADMIN — magnesium citrate solution 296 mL: 296 mL | ORAL | @ 17:00:00 | Stop: 2022-05-09

## 2022-05-09 MED ADMIN — isosorbide dinitrate (ISORDIL) tablet 20 mg: 20 mg | ORAL | @ 19:00:00

## 2022-05-09 MED ADMIN — apixaban (ELIQUIS) tablet 5 mg: 5 mg | ORAL | @ 13:00:00

## 2022-05-09 MED ADMIN — insulin glargine (LANTUS) injection 20 Units: 20 [IU] | SUBCUTANEOUS | @ 02:00:00

## 2022-05-09 MED ADMIN — pregabalin (LYRICA) capsule 100 mg: 100 mg | ORAL | @ 13:00:00

## 2022-05-09 MED ADMIN — pantoprazole (Protonix) EC tablet 40 mg: 40 mg | ORAL | @ 13:00:00

## 2022-05-09 MED ADMIN — insulin lispro (HumaLOG) injection 13 Units: 13 [IU] | SUBCUTANEOUS | @ 17:00:00

## 2022-05-09 MED ADMIN — insulin lispro (HumaLOG) injection 26 Units: 26 [IU] | SUBCUTANEOUS | @ 14:00:00 | Stop: 2022-05-09

## 2022-05-09 MED ADMIN — diclofenac sodium (VOLTAREN) 1 % gel 2 g: 2 g | TOPICAL | @ 02:00:00

## 2022-05-09 MED ADMIN — acetaminophen (TYLENOL) tablet 650 mg: 650 mg | ORAL

## 2022-05-09 MED ADMIN — diclofenac sodium (VOLTAREN) 1 % gel 2 g: 2 g | TOPICAL | @ 17:00:00

## 2022-05-09 MED ADMIN — sodium zirconium cyclosilicate (LOKELMA) packet 10 g: 10 g | ORAL | @ 02:00:00

## 2022-05-09 MED ADMIN — acetaminophen (TYLENOL) tablet 650 mg: 650 mg | ORAL | @ 10:00:00

## 2022-05-09 MED ADMIN — insulin glargine (LANTUS) injection 20 Units: 20 [IU] | SUBCUTANEOUS | @ 15:00:00

## 2022-05-09 MED ADMIN — sodium bicarbonate tablet 1,300 mg: 1300 mg | ORAL | @ 02:00:00

## 2022-05-09 MED ADMIN — empagliflozin (JARDIANCE) tablet 10 mg: 10 mg | ORAL | @ 13:00:00

## 2022-05-09 MED ADMIN — furosemide (LASIX) 120 mg in sodium chloride (NS) 0.9 % 50 mL IVPB: 120 mg | INTRAVENOUS | @ 10:00:00 | Stop: 2022-05-09

## 2022-05-09 MED ADMIN — sodium zirconium cyclosilicate (LOKELMA) packet 10 g: 10 g | ORAL | @ 13:00:00

## 2022-05-09 MED ADMIN — insulin lispro (HumaLOG) injection 13 Units: 13 [IU] | SUBCUTANEOUS

## 2022-05-09 NOTE — Unmapped (Addendum)
Pt A/Ox4. VSS, hypertensive within ordered parameters. Declined dinner insulin, not eating carbs, bg 99, given lantus, MD aware. Oozing from R femoral groin site, applied pressure and changed dressing, MD notified. Over 4L output. Call bell within reach, patient denies concerns at this time.        Problem: Adult Inpatient Plan of Care  Goal: Plan of Care Review  Outcome: Progressing  Goal: Patient-Specific Goal (Individualized)  Outcome: Progressing  Goal: Absence of Hospital-Acquired Illness or Injury  Outcome: Progressing  Intervention: Identify and Manage Fall Risk  Recent Flowsheet Documentation  Taken 05/08/2022 2045 by Kateri Mc, RN  Safety Interventions:   low bed   lighting adjusted for tasks/safety   infection management   fall reduction program maintained   environmental modification   nonskid shoes/slippers when out of bed  Intervention: Prevent Skin Injury  Recent Flowsheet Documentation  Taken 05/08/2022 2045 by Kateri Mc, RN  Positioning for Skin: Sitting in Chair  Device Skin Pressure Protection:   absorbent pad utilized/changed   adhesive use limited  Skin Protection:   adhesive use limited   incontinence pads utilized   tubing/devices free from skin contact  Intervention: Prevent Infection  Recent Flowsheet Documentation  Taken 05/08/2022 2045 by Kateri Mc, RN  Infection Prevention:   environmental surveillance performed   hand hygiene promoted  Goal: Optimal Comfort and Wellbeing  Outcome: Progressing  Goal: Readiness for Transition of Care  Outcome: Progressing  Goal: Rounds/Family Conference  Outcome: Progressing     Problem: Skin Injury Risk Increased  Goal: Skin Health and Integrity  Outcome: Progressing  Intervention: Optimize Skin Protection  Recent Flowsheet Documentation  Taken 05/08/2022 2045 by Kateri Mc, RN  Activity Management: up ad lib  Pressure Reduction Techniques:   frequent weight shift encouraged   pressure points protected  Head of Bed (HOB) Positioning: HOB elevated  Pressure Reduction Devices: pressure-redistributing mattress utilized  Skin Protection:   adhesive use limited   incontinence pads utilized   tubing/devices free from skin contact     Problem: Fall Injury Risk  Goal: Absence of Fall and Fall-Related Injury  Outcome: Progressing  Intervention: Promote Scientist, clinical (histocompatibility and immunogenetics) Documentation  Taken 05/08/2022 2045 by Kateri Mc, RN  Safety Interventions:   low bed   lighting adjusted for tasks/safety   infection management   fall reduction program maintained   environmental modification   nonskid shoes/slippers when out of bed     Problem: Wound  Goal: Optimal Coping  Outcome: Progressing  Goal: Optimal Functional Ability  Outcome: Progressing  Intervention: Optimize Functional Ability  Recent Flowsheet Documentation  Taken 05/08/2022 2045 by Kateri Mc, RN  Activity Management: up ad lib  Goal: Absence of Infection Signs and Symptoms  Outcome: Progressing  Intervention: Prevent or Manage Infection  Recent Flowsheet Documentation  Taken 05/08/2022 2045 by Sherilyn Cooter T, RN  Infection Management: aseptic technique maintained  Goal: Improved Oral Intake  Outcome: Progressing  Goal: Optimal Pain Control and Function  Outcome: Progressing  Goal: Skin Health and Integrity  Outcome: Progressing  Intervention: Optimize Skin Protection  Recent Flowsheet Documentation  Taken 05/08/2022 2045 by Kateri Mc, RN  Activity Management: up ad lib  Pressure Reduction Techniques:   frequent weight shift encouraged   pressure points protected  Head of Bed (HOB) Positioning: HOB elevated  Pressure Reduction Devices: pressure-redistributing mattress utilized  Skin Protection:   adhesive use limited  incontinence pads utilized   tubing/devices free from skin contact  Goal: Optimal Wound Healing  Outcome: Progressing     Problem: Comorbidity Management  Goal: Blood Glucose Levels Within Targeted Range  Outcome: Progressing  Goal: Maintenance of Heart Failure Symptom Control  Outcome: Progressing  Goal: Blood Pressure in Desired Range  Outcome: Progressing     Problem: Heart Failure  Goal: Optimal Coping  Outcome: Progressing  Goal: Optimal Cardiac Output  Outcome: Progressing  Goal: Stable Heart Rate and Rhythm  Outcome: Progressing  Goal: Optimal Functional Ability  Outcome: Progressing  Intervention: Optimize Functional Ability  Recent Flowsheet Documentation  Taken 05/08/2022 2045 by Kateri Mc, RN  Activity Management: up ad lib  Goal: Fluid and Electrolyte Balance  Outcome: Progressing  Goal: Improved Oral Intake  Outcome: Progressing  Goal: Effective Oxygenation and Ventilation  Outcome: Progressing  Intervention: Promote Airway Secretion Clearance  Recent Flowsheet Documentation  Taken 05/08/2022 2045 by Kateri Mc, RN  Activity Management: up ad lib  Intervention: Optimize Oxygenation and Ventilation  Recent Flowsheet Documentation  Taken 05/08/2022 2045 by Kateri Mc, RN  Head of Bed [facility]) Positioning: [facility] elevated  Goal: Effective Breathing Pattern During Sleep  Outcome: Progressing

## 2022-05-09 NOTE — Unmapped (Cosign Needed)
Nephrology Consult Note    Requesting Attending Physician :  Meredith Leeds, MD  Service Requesting Consult : Cardiology [facility])  Reason for Consult: AKI on CKD     Assessment and Plan:  [name] is a 52 yo with congenital pulmonary valve stenosis, CKDIII who is s/p transcatheter pulmonary valve placement on 1/25. Course c/b AKI.     # AKI on CKD: BL Cr 2.3 - 2.8 scheduled to establish with [facility] nephrology early next month. This hospitalization he has been noted to have a rapid rise in Cr since pulmonic valve replacement on 1/25. Timing of rising Cr appears to correlate with large contrast load. He continues to slowly worsen though this seems to be plateauing - up to 7.54  - Care for contrast nephropathy is otherwise supportive with goal to maintain euvolemia, MAP >65, and avoid nephrotoxic agents as able.   - Follow-up scheduled with nephrology - will need to reschedule this.   - Discussed again today that he may need dialysis depending on course of recovery though making excellent UOP with diuretics  - remains mildly volume up today; agree with IV diuretics today but would consider switch to PO diuretics 1/30 as dramatic UOP suggests ?some component of autodiuresis    #HTN  Isordil 20 tid, nifedipine 30mg ; lisinopril on hold currently    # Hyponatremia,improved.  Now at 135 with diuresis (corrected ~9 in 24 hours with 5L urine output)    # Metabolic acidosis:   - trend for now, will add sodium bicarbonate if persistently <20; Improved with diuresis.   - continue sodium bicarbonate 1300mg  tid    #Hyperkalemia, stable; improved with diuretics  - lokelma 10g bid    # Pulmonic valve insufficiency  - S/p valve replacement 1/25    - Evaluation and management per primary team  - No changes to management from a nephrology standpoint at this time    RECOMMENDATIONS:   - Sodium bicarb 1300 mg TID  - Lokelma 10 g BID  - Agree with continued IV diuretics, 120 mg IV Lasix BID given that he is mildly overloaded. Would switch to PO lasix for less active diuresis 1/30  - We will continue to follow.     Clyde Lundborg, MD  05/09/2022 1:23 PM     Medical decision-making for 05/09/22  Findings / Data     Patient has: []  acute illness w/systemic sxs  [mod]  []  two or more stable chronic illnesses [mod]  []  one chronic illness with acute exacerbation [mod]  []  acute complicated illness  [mod]  []  Undiagnosed new problem with uncertain prognosis  [mod] [x]  illness posing risk to life or bodily function (ex. AKI)  [high]  []  chronic illness with severe exacerbation/progression  [high]  []  chronic illness with severe side effects of treatment  [high] AKI, hyponatremia  Probs At least 2:  Probs, Data, Risk   I reviewed: [x]  primary team note  []  consultant note(s)  []  external records [x]  chemistry results  []  CBC results  []  blood gas results  []  Other []  procedure/op note(s)   [x]  radiology report(s)  []  micro result(s)  []  w/ independent historian(s) Hyponatremia, elevated Cr.. Pulmonic valve op note and TTE read reviewed  ?3 Data Review (2 of 3)    I independently interpreted: []  Urine Sediment  []  Renal US []  CXR Images  []  CT Images  []  Other []  EKG Tracing  Any     I discussed: []  Pathology results w/  QHPs(s) from other specialties  []  Procedural findings w/ QHPs(s) from other specialties []  Imaging w/ QHP(s) from other specialties  [x]  Treatment plan w/ QHP(s) from other specialties Plan discussed with primary team Any     Mgm't requires: [x]  Prescription drug(s)  [mod]  []  Kidney biopsy  [mod]  []  Central line placement  [mod] [x]  High risk medication use and/or intensive toxicity monitoring [high]  []  Renal replacement therapy [high]  []  High risk kidney biopsy  [high]  []  Escalation of care  [high]  []  High risk central line placement  [high] IV diuretics, lokelma Risk      ____________________________________________________    Interval Hx   Feeling better today with less edema after heavy diuresis yesterday. History of Present Illness: Colin Hunter is 52 y.o. male with CKDIII, pulmonic insufficiency who is seen in consultation at the request of Meredith Leeds, MD and Cardiology [facility]). Nephrology has been consulted for AKI.   Background:  Pt admitted 05/05/2022 and underwent pulmonic valve replacement. Since  then Cr has rapidly risen from 3.4 (BL 2.3-2.8) to 6 as of time of consult. He reports feeling generally well and better since his valve was replaced. He has noticed he is urinating a lot less. He feels like he can get the urine out. He denies SOB or edema.      INPATIENT MEDICATIONS:    Current Facility-Administered Medications:     acetaminophen (TYLENOL) tablet 650 mg, Oral, Q6H PRN    albuterol (PROVENTIL HFA;VENTOLIN HFA) 90 mcg/actuation inhaler 2 puff, Inhalation, Q6H PRN    apixaban (ELIQUIS) tablet 5 mg, Oral, BID    atorvastatin (LIPITOR) tablet 20 mg, Oral, Nightly    dextrose 50 % in water (D50W) 50 % solution 12.5 g, Intravenous, Q10 Min PRN    diclofenac sodium (VOLTAREN) 1 % gel 2 g, Topical, QID    empagliflozin (JARDIANCE) tablet 10 mg, Oral, Daily    folic acid (FOLVITE) tablet 1 mg, Oral, Daily    furosemide (LASIX) 120 mg in sodium chloride (NS) 0.9 % 50 mL IVPB, Intravenous, BID    glucagon injection 1 mg, Intramuscular, Once PRN    glucose chewable tablet 16 g, Oral, Q10 Min PRN    hydrALAZINE (APRESOLINE) injection 10 mg, Intravenous, Q6H PRN    insulin glargine (LANTUS) injection 20 Units, Subcutaneous, BID    insulin lispro (HumaLOG) injection 13 Units, Subcutaneous, TID AC    isosorbide dinitrate (ISORDIL) tablet 20 mg, Oral, TID    lidocaine 4 % patch 1 patch, Transdermal, Daily    NIFEdipine (PROCARDIA XL) 24 hr tablet 30 mg, Oral, Daily    pantoprazole (Protonix) EC tablet 40 mg, Oral, Daily    pregabalin (LYRICA) capsule 100 mg, Oral, TID    sodium bicarbonate tablet 1,300 mg, Oral, TID    sodium zirconium cyclosilicate (LOKELMA) packet 10 g, Oral, BID    OUTPATIENT MEDICATIONS:  Prior to Admission medications    Medication Dose, Route, Frequency   apixaban (ELIQUIS) 5 mg Tab 5 mg, Oral, 2 times a day (standard)   asciminib (SCEMBLIX) 40 mg tablet Take 1 tablet (40 mg total) by mouth Two (2) times a day. Take on an empty stomach, at least 1 hour before or 2 hours after a meal. Swallow tablets whole. Do not break, crush, or chew the tablets.   folic acid (FOLVITE) 1 MG tablet 1 mg, Oral, Daily   insulin lispro (HUMALOG) 100 unit/mL injection pen INJECT 26 UNITS THREE TIMES DAILY BEFORE MEAL(S)  JARDIANCE 10 mg tablet 10 mg, Oral, Daily (standard)   metOLazone (ZAROXOLYN) 2.5 MG tablet 2.5 mg, Oral, Daily (standard)   metoprolol succinate (TOPROL-XL) 100 MG 24 hr tablet 100 mg, Oral, Daily (standard)   OZEMPIC 0.25 mg or 0.5 mg(2 mg/1.5 mL) PnIj injection 0.5 mg, Subcutaneous, Every 7 days   pantoprazole (PROTONIX) 40 MG tablet 40 mg, Oral, Daily (standard)   pregabalin (LYRICA) 100 MG capsule 100 mg, Oral, 3 times a day (standard)   torsemide 40 mg Tab 40 mg, Oral, Daily PRN   VENTOLIN HFA 90 mcg/actuation inhaler 2 puffs, Inhalation, Every 6 hours PRN   BINAXNOW COVID-19 AG SELF TEST Kit    blood pressure monitor Kit 1 each, Miscellaneous, Daily   blood sugar diagnostic Strp    blood-glucose meter Misc    insulin glargine (BASAGLAR, LANTUS) 100 unit/mL (3 mL) injection pen 40 Units, Subcutaneous, 2 times a day (standard)   lancets Misc Miscellaneous   ONETOUCH VERIO TEST STRIPS Strp USE 1 STRIP TO CHECK GLUCOSE TWICE DAILY   rosuvastatin (CRESTOR) 20 MG tablet 20 mg, Oral, Daily (standard)        ALLERGIES:  Patient has no known allergies.    MEDICAL HISTORY:  Past Medical History:   Diagnosis Date    CKD (chronic kidney disease) stage 3, GFR 30-59 ml/min (CMS-HCC)     CML (chronic myelocytic leukemia) (CMS-HCC)     HFrEF (heart failure with reduced ejection fraction) (CMS-HCC)     Paroxysmal atrial fibrillation (CMS-HCC)     PFO (patent foramen ovale)     Right atrial thrombus     T2DM (type 2 diabetes mellitus) (CMS-HCC)      Past Surgical History:   Procedure Laterality Date    PR COMPRE EP EVAL ABLTJ 3D MAPG TX SVT N/A 12/03/2020    Procedure: A-Flutter Ablation;  Surgeon: Dorcas Carrow, MD;  Location: [facility] EP;  Service: Cardiology    PR RIGHT HEART CATH O2 SATURATION & CARDIAC OUTPUT N/A 11/30/2020    Procedure: Right Heart Catheterization;  Surgeon: Marlaine Hind, MD;  Location: [facility] CATH;  Service: Cardiology    PR TCAT PULMONARY VALVE IMPLANTATION PRQ APPROACH N/A 05/05/2022    Procedure: Peds TPVR;  Surgeon: Charlann Noss, DO;  Location: [facility] PEDS CATH/EP;  Service: Cardiology     SOCIAL HISTORY  Social History     Social History Narrative    Not on file      reports that he quit smoking about 3 years ago. His smoking use included cigarettes. He started smoking about 9 years ago. He has a 3.0 pack-year smoking history. He has never used smokeless tobacco. He reports that he does not drink alcohol and does not use drugs.   FAMILY HISTORY  Family History   Problem Relation Age of Onset    Heart disease Father         Physical Exam:   Vitals:    05/08/22 2319 05/09/22 0437 05/09/22 0700 05/09/22 0800   BP: 183/103   161/97   Pulse: 101 99 96 101   Resp: 18   20   Temp: 36.6 ??C (97.8 ??F)   36.6 ??C (97.9 ??F)   TempSrc: Temporal   Oral   SpO2: 100%   99%   Weight:       Height:         I/O this shift:  In: 240 [P.O.:240]  Out: 1450 [Urine:1450]    Intake/Output Summary (Last 24 hours) at  05/09/2022 1323  Last data filed at 05/09/2022 1000  Gross per 24 hour   Intake 870 ml   Output 6425 ml   Net -5555 ml       Constitutional: well-appearing, no acute distress  Heart: RRR; JVP at mid neck  Lungs: CTAB, normal wob  Abd: soft, non-tender, non-distended  Ext: trace + edema

## 2022-05-09 NOTE — Unmapped (Signed)
Currently admitted into the hospital

## 2022-05-09 NOTE — Unmapped (Addendum)
Pt is calm, cooperative, pain free, great UO. Still constipated. Mag Citrate given, no resolution so far. Last BM 1.24. Pt denied n/v. Passing flatus. BG dropped to 70, team was updated and nutritional insulin dose dropped from 26 to 13 units. BG recovered.   Problem: Adult Inpatient Plan of Care  Goal: Absence of Hospital-Acquired Illness or Injury  Intervention: Identify and Manage Fall Risk  Recent Flowsheet Documentation  Taken 05/09/2022 0800 by Laural Golden D, RN  Safety Interventions: low bed  Intervention: Prevent Skin Injury  Recent Flowsheet Documentation  Taken 05/09/2022 1553 by Laural Golden D, RN  Positioning for Skin: Sitting in Chair  Taken 05/09/2022 1400 by Laural Golden D, RN  Positioning for Skin: Sitting in Chair  Taken 05/09/2022 1200 by Laural Golden D, RN  Positioning for Skin: Sitting in Chair  Taken 05/09/2022 1000 by [place], Kyla Balzarine D, RN  Positioning for Skin: (sitting at the edge of the bed) Standing  Taken 05/09/2022 0800 by [place], Kyla Balzarine D, RN  Positioning for Skin: (SITTING AT THE EDGE OF THE BED) Other (Comment)  Device Skin Pressure Protection: absorbent pad utilized/changed  Skin Protection: adhesive use limited  Intervention: Prevent Infection  Recent Flowsheet Documentation  Taken 05/09/2022 0800 by Laural Golden D, RN  Infection Prevention: environmental surveillance performed     Problem: Skin Injury Risk Increased  Goal: Skin Health and Integrity  Intervention: Optimize Skin Protection  Recent Flowsheet Documentation  Taken 05/09/2022 0800 by Laural Golden D, RN  Activity Management: up ad lib  Pressure Reduction Techniques: frequent weight shift encouraged  Head of Bed (HOB) Positioning: HOB elevated  Pressure Reduction Devices: pressure-redistributing mattress utilized  Skin Protection: adhesive use limited     Problem: Fall Injury Risk  Goal: Absence of Fall and Fall-Related Injury  Intervention: Promote Injury-Free Management consultant Documentation  Taken 05/09/2022 0800 by [place], Kyla Balzarine D, RN  Safety Interventions: low bed     Problem: Wound  Goal: Optimal Functional Ability  Intervention: Optimize Functional Ability  Recent Flowsheet Documentation  Taken 05/09/2022 0800 by [place], Kyla Balzarine D, RN  Activity Management: up ad lib  Goal: Absence of Infection Signs and Symptoms  Intervention: Prevent or Manage Infection  Recent Flowsheet Documentation  Taken 05/09/2022 0800 by Laural Golden D, RN  Infection Management: aseptic technique maintained  Goal: Skin Health and Integrity  Intervention: Optimize Skin Protection  Recent Flowsheet Documentation  Taken 05/09/2022 0800 by [place], Kyla Balzarine D, RN  Activity Management: up ad lib  Pressure Reduction Techniques: frequent weight shift encouraged  Head of Bed (HOB) Positioning: HOB elevated  Pressure Reduction Devices: pressure-redistributing mattress utilized  Skin Protection: adhesive use limited     Problem: Heart Failure  Goal: Optimal Functional Ability  Intervention: Optimize Functional Ability  Recent Flowsheet Documentation  Taken 05/09/2022 0800 by [place], Kyla Balzarine D, RN  Activity Management: up ad lib  Goal: Effective Oxygenation and Ventilation  Intervention: Promote Airway Secretion Clearance  Recent Flowsheet Documentation  Taken 05/09/2022 0800 by [place] D, RN  Activity Management: up ad lib  Intervention: Optimize Oxygenation and Ventilation  Recent Flowsheet Documentation  Taken 05/09/2022 0800 by [place], Kyla Balzarine D, RN  Head of [facility]) Positioning: HOB elevated

## 2022-05-09 NOTE — Unmapped (Signed)
Cardiology - Team 2 [facility]) Progress Note    Assessment & Plan:   Colin Hunter is a 52 y.o. 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     Principal Problem:    Severe pulmonary valve insufficiency  Active Problems:    Atrial fibrillation (CMS-HCC)    Chronic myeloid leukemia (CMS-HCC)    CKD (chronic kidney disease) stage 3, GFR 30-59 ml/min (CMS-HCC)    Congenital pulmonary valve stenosis    Essential hypertension    Gastroesophageal reflux disease    Heart failure with reduced ejection fraction (CMS-HCC)    Type 2 diabetes mellitus treated with insulin (CMS-HCC)  Resolved Problems:    * No resolved hospital problems. *    Active Problems    AKI on CKD - Metabolic acidosis - Hyperkalemia - Hyponatremia  His baseline creatinine is 2.5-3. Cr 3.43 on admission but continuing to rise in the context of recent large contrast load. Prior to 1/28, patient had very minimal urine output over the previous 48 hours and there was concern that dialysis may be necessary within the coming days. Patient had significant urine output over the past 24 hours (net negative 4.3L) which is encouraging. Will continue to discuss and take guidance from Nephrology.  - Nephrology consult, appreciate continued recommendations  - IV Lasix 120 mg BID  - Continue lokelma 10 g BID  - Continue sodium bicarb 1300 mg TID  - Avoid nephrotoxic agents  - Renally dose medications  - Trend BMP   - Strict intake/output     Severe pulmonary insufficiency s/p pulmonary valve replacement  - RV dilation and failure - HFrEF (EF >55% Nov 2023)  History of congenital pulmonary valve stenosis s/p surgical valvotomy as a child that has been complicated by chronic moderate to severe pulmonary valve insufficiency and dilated RV with reduced function. Patient is s/p placement of pulmonary valve replacement (25 mm Harmony) 1/25 and has been recovering appropriately. Routine post-op care maintained and follow up imaging shows adequate function of the prosthesis.   - Continue home apixaban 5mg  BID today   - Continue telemetry   - Ziopatch prior to discharge     Hypertension  Will start nifedipine 30 mg daily for continued hypertension. Consider discontinuing Isordil 20 mg TID tomorrow if improvement with nifedipine.  - Start nifedipine 30 mg daily  - Plan to discontinue Isordil 1/31     Chronic Problems  CML: Hold inpatient asciminib 40 mg BID, if longer hospitalization will need hematology to order  HLD: continue rosuvastatin 20 mg daily  T2DM: at home taking 40 units Lantus BID, 26 units Lispro TID, and Jardiance. Continuing Jardiance 10 mg daily, 26 units Lispro with meals, and will half Lantus to 20 units BID.  Atrial Flutter: Continue metoprolol tartrate 25 mg daily, apixaban 5 mg BID     Issues Impacting Complexity of Management:  -The patient is at high risk of complications from contrast administration in existing CKD    Daily Checklist:  Diet: Regular Diet  DVT PPx: Patient Already on Full Anticoagulation with apixaban  Electrolytes: No Repletion Needed  Code Status: Full Code  Dispo:  Continue floor care. Discharge postponed given evidence of ongoing renal  injury, likely to be exacerbated     Team Contact Information:   Primary Team: Cardiology - Team 2 [facility])  Primary Resident: Marilu Favre, MD    Interval History:   No acute events overnight. He continues to feel well. Minor oozing from right groin site overnight that has resolved. Patient had significant urine output overnight and has continued to urinate this morning without additional diuretic dosing.    All other systems were reviewed and are negative except as noted in the HPI    Objective:   Temp:  [36.4 ??C (97.6 ??F)-36.6 ??C (97.9 ??F)] 36.6 ??C (97.9 ??F)  Heart Rate:  [94-104] 101  Resp:  [16-20] 20  BP: (161-183)/(96-104) 161/97  SpO2:  [96 %-100 %] 99 %    Gen: NAD, converses appropriately, sitting at the edge of the bed  HEENT: atraumatic, normocephalic, EOM grossly intact, pupils equal  Heart: RRR  Lungs: CTAB, no crackles or wheezes  Abdomen: soft, NTND, L groin access clean/dry/intact, gauze in place over R groin site  Extremities: Trace pitting edema present bilaterally, improved from prior day      Marilu Favre, DO  Internal Medicine Resident, PGY-2

## 2022-05-09 NOTE — Unmapped (Signed)
Remains A&Ox4. Elevated BP but within MD specified parameters. Verbalized headache and lower back pains but declined PRN intervention. Fluid intake compliant and Urine output improving. Discharge planning ongoing.    Problem: Adult Inpatient Plan of Care  Goal: Plan of Care Review  Outcome: Progressing  Goal: Patient-Specific Goal (Individualized)  Outcome: Progressing  Goal: Absence of Hospital-Acquired Illness or Injury  Outcome: Progressing  Goal: Optimal Comfort and Wellbeing  Outcome: Progressing  Goal: Readiness for Transition of Care  Outcome: Progressing  Goal: Rounds/Family Conference  Outcome: Progressing     Problem: Skin Injury Risk Increased  Goal: Skin Health and Integrity  Outcome: Progressing     Problem: Fall Injury Risk  Goal: Absence of Fall and Fall-Related Injury  Outcome: Progressing     Problem: Wound  Goal: Optimal Coping  Outcome: Progressing  Goal: Optimal Functional Ability  Outcome: Progressing  Goal: Absence of Infection Signs and Symptoms  Outcome: Progressing  Goal: Improved Oral Intake  Outcome: Progressing  Goal: Optimal Pain Control and Function  Outcome: Progressing  Goal: Skin Health and Integrity  Outcome: Progressing  Goal: Optimal Wound Healing  Outcome: Progressing     Problem: Comorbidity Management  Goal: Blood Glucose Levels Within Targeted Range  Outcome: Progressing  Goal: Maintenance of Heart Failure Symptom Control  Outcome: Progressing  Goal: Blood Pressure in Desired Range  Outcome: Progressing

## 2022-05-10 LAB — CBC
HEMATOCRIT: 40.5 % (ref 39.0–48.0)
HEMOGLOBIN: 13.7 g/dL (ref 12.9–16.5)
MEAN CORPUSCULAR HEMOGLOBIN CONC: 33.8 g/dL (ref 32.0–36.0)
MEAN CORPUSCULAR HEMOGLOBIN: 28.8 pg (ref 25.9–32.4)
MEAN CORPUSCULAR VOLUME: 85.4 fL (ref 77.6–95.7)
MEAN PLATELET VOLUME: 8.6 fL (ref 6.8–10.7)
PLATELET COUNT: 89 10*9/L — ABNORMAL LOW (ref 150–450)
RED BLOOD CELL COUNT: 4.74 10*12/L (ref 4.26–5.60)
RED CELL DISTRIBUTION WIDTH: 14.2 % (ref 12.2–15.2)
WBC ADJUSTED: 7.8 10*9/L (ref 3.6–11.2)

## 2022-05-10 LAB — BASIC METABOLIC PANEL
ANION GAP: 10 mmol/L (ref 5–14)
BLOOD UREA NITROGEN: 74 mg/dL — ABNORMAL HIGH (ref 9–23)
BUN / CREAT RATIO: 11
CALCIUM: 9.4 mg/dL (ref 8.7–10.4)
CHLORIDE: 104 mmol/L (ref 98–107)
CO2: 21 mmol/L (ref 20.0–31.0)
CREATININE: 6.67 mg/dL — ABNORMAL HIGH
EGFR CKD-EPI (2021) MALE: 9 mL/min/{1.73_m2} — ABNORMAL LOW (ref >=60)
GLUCOSE RANDOM: 241 mg/dL — ABNORMAL HIGH (ref 70–179)
POTASSIUM: 4.4 mmol/L (ref 3.4–4.8)
SODIUM: 135 mmol/L (ref 135–145)

## 2022-05-10 LAB — MAGNESIUM: MAGNESIUM: 2.2 mg/dL (ref 1.6–2.6)

## 2022-05-10 LAB — PHOSPHORUS: PHOSPHORUS: 6 mg/dL — ABNORMAL HIGH (ref 2.4–5.1)

## 2022-05-10 MED ADMIN — pregabalin (LYRICA) capsule 100 mg: 100 mg | ORAL | @ 02:00:00

## 2022-05-10 MED ADMIN — isosorbide dinitrate (ISORDIL) tablet 20 mg: 20 mg | ORAL | @ 02:00:00

## 2022-05-10 MED ADMIN — pantoprazole (Protonix) EC tablet 40 mg: 40 mg | ORAL | @ 14:00:00

## 2022-05-10 MED ADMIN — apixaban (ELIQUIS) tablet 5 mg: 5 mg | ORAL | @ 14:00:00

## 2022-05-10 MED ADMIN — atorvastatin (LIPITOR) tablet 20 mg: 20 mg | ORAL | @ 02:00:00

## 2022-05-10 MED ADMIN — insulin glargine (LANTUS) injection 20 Units: 20 [IU] | SUBCUTANEOUS | @ 03:00:00

## 2022-05-10 MED ADMIN — pregabalin (LYRICA) capsule 100 mg: 100 mg | ORAL | @ 21:00:00

## 2022-05-10 MED ADMIN — empagliflozin (JARDIANCE) tablet 10 mg: 10 mg | ORAL | @ 14:00:00 | Stop: 2022-05-10

## 2022-05-10 MED ADMIN — sodium bicarbonate tablet 1,300 mg: 1300 mg | ORAL | @ 14:00:00

## 2022-05-10 MED ADMIN — sodium bicarbonate tablet 1,300 mg: 1300 mg | ORAL | @ 02:00:00

## 2022-05-10 MED ADMIN — insulin lispro (HumaLOG) injection 13 Units: 13 [IU] | SUBCUTANEOUS | @ 14:00:00

## 2022-05-10 MED ADMIN — insulin lispro (HumaLOG) injection 13 Units: 13 [IU] | SUBCUTANEOUS | @ 17:00:00

## 2022-05-10 MED ADMIN — pregabalin (LYRICA) capsule 100 mg: 100 mg | ORAL | @ 14:00:00

## 2022-05-10 MED ADMIN — sodium zirconium cyclosilicate (LOKELMA) packet 10 g: 10 g | ORAL | @ 02:00:00

## 2022-05-10 MED ADMIN — NIFEdipine (PROCARDIA XL) 24 hr tablet 30 mg: 30 mg | ORAL | @ 14:00:00

## 2022-05-10 MED ADMIN — sodium zirconium cyclosilicate (LOKELMA) packet 10 g: 10 g | ORAL | @ 14:00:00

## 2022-05-10 MED ADMIN — folic acid (FOLVITE) tablet 1 mg: 1 mg | ORAL | @ 14:00:00

## 2022-05-10 MED ADMIN — apixaban (ELIQUIS) tablet 5 mg: 5 mg | ORAL | @ 02:00:00

## 2022-05-10 MED ADMIN — sodium bicarbonate tablet 1,300 mg: 1300 mg | ORAL | @ 21:00:00

## 2022-05-10 MED ADMIN — insulin glargine (LANTUS) injection 20 Units: 20 [IU] | SUBCUTANEOUS | @ 14:00:00

## 2022-05-10 NOTE — Unmapped (Cosign Needed)
Cardiology - Team 2 [facility]) Progress Note    Assessment & Plan:   Colin Hunter is a 52 y.o. 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     Principal Problem:    Severe pulmonary valve insufficiency  Active Problems:    Atrial fibrillation (CMS-HCC)    Chronic myeloid leukemia (CMS-HCC)    CKD (chronic kidney disease) stage 3, GFR 30-59 ml/min (CMS-HCC)    Congenital pulmonary valve stenosis    Essential hypertension    Gastroesophageal reflux disease    Heart failure with reduced ejection fraction (CMS-HCC)    Type 2 diabetes mellitus treated with insulin (CMS-HCC)  Resolved Problems:    * No resolved hospital problems. *    Active Problems    AKI on CKD - Metabolic acidosis - Hyperkalemia - Hyponatremia  His baseline creatinine is 2.5-3. Cr 3.43 on admission but continuing to rise in the context of recent large contrast load. Cr peaked at 7.54 on 1/29 and has begun to downtrend. Prior to 1/28, patient had very minimal urine output over the previous concerning that dialysis may be necessary within the coming days. Over the past 48 hours he has had substantial urine output, net neg 5L yesterday. Will hold diuretics and Jardiance today given significant diuresis and concern for post-diuresis ATN.  - Nephrology consult, appreciate continued recommendations  - Holding diuretics   - Continue lokelma 10 g BID  - Continue sodium bicarb 1300 mg TID  - Avoid nephrotoxic agents  - Renally dose medications  - Trend BMP   - Strict intake/output     Severe pulmonary insufficiency s/p pulmonary valve replacement  - RV dilation and failure - HFrEF (EF >55% Nov 2023)  History of congenital pulmonary valve stenosis s/p surgical valvotomy as a child that has been complicated by chronic moderate to severe pulmonary valve insufficiency and dilated RV with reduced function. Patient is s/p placement of pulmonary valve replacement (25 mm Harmony) 1/25 and has been recovering appropriately. Routine post-op care maintained and follow up imaging shows adequate function of the prosthesis.   - Continue home apixaban 5mg  BID today   - Continue telemetry   - Ziopatch prior to discharge     Hypertension  Blood pressure has been more controlled with the addition of nifedipine 30 mg daily. Will discontinue Isordil and titrate nifedipine as needed.   - Start nifedipine 30 mg daily       Chronic Problems  CML: Hold inpatient asciminib 40 mg BID, if longer hospitalization will need hematology to order  HLD: continue rosuvastatin 20 mg daily  T2DM: at home taking 40 units Lantus BID, 26 units Lispro TID, and Jardiance. Continuing 13 units Lispro with meals, and will half Lantus to 20 units BID. Holding home Jardiance given creatinine.  Atrial Flutter: Continue metoprolol tartrate 25 mg daily, apixaban 5 mg BID     Issues Impacting Complexity of Management:  -The patient is at high risk of complications from contrast administration in existing CKD    Daily Checklist:  Diet: Regular Diet  DVT PPx: Patient Already on Full Anticoagulation with apixaban  Electrolytes: No Repletion Needed  Code Status: Full Code  Dispo:  Continue floor care.  Discharge postponed given evidence of ongoing renal injury     Team Contact Information:   Primary Team: Cardiology - Team 2 [facility])  Primary Resident: Marilu Favre, MD    Interval History:   No acute events overnight. He had a large amount of urine output over the last 24 hours and continues to feel well this morning. No acute concerns.    All other systems were reviewed and are negative except as noted in the HPI    Objective:   Temp:  [36.7 ??C (98.1 ??F)-37.1 ??C (98.8 ??F)] 36.9 ??C (98.4 ??F)  Heart Rate:  [61-105] 92  Resp:  [18-20] 18  BP: (127-149)/(87-99) 149/99  SpO2:  [95 %-100 %] 98 %    Gen: NAD, converses appropriately, sitting at the edge of the bed  HEENT: atraumatic, normocephalic, EOM grossly intact, pupils equal  Heart: RRR  Lungs: CTAB, no crackles or wheezes  Abdomen: soft, NTND, L groin access clean/dry/intact, gauze in place over R groin site  Extremities: No peripheral edema noted bilaterally      Marilu Favre, DO  Internal Medicine Resident, PGY-2

## 2022-05-10 NOTE — Unmapped (Signed)
S/p pulmonary valve replacement.  Bilateral groin sites benign; OTA.  Denied any discomfort.  Has been ST on telemetry.  SBP in the low 100s.  O2 saturation noted to be 100% on room air.  Got a little unsteady while using the bathroom early on this shift.  Bed alarm turned on for this reason.  Encouraged to call for assistance with transfers.  Had a large BM after Mag Citrate that had been given during the day yesterday.  Abdomen still bloated.  Possible discharge today.  POC reviewed.  Problem: Adult Inpatient Plan of Care  Goal: Plan of Care Review  Outcome: Ongoing - Unchanged  Goal: Patient-Specific Goal (Individualized)  Outcome: Ongoing - Unchanged  Goal: Absence of Hospital-Acquired Illness or Injury  Outcome: Ongoing - Unchanged  Intervention: Identify and Manage Fall Risk  Recent Flowsheet Documentation  Taken 05/09/2022 2353 by Reynold Bowen, RN  Safety Interventions: fall reduction program maintained  Taken 05/09/2022 1935 by Reynold Bowen, RN  Safety Interventions:   low bed   lighting adjusted for tasks/safety   nonskid shoes/slippers when out of bed  Intervention: Prevent Skin Injury  Recent Flowsheet Documentation  Taken 05/10/2022 0158 by Reynold Bowen, RN  Positioning for Skin: Supine/Back  Taken 05/09/2022 2353 by Reynold Bowen, RN  Positioning for Skin: Supine/Back  Taken 05/09/2022 2155 by Reynold Bowen, RN  Positioning for Skin: (dangle) Other (Comment)  Taken 05/09/2022 2013 by Reynold Bowen, RN  Positioning for Skin: (sitting on the edge of bed) Other (Comment)  Taken 05/09/2022 1935 by Reynold Bowen, RN  Positioning for Skin: Left  Goal: Optimal Comfort and Wellbeing  Outcome: Ongoing - Unchanged  Goal: Readiness for Transition of Care  Outcome: Ongoing - Unchanged  Goal: Rounds/Family Conference  Outcome: Ongoing - Unchanged     Problem: Skin Injury Risk Increased  Goal: Skin Health and Integrity  Outcome: Ongoing - Unchanged  Intervention: Optimize Skin Protection  Recent Flowsheet Documentation  Taken 05/10/2022 0158 by Reynold Bowen, RN  Activity Management: bedrest  Taken 05/09/2022 2353 by Reynold Bowen, RN  Activity Management: bedrest  Pressure Reduction Techniques: frequent weight shift encouraged  Taken 05/09/2022 2155 by Reynold Bowen, RN  Activity Management: sitting, edge of bed  Taken 05/09/2022 2013 by Reynold Bowen, RN  Activity Management: sitting, edge of bed  Taken 05/09/2022 1935 by Reynold Bowen, RN  Activity Management: bedrest  Pressure Reduction Techniques: frequent weight shift encouraged  Head of Bed (HOB) Positioning: HOB at 30-45 degrees     Problem: Fall Injury Risk  Goal: Absence of Fall and Fall-Related Injury  Outcome: Ongoing - Unchanged  Intervention: Promote Injury-Free Environment  Recent Flowsheet Documentation  Taken 05/09/2022 2353 by Reynold Bowen, RN  Safety Interventions: fall reduction program maintained  Taken 05/09/2022 1935 by Reynold Bowen, RN  Safety Interventions:   low bed   lighting adjusted for tasks/safety   nonskid shoes/slippers when out of bed     Problem: Wound  Goal: Optimal Coping  Outcome: Ongoing - Unchanged  Goal: Optimal Functional Ability  Outcome: Ongoing - Unchanged  Intervention: Optimize Functional Ability  Recent Flowsheet Documentation  Taken 05/10/2022 0158 by Reynold Bowen, RN  Activity Management: bedrest  Taken 05/09/2022 2353 by Reynold Bowen, RN  Activity Management: bedrest  Taken 05/09/2022 2155 by Reynold Bowen, RN  Activity Management: sitting, edge of bed  Taken 05/09/2022 2013 by Reynold Bowen, RN  Activity Management:  sitting, edge of bed  Taken 05/09/2022 1935 by Reynold Bowen, RN  Activity Management: bedrest  Goal: Absence of Infection Signs and Symptoms  Outcome: Ongoing - Unchanged  Goal: Improved Oral Intake  Outcome: Ongoing - Unchanged  Goal: Optimal Pain Control and Function  Outcome: Ongoing - Unchanged  Goal: Skin Health and Integrity  Outcome: Ongoing - Unchanged  Intervention: Optimize Skin Protection  Recent Flowsheet Documentation  Taken 05/10/2022 0158 by Reynold Bowen, RN  Activity Management: bedrest  Taken 05/09/2022 2353 by Reynold Bowen, RN  Activity Management: bedrest  Pressure Reduction Techniques: frequent weight shift encouraged  Taken 05/09/2022 2155 by Reynold Bowen, RN  Activity Management: sitting, edge of bed  Taken 05/09/2022 2013 by Reynold Bowen, RN  Activity Management: sitting, edge of bed  Taken 05/09/2022 1935 by Reynold Bowen, RN  Activity Management: bedrest  Pressure Reduction Techniques: frequent weight shift encouraged  Head of Bed (HOB) Positioning: HOB at 30-45 degrees  Goal: Optimal Wound Healing  Outcome: Ongoing - Unchanged     Problem: Comorbidity Management  Goal: Blood Glucose Levels Within Targeted Range  Outcome: Ongoing - Unchanged  Goal: Maintenance of Heart Failure Symptom Control  Outcome: Ongoing - Unchanged  Goal: Blood Pressure in Desired Range  Outcome: Ongoing - Unchanged     Problem: Heart Failure  Goal: Optimal Coping  Outcome: Ongoing - Unchanged  Goal: Optimal Cardiac Output  Outcome: Ongoing - Unchanged  Goal: Stable Heart Rate and Rhythm  Outcome: Ongoing - Unchanged  Goal: Optimal Functional Ability  Outcome: Ongoing - Unchanged  Intervention: Optimize Functional Ability  Recent Flowsheet Documentation  Taken 05/10/2022 0158 by Reynold Bowen, RN  Activity Management: bedrest  Taken 05/09/2022 2353 by Reynold Bowen, RN  Activity Management: bedrest  Taken 05/09/2022 2155 by Reynold Bowen, RN  Activity Management: sitting, edge of bed  Taken 05/09/2022 2013 by Reynold Bowen, RN  Activity Management: sitting, edge of bed  Taken 05/09/2022 1935 by Reynold Bowen, RN  Activity Management: bedrest  Goal: Fluid and Electrolyte Balance  Outcome: Ongoing - Unchanged  Goal: Improved Oral Intake  Outcome: Ongoing - Unchanged  Goal: Effective Oxygenation and Ventilation  Outcome: Ongoing - Unchanged  Intervention: Promote Airway Secretion Clearance  Recent Flowsheet Documentation  Taken 05/10/2022 0158 by Reynold Bowen, RN  Activity Management: bedrest  Taken 05/09/2022 2353 by Reynold Bowen, RN  Activity Management: bedrest  Taken 05/09/2022 2155 by Reynold Bowen, RN  Activity Management: sitting, edge of bed  Taken 05/09/2022 2013 by Reynold Bowen, RN  Activity Management: sitting, edge of bed  Taken 05/09/2022 1935 by Reynold Bowen, RN  Activity Management: bedrest  Intervention: Optimize Oxygenation and Ventilation  Recent Flowsheet Documentation  Taken 05/09/2022 1935 by Reynold Bowen, RN  Head of Bed [facility]) Positioning: HOB at 30-45 degrees  Goal: Effective Breathing Pattern During Sleep  Outcome: Ongoing - Unchanged

## 2022-05-11 LAB — BASIC METABOLIC PANEL
ANION GAP: 8 mmol/L (ref 5–14)
BLOOD UREA NITROGEN: 70 mg/dL — ABNORMAL HIGH (ref 9–23)
BUN / CREAT RATIO: 14
CALCIUM: 8.9 mg/dL (ref 8.7–10.4)
CHLORIDE: 104 mmol/L (ref 98–107)
CO2: 25 mmol/L (ref 20.0–31.0)
CREATININE: 5.18 mg/dL — ABNORMAL HIGH
EGFR CKD-EPI (2021) MALE: 13 mL/min/{1.73_m2} — ABNORMAL LOW (ref >=60)
GLUCOSE RANDOM: 239 mg/dL — ABNORMAL HIGH (ref 70–179)
POTASSIUM: 4.4 mmol/L (ref 3.4–4.8)
SODIUM: 137 mmol/L (ref 135–145)

## 2022-05-11 LAB — CBC
HEMATOCRIT: 40 % (ref 39.0–48.0)
HEMOGLOBIN: 13.6 g/dL (ref 12.9–16.5)
MEAN CORPUSCULAR HEMOGLOBIN CONC: 34.1 g/dL (ref 32.0–36.0)
MEAN CORPUSCULAR HEMOGLOBIN: 29.1 pg (ref 25.9–32.4)
MEAN CORPUSCULAR VOLUME: 85.2 fL (ref 77.6–95.7)
MEAN PLATELET VOLUME: 8.6 fL (ref 6.8–10.7)
PLATELET COUNT: 94 10*9/L — ABNORMAL LOW (ref 150–450)
RED BLOOD CELL COUNT: 4.69 10*12/L (ref 4.26–5.60)
RED CELL DISTRIBUTION WIDTH: 14.7 % (ref 12.2–15.2)
WBC ADJUSTED: 7.8 10*9/L (ref 3.6–11.2)

## 2022-05-11 MED ADMIN — sodium bicarbonate tablet 1,300 mg: 1300 mg | ORAL | @ 01:00:00

## 2022-05-11 MED ADMIN — folic acid (FOLVITE) tablet 1 mg: 1 mg | ORAL | @ 14:00:00

## 2022-05-11 MED ADMIN — polyethylene glycol (MIRALAX) packet 17 g: 17 g | ORAL | @ 03:00:00

## 2022-05-11 MED ADMIN — insulin lispro (HumaLOG) injection 13 Units: 13 [IU] | SUBCUTANEOUS | @ 14:00:00

## 2022-05-11 MED ADMIN — apixaban (ELIQUIS) tablet 5 mg: 5 mg | ORAL | @ 01:00:00

## 2022-05-11 MED ADMIN — pregabalin (LYRICA) capsule 100 mg: 100 mg | ORAL | @ 14:00:00

## 2022-05-11 MED ADMIN — bisacodyl (DULCOLAX) suppository 10 mg: 10 mg | RECTAL | @ 12:00:00

## 2022-05-11 MED ADMIN — insulin glargine (LANTUS) injection 20 Units: 20 [IU] | SUBCUTANEOUS | @ 14:00:00

## 2022-05-11 MED ADMIN — pantoprazole (Protonix) EC tablet 40 mg: 40 mg | ORAL | @ 14:00:00

## 2022-05-11 MED ADMIN — insulin glargine (LANTUS) injection 20 Units: 20 [IU] | SUBCUTANEOUS | @ 01:00:00

## 2022-05-11 MED ADMIN — insulin lispro (HumaLOG) injection 13 Units: 13 [IU] | SUBCUTANEOUS | @ 22:00:00

## 2022-05-11 MED ADMIN — pregabalin (LYRICA) capsule 100 mg: 100 mg | ORAL | @ 01:00:00

## 2022-05-11 MED ADMIN — apixaban (ELIQUIS) tablet 5 mg: 5 mg | ORAL | @ 14:00:00

## 2022-05-11 MED ADMIN — polyethylene glycol (MIRALAX) packet 17 g: 17 g | ORAL | @ 14:00:00 | Stop: 2022-05-11

## 2022-05-11 MED ADMIN — sodium zirconium cyclosilicate (LOKELMA) packet 10 g: 10 g | ORAL | @ 01:00:00

## 2022-05-11 MED ADMIN — insulin lispro (HumaLOG) injection 13 Units: 13 [IU] | SUBCUTANEOUS | @ 01:00:00

## 2022-05-11 MED ADMIN — NIFEdipine (PROCARDIA XL) 24 hr tablet 30 mg: 30 mg | ORAL | @ 14:00:00

## 2022-05-11 MED ADMIN — insulin lispro (HumaLOG) injection 13 Units: 13 [IU] | SUBCUTANEOUS | @ 18:00:00

## 2022-05-11 MED ADMIN — atorvastatin (LIPITOR) tablet 20 mg: 20 mg | ORAL | @ 01:00:00

## 2022-05-11 MED ADMIN — sodium bicarbonate tablet 1,300 mg: 1300 mg | ORAL | @ 14:00:00 | Stop: 2022-05-11

## 2022-05-11 MED ADMIN — pregabalin (LYRICA) capsule 100 mg: 100 mg | ORAL | @ 20:00:00

## 2022-05-11 MED ADMIN — senna (SENOKOT) tablet 2 tablet: 2 | ORAL | @ 03:00:00

## 2022-05-11 NOTE — Unmapped (Cosign Needed)
Nephrology Consult Note    Requesting Attending Physician :  Derrill Kay, MD  Service Requesting Consult : Cardiology [facility])  Reason for Consult: AKI on CKD     Assessment and Plan:  [name] is a 52 yo with congenital pulmonary valve stenosis, CKDIII who is s/p transcatheter pulmonary valve placement on 1/25. Course c/b AKI.     # AKI on CKD: BL Cr 2.3 - 2.8 scheduled to establish with [facility] nephrology early next month. This hospitalization he has been noted to have a rapid rise in Cr since pulmonic valve replacement on 1/25. Timing of rising Cr appears to correlate with large contrast load. He continues to slowly worsen though this seems to be plateauing - up to 7.54 and now downtrending with excellent UOP.   - Care for contrast nephropathy is otherwise supportive with goal to maintain euvolemia, MAP >65, and avoid nephrotoxic agents as able.   - Follow-up scheduled with nephrology - will need to reschedule this pending discharge  - Given improvement today and large UOP, hold diuretics AND jardiance as will likely autodiurese as renal function improves    #HTN  Isordil 20 tid, nifedipine 30mg ; lisinopril on hold currently    # Hyponatremia,improved.  Now at 135 with diuresis (corrected ~9 in 24 hours with 5L urine output)    # Metabolic acidosis:   - trend for now, will add sodium bicarbonate if persistently <20; Improved with diuresis.   - continue sodium bicarbonate 1300mg  tid    #Hyperkalemia, stable; improved with diuretics  - lokelma 10g bid -can stop this today    # Pulmonic valve insufficiency  - S/p valve replacement 1/25    - Evaluation and management per primary team  - No changes to management from a nephrology standpoint at this time    RECOMMENDATIONS:   - Sodium bicarb 1300 mg TID - may be able to discontinue in coming days  - can stop Lokelma 10 g BID today  - Given improvement today and large UOP, hold diuretics AND jardiance as will likely autodiurese as renal function improves  - We will continue to follow.     Clyde Lundborg, MD  05/10/2022 4:06 PM     Medical decision-making for 05/10/22  Findings / Data     Patient has: []  acute illness w/systemic sxs  [mod]  []  two or more stable chronic illnesses [mod]  []  one chronic illness with acute exacerbation [mod]  []  acute complicated illness  [mod]  []  Undiagnosed new problem with uncertain prognosis  [mod] [x]  illness posing risk to life or bodily function (ex. AKI)  [high]  []  chronic illness with severe exacerbation/progression  [high]  []  chronic illness with severe side effects of treatment  [high] AKI, hyponatremia  Probs At least 2:  Probs, Data, Risk   I reviewed: [x]  primary team note  []  consultant note(s)  []  external records [x]  chemistry results  []  CBC results  []  blood gas results  []  Other []  procedure/op note(s)   [x]  radiology report(s)  []  micro result(s)  []  w/ independent historian(s) Na better, elevated Cr improving . Low PLTs ?3 Data Review (2 of 3)    I independently interpreted: []  Urine Sediment  []  Renal US []  CXR Images  []  CT Images  []  Other []  EKG Tracing  Any     I discussed: []  Pathology results w/ QHPs(s) from other specialties  []  Procedural findings w/ QHPs(s) from other specialties []  Imaging w/ QHP(s)  from other specialties  [x]  Treatment plan w/ QHP(s) from other specialties Plan discussed with primary team Any     Mgm't requires: [x]  Prescription drug(s)  [mod]  []  Kidney biopsy  [mod]  []  Central line placement  [mod] [x]  High risk medication use and/or intensive toxicity monitoring [high]  []  Renal replacement therapy [high]  []  High risk kidney biopsy  [high]  []  Escalation of care  [high]  []  High risk central line placement  [high] IV diuretics, lokelma Risk      ____________________________________________________    Interval Hx   Feeling well today. Creatinine down-trending.    History of Present Illness: Colin Hunter is 52 y.o. male with CKDIII, pulmonic insufficiency who is seen in consultation at the request of Derrill Kay, MD and Cardiology [facility]). Nephrology has been consulted for AKI.   Background:  Pt admitted 05/05/2022 and underwent pulmonic valve replacement. Since  then Cr has rapidly risen from 3.4 (BL 2.3-2.8) to 6 as of time of consult. He reports feeling generally well and better since his valve was replaced. He has noticed he is urinating a lot less. He feels like he can get the urine out. He denies SOB or edema.      INPATIENT MEDICATIONS:    Current Facility-Administered Medications:     acetaminophen (TYLENOL) tablet 650 mg, Oral, Q6H PRN    albuterol (PROVENTIL HFA;VENTOLIN HFA) 90 mcg/actuation inhaler 2 puff, Inhalation, Q6H PRN    apixaban (ELIQUIS) tablet 5 mg, Oral, BID    atorvastatin (LIPITOR) tablet 20 mg, Oral, Nightly    dextrose 50 % in water (D50W) 50 % solution 12.5 g, Intravenous, Q10 Min PRN    diclofenac sodium (VOLTAREN) 1 % gel 2 g, Topical, QID    folic acid (FOLVITE) tablet 1 mg, Oral, Daily    glucagon injection 1 mg, Intramuscular, Once PRN    glucose chewable tablet 16 g, Oral, Q10 Min PRN    hydrALAZINE (APRESOLINE) injection 10 mg, Intravenous, Q6H PRN    insulin glargine (LANTUS) injection 20 Units, Subcutaneous, BID    insulin lispro (HumaLOG) injection 13 Units, Subcutaneous, TID AC    lidocaine 4 % patch 1 patch, Transdermal, Daily    NIFEdipine (PROCARDIA XL) 24 hr tablet 30 mg, Oral, Daily    pantoprazole (Protonix) EC tablet 40 mg, Oral, Daily    pregabalin (LYRICA) capsule 100 mg, Oral, TID    sodium bicarbonate tablet 1,300 mg, Oral, TID    sodium zirconium cyclosilicate (LOKELMA) packet 10 g, Oral, BID    OUTPATIENT MEDICATIONS:  Prior to Admission medications    Medication Dose, Route, Frequency   apixaban (ELIQUIS) 5 mg Tab 5 mg, Oral, 2 times a day (standard)   asciminib (SCEMBLIX) 40 mg tablet Take 1 tablet (40 mg total) by mouth Two (2) times a day. Take on an empty stomach, at least 1 hour before or 2 hours after a meal. Swallow tablets whole. Do not break, crush, or chew the tablets.   folic acid (FOLVITE) 1 MG tablet 1 mg, Oral, Daily   insulin lispro (HUMALOG) 100 unit/mL injection pen INJECT 26 UNITS THREE TIMES DAILY BEFORE MEAL(S)   JARDIANCE 10 mg tablet 10 mg, Oral, Daily (standard)   metOLazone (ZAROXOLYN) 2.5 MG tablet 2.5 mg, Oral, Daily (standard)   metoprolol succinate (TOPROL-XL) 100 MG 24 hr tablet 100 mg, Oral, Daily (standard)   OZEMPIC 0.25 mg or 0.5 mg(2 mg/1.5 mL) PnIj injection 0.5 mg, Subcutaneous, Every 7 days   pantoprazole (  PROTONIX) 40 MG tablet 40 mg, Oral, Daily (standard)   pregabalin (LYRICA) 100 MG capsule 100 mg, Oral, 3 times a day (standard)   torsemide 40 mg Tab 40 mg, Oral, Daily PRN   VENTOLIN HFA 90 mcg/actuation inhaler 2 puffs, Inhalation, Every 6 hours PRN   BINAXNOW COVID-19 AG SELF TEST Kit    blood pressure monitor Kit 1 each, Miscellaneous, Daily   blood sugar diagnostic Strp    blood-glucose meter Misc    insulin glargine (BASAGLAR, LANTUS) 100 unit/mL (3 mL) injection pen 40 Units, Subcutaneous, 2 times a day (standard)   lancets Misc Miscellaneous   ONETOUCH VERIO TEST STRIPS Strp USE 1 STRIP TO CHECK GLUCOSE TWICE DAILY   rosuvastatin (CRESTOR) 20 MG tablet 20 mg, Oral, Daily (standard)        ALLERGIES:  Patient has no known allergies.    MEDICAL HISTORY:  Past Medical History:   Diagnosis Date    CKD (chronic kidney disease) stage 3, GFR 30-59 ml/min (CMS-HCC)     CML (chronic myelocytic leukemia) (CMS-HCC)     HFrEF (heart failure with reduced ejection fraction) (CMS-HCC)     Paroxysmal atrial fibrillation (CMS-HCC)     PFO (patent foramen ovale)     Right atrial thrombus     T2DM (type 2 diabetes mellitus) (CMS-HCC)      Past Surgical History:   Procedure Laterality Date    PR COMPRE EP EVAL ABLTJ 3D MAPG TX SVT N/A 12/03/2020    Procedure: A-Flutter Ablation;  Surgeon: Dorcas Carrow, MD;  Location: [facility] EP;  Service: Cardiology    PR RIGHT HEART CATH O2 SATURATION & CARDIAC OUTPUT N/A 11/30/2020    Procedure: Right Heart Catheterization;  Surgeon: Marlaine Hind, MD;  Location: [facility] CATH;  Service: Cardiology    PR TCAT PULMONARY VALVE IMPLANTATION PRQ APPROACH N/A 05/05/2022    Procedure: Peds TPVR;  Surgeon: Charlann Noss, DO;  Location: [facility] CATH/EP;  Service: Cardiology     SOCIAL HISTORY  Social History     Social History Narrative    Not on file      reports that he quit smoking about 3 years ago. His smoking use included cigarettes. He started smoking about 9 years ago. He has a 3.0 pack-year smoking history. He has never used smokeless tobacco. He reports that he does not drink alcohol and does not use drugs.   FAMILY HISTORY  Family History   Problem Relation Age of Onset    Heart disease Father         Physical Exam:   Vitals:    05/09/22 2350 05/10/22 0135 05/10/22 0700 05/10/22 0828   BP: 139/87   149/99   Pulse: 102 98 86 92   Resp: 18   18   Temp: 37.1 ??C (98.8 ??F)   36.9 ??C (98.4 ??F)   TempSrc: Oral   Oral   SpO2: 95%   98%   Weight:       Height:         I/O this shift:  In: 920 [P.O.:920]  Out: 2950 [Urine:2950]    Intake/Output Summary (Last 24 hours) at 05/10/2022 1606  Last data filed at 05/10/2022 1515  Gross per 24 hour   Intake 1520 ml   Output 6125 ml   Net -4605 ml       Constitutional: well-appearing, no acute distress, sitting up eating breakfast  Heart: RRR; JVP at mid neck  Lungs: CTAB, normal wob  Abd: soft, non-tender, non-distended  Ext: trace + edema

## 2022-05-11 NOTE — Unmapped (Signed)
Nephrology Consult Note    Requesting Attending Physician :  Dorcas Carrow, MD  Service Requesting Consult : Cardiology [facility])  Reason for Consult: AKI on CKD     Assessment and Plan:  [name] is a 52 yo with congenital pulmonary valve stenosis, CKDIII who is s/p transcatheter pulmonary valve placement on 1/25. Course c/b AKI.     # AKI on CKD: BL Cr 2.3 - 2.8 scheduled to establish with [facility] nephrology early next month. This hospitalization he has been noted to have a rapid rise in Cr since pulmonic valve replacement on 1/25. Timing of rising Cr appears to correlate with large contrast load. Peaked at 7.54 and now downtrending with excellent UOP.   - Care for contrast nephropathy is otherwise supportive with goal to maintain euvolemia, MAP >65, and avoid nephrotoxic agents as able.   - Would monitor UOP over next day or so as may need volume repletion if continues to make large amounts of urine as kidneys recover  - Follow-up scheduled with nephrology - we will reschedule this pending discharge  - Hold diuretics AND jardiance as will likely autodiurese as renal function improves; would discharge OFF these medications and consider restart outpatient    #HTN  Isordil 20 tid, nifedipine 30mg ; lisinopril on hold currently and would hold on discharge and plan to restart outpatient    # Hyponatremia, resolved    # Metabolic acidosis, resolved  Can stop sodium bicarbonate 1300mg  tid    #Hyperkalemia, stable; improved with diuretics    # Pulmonic valve insufficiency  - S/p valve replacement 1/25    - Evaluation and management per primary team  - No changes to management from a nephrology standpoint at this time    RECOMMENDATIONS:   - Stop bicarb tablets.  - continue to hold lisinopril, jardiance on discharge  -Monitor UOP over next day and may need to provide fluids back tomorrow if ongoing large volume UOP. Anticipate as UOP normalizes, will be stable to discharge  - Follow up is tentatively arranged on 2/6 at [facility]  - We will sign-off.     Clyde Lundborg, MD  05/11/2022 12:16 PM     Medical decision-making for 05/11/22  Findings / Data     Patient has: []  acute illness w/systemic sxs  [mod]  []  two or more stable chronic illnesses [mod]  []  one chronic illness with acute exacerbation [mod]  []  acute complicated illness  [mod]  []  Undiagnosed new problem with uncertain prognosis  [mod] [x]  illness posing risk to life or bodily function (ex. AKI)  [high]  []  chronic illness with severe exacerbation/progression  [high]  []  chronic illness with severe side effects of treatment  [high] AKI, hyponatremia  Probs At least 2:  Probs, Data, Risk   I reviewed: [x]  primary team note  []  consultant note(s)  []  external records [x]  chemistry results  []  CBC results  []  blood gas results  []  Other []  procedure/op note(s)   [x]  radiology report(s)  []  micro result(s)  []  w/ independent historian(s) Na better, elevated Cr improving . Low PLTs ?3 Data Review (2 of 3)    I independently interpreted: []  Urine Sediment  []  Renal US []  CXR Images  []  CT Images  []  Other []  EKG Tracing  Any     I discussed: []  Pathology results w/ QHPs(s) from other specialties  []  Procedural findings w/ QHPs(s) from other specialties []  Imaging w/ QHP(s) from other specialties  [x]  Treatment plan  w/ QHP(s) from other specialties Plan discussed with primary team Any     Mgm't requires: [x]  Prescription drug(s)  [mod]  []  Kidney biopsy  [mod]  []  Central line placement  [mod] [x]  High risk medication use and/or intensive toxicity monitoring [high]  []  Renal replacement therapy [high]  []  High risk kidney biopsy  [high]  []  Escalation of care  [high]  []  High risk central line placement  [high] IV diuretics, lokelma Risk      ____________________________________________________    Interval Hx   Very sleepy today but otherwise feeling well.    History of Present Illness: Colin Hunter is 52 y.o. male with CKDIII, pulmonic insufficiency who is seen in consultation at the request of Dorcas Carrow, MD and Cardiology [facility]). Nephrology has been consulted for AKI.   Background:  Pt admitted 05/05/2022 and underwent pulmonic valve replacement. Since  then Cr has rapidly risen from 3.4 (BL 2.3-2.8) to 6 as of time of consult. He reports feeling generally well and better since his valve was replaced. He has noticed he is urinating a lot less. He feels like he can get the urine out. He denies SOB or edema.      INPATIENT MEDICATIONS:    Current Facility-Administered Medications:     acetaminophen (TYLENOL) tablet 650 mg, Oral, Q6H PRN    albuterol (PROVENTIL HFA;VENTOLIN HFA) 90 mcg/actuation inhaler 2 puff, Inhalation, Q6H PRN    apixaban (ELIQUIS) tablet 5 mg, Oral, BID    atorvastatin (LIPITOR) tablet 20 mg, Oral, Nightly    bisacodyl (DULCOLAX) suppository 10 mg, Rectal, Daily PRN    dextrose 50 % in water (D50W) 50 % solution 12.5 g, Intravenous, Q10 Min PRN    diclofenac sodium (VOLTAREN) 1 % gel 2 g, Topical, QID    folic acid (FOLVITE) tablet 1 mg, Oral, Daily    glucagon injection 1 mg, Intramuscular, Once PRN    glucose chewable tablet 16 g, Oral, Q10 Min PRN    hydrALAZINE (APRESOLINE) injection 10 mg, Intravenous, Q6H PRN    insulin glargine (LANTUS) injection 20 Units, Subcutaneous, BID    insulin lispro (HumaLOG) injection 13 Units, Subcutaneous, TID AC    lidocaine 4 % patch 1 patch, Transdermal, Daily    NIFEdipine (PROCARDIA XL) 24 hr tablet 30 mg, Oral, Daily    pantoprazole (Protonix) EC tablet 40 mg, Oral, Daily    polyethylene glycol (MIRALAX) packet 17 g, Oral, Daily    pregabalin (LYRICA) capsule 100 mg, Oral, TID    senna (SENOKOT) tablet 2 tablet, Oral, Nightly    OUTPATIENT MEDICATIONS:  Prior to Admission medications    Medication Dose, Route, Frequency   apixaban (ELIQUIS) 5 mg Tab 5 mg, Oral, 2 times a day (standard)   asciminib (SCEMBLIX) 40 mg tablet Take 1 tablet (40 mg total) by mouth Two (2) times a day. Take on an empty stomach, at least 1 hour before or 2 hours after a meal. Swallow tablets whole. Do not break, crush, or chew the tablets.   folic acid (FOLVITE) 1 MG tablet 1 mg, Oral, Daily   insulin lispro (HUMALOG) 100 unit/mL injection pen INJECT 26 UNITS THREE TIMES DAILY BEFORE MEAL(S)   JARDIANCE 10 mg tablet 10 mg, Oral, Daily (standard)   metOLazone (ZAROXOLYN) 2.5 MG tablet 2.5 mg, Oral, Daily (standard)   metoprolol succinate (TOPROL-XL) 100 MG 24 hr tablet 100 mg, Oral, Daily (standard)   OZEMPIC 0.25 mg or 0.5 mg(2 mg/1.5 mL) PnIj injection 0.5 mg, Subcutaneous, Every  7 days   pantoprazole (PROTONIX) 40 MG tablet 40 mg, Oral, Daily (standard)   pregabalin (LYRICA) 100 MG capsule 100 mg, Oral, 3 times a day (standard)   torsemide 40 mg Tab 40 mg, Oral, Daily PRN   VENTOLIN HFA 90 mcg/actuation inhaler 2 puffs, Inhalation, Every 6 hours PRN   BINAXNOW COVID-19 AG SELF TEST Kit    blood pressure monitor Kit 1 each, Miscellaneous, Daily   blood sugar diagnostic Strp    blood-glucose meter Misc    insulin glargine (BASAGLAR, LANTUS) 100 unit/mL (3 mL) injection pen 40 Units, Subcutaneous, 2 times a day (standard)   lancets Misc Miscellaneous   ONETOUCH VERIO TEST STRIPS Strp USE 1 STRIP TO CHECK GLUCOSE TWICE DAILY   rosuvastatin (CRESTOR) 20 MG tablet 20 mg, Oral, Daily (standard)        ALLERGIES:  Patient has no known allergies.    MEDICAL HISTORY:  Past Medical History:   Diagnosis Date    CKD (chronic kidney disease) stage 3, GFR 30-59 ml/min (CMS-HCC)     CML (chronic myelocytic leukemia) (CMS-HCC)     HFrEF (heart failure with reduced ejection fraction) (CMS-HCC)     Paroxysmal atrial fibrillation (CMS-HCC)     PFO (patent foramen ovale)     Right atrial thrombus     T2DM (type 2 diabetes mellitus) (CMS-HCC)      Past Surgical History:   Procedure Laterality Date    PR COMPRE EP EVAL ABLTJ 3D MAPG TX SVT N/A 12/03/2020    Procedure: A-Flutter Ablation;  Surgeon: Dorcas Carrow, MD;  Location: [facility] EP;  Service: Cardiology    PR RIGHT HEART CATH O2 SATURATION & CARDIAC OUTPUT N/A 11/30/2020    Procedure: Right Heart Catheterization;  Surgeon: Marlaine Hind, MD;  Location: [facility] CATH;  Service: Cardiology    PR TCAT PULMONARY VALVE IMPLANTATION PRQ APPROACH N/A 05/05/2022    Procedure: Peds TPVR;  Surgeon: Charlann Noss, DO;  Location: [facility] PEDS CATH/EP;  Service: Cardiology     SOCIAL HISTORY  Social History     Social History Narrative    Not on file      reports that he quit smoking about 3 years ago. His smoking use included cigarettes. He started smoking about 9 years ago. He has a 3.0 pack-year smoking history. He has never used smokeless tobacco. He reports that he does not drink alcohol and does not use drugs.   FAMILY HISTORY  Family History   Problem Relation Age of Onset    Heart disease Father         Physical Exam:   Vitals:    05/10/22 2343 05/11/22 0350 05/11/22 0800 05/11/22 0801   BP: 135/82  149/90    Pulse: 93 87 87 94   Resp: 17  16    Temp: 36.8 ??C (98.2 ??F)  36.7 ??C (98.1 ??F)    TempSrc: Oral  Oral    SpO2: 100%  95%    Weight:       Height:         I/O this shift:  In: 0   Out: 1200 [Urine:1200]    Intake/Output Summary (Last 24 hours) at 05/11/2022 1216  Last data filed at 05/11/2022 1150  Gross per 24 hour   Intake 640 ml   Output 4225 ml   Net -3585 ml       Constitutional: well-appearing, no acute distress sleeping  Heart: warm and well perfused  Lungs: normal wob  Abd:  non-distended  Ext: trace + edema

## 2022-05-11 NOTE — Unmapped (Signed)
S/p pulmonary valve replacement.  Bilateral groin sites benign; OTA.  Denied any discomfort.  Has been SR on telemetry.  SBP in the 130s.  O2 saturation noted to be 100% on room air.  Gait steady.  Ambulated outside the room without difficulty.  Bed alarm turned on for this reason.  Encouraged to call for assistance with transfers.  Given Senna and Miralax for constipation.  Abdomen still bloated.  POC reviewed.        Problem: Adult Inpatient Plan of Care  Goal: Plan of Care Review  Outcome: Ongoing - Unchanged  Goal: Patient-Specific Goal (Individualized)  Outcome: Ongoing - Unchanged  Goal: Absence of Hospital-Acquired Illness or Injury  Outcome: Ongoing - Unchanged  Intervention: Identify and Manage Fall Risk  Recent Flowsheet Documentation  Taken 05/10/2022 2000 by Reynold Bowen, RN  Safety Interventions:   bed alarm   fall reduction program maintained   lighting adjusted for tasks/safety   low bed  Intervention: Prevent Skin Injury  Recent Flowsheet Documentation  Taken 05/11/2022 0000 by Reynold Bowen, RN  Positioning for Skin: (sitting on the edge of bed) Other (Comment)  Taken 05/10/2022 2210 by Reynold Bowen, RN  Positioning for Skin: (sitting on the edge of bed) Other (Comment)  Taken 05/10/2022 2000 by Reynold Bowen, RN  Positioning for Skin: (sitting on the edge of bed) Other (Comment)  Goal: Optimal Comfort and Wellbeing  Outcome: Ongoing - Unchanged  Goal: Readiness for Transition of Care  Outcome: Ongoing - Unchanged  Goal: Rounds/Family Conference  Outcome: Ongoing - Unchanged     Problem: Skin Injury Risk Increased  Goal: Skin Health and Integrity  Outcome: Ongoing - Unchanged  Intervention: Optimize Skin Protection  Recent Flowsheet Documentation  Taken 05/11/2022 0000 by Reynold Bowen, RN  Activity Management: sitting, edge of bed  Taken 05/10/2022 2210 by Reynold Bowen, RN  Activity Management: sitting, edge of bed  Taken 05/10/2022 2000 by Reynold Bowen, RN  Activity Management: sitting, edge of bed  Pressure Reduction Techniques: frequent weight shift encouraged  Pressure Reduction Devices:   pressure-redistributing mattress utilized   positioning supports utilized     Problem: Fall Injury Risk  Goal: Absence of Fall and Fall-Related Injury  Outcome: Ongoing - Unchanged  Intervention: Promote Scientist, clinical (histocompatibility and immunogenetics) Documentation  Taken 05/10/2022 2000 by Reynold Bowen, RN  Safety Interventions:   bed alarm   fall reduction program maintained   lighting adjusted for tasks/safety   low bed     Problem: Wound  Goal: Optimal Coping  Outcome: Ongoing - Unchanged  Goal: Optimal Functional Ability  Outcome: Ongoing - Unchanged  Intervention: Optimize Functional Ability  Recent Flowsheet Documentation  Taken 05/11/2022 0000 by Reynold Bowen, RN  Activity Management: sitting, edge of bed  Taken 05/10/2022 2210 by Reynold Bowen, RN  Activity Management: sitting, edge of bed  Taken 05/10/2022 2000 by Reynold Bowen, RN  Activity Management: sitting, edge of bed  Goal: Absence of Infection Signs and Symptoms  Outcome: Ongoing - Unchanged  Goal: Improved Oral Intake  Outcome: Ongoing - Unchanged  Goal: Optimal Pain Control and Function  Outcome: Ongoing - Unchanged  Goal: Skin Health and Integrity  Outcome: Ongoing - Unchanged  Intervention: Optimize Skin Protection  Recent Flowsheet Documentation  Taken 05/11/2022 0000 by Reynold Bowen, RN  Activity Management: sitting, edge of bed  Taken 05/10/2022 2210 by Reynold Bowen, RN  Activity Management: sitting, edge of bed  Taken 05/10/2022 2000 by Reynold Bowen, RN  Activity Management: sitting, edge of bed  Pressure Reduction Techniques: frequent weight shift encouraged  Pressure Reduction Devices:   pressure-redistributing mattress utilized   positioning supports utilized  Goal: Optimal Wound Healing  Outcome: Ongoing - Unchanged     Problem: Comorbidity Management  Goal: Blood Glucose Levels Within Targeted Range  Outcome: Ongoing - Unchanged  Goal: Maintenance of Heart Failure Symptom Control  Outcome: Ongoing - Unchanged  Goal: Blood Pressure in Desired Range  Outcome: Ongoing - Unchanged     Problem: Heart Failure  Goal: Optimal Coping  Outcome: Ongoing - Unchanged  Goal: Optimal Cardiac Output  Outcome: Ongoing - Unchanged  Goal: Stable Heart Rate and Rhythm  Outcome: Ongoing - Unchanged  Goal: Optimal Functional Ability  Outcome: Ongoing - Unchanged  Intervention: Optimize Functional Ability  Recent Flowsheet Documentation  Taken 05/11/2022 0000 by Reynold Bowen, RN  Activity Management: sitting, edge of bed  Taken 05/10/2022 2210 by Reynold Bowen, RN  Activity Management: sitting, edge of bed  Taken 05/10/2022 2000 by Reynold Bowen, RN  Activity Management: sitting, edge of bed  Goal: Fluid and Electrolyte Balance  Outcome: Ongoing - Unchanged  Goal: Improved Oral Intake  Outcome: Ongoing - Unchanged  Goal: Effective Oxygenation and Ventilation  Outcome: Ongoing - Unchanged  Intervention: Promote Airway Secretion Clearance  Recent Flowsheet Documentation  Taken 05/11/2022 0000 by Reynold Bowen, RN  Activity Management: sitting, edge of bed  Taken 05/10/2022 2210 by Reynold Bowen, RN  Activity Management: sitting, edge of bed  Taken 05/10/2022 2000 by Reynold Bowen, RN  Activity Management: sitting, edge of bed  Goal: Effective Breathing Pattern During Sleep  Outcome: Ongoing - Unchanged

## 2022-05-11 NOTE — Unmapped (Signed)
Cardiology - Team 2 [facility]) Progress Note    Assessment & Plan:   Colin Hunter is a 52 y.o. 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     Principal Problem:    Severe pulmonary valve insufficiency  Active Problems:    Atrial fibrillation (CMS-HCC)    Chronic myeloid leukemia (CMS-HCC)    CKD (chronic kidney disease) stage 3, GFR 30-59 ml/min (CMS-HCC)    Congenital pulmonary valve stenosis    Essential hypertension    Gastroesophageal reflux disease    Heart failure with reduced ejection fraction (CMS-HCC)    Type 2 diabetes mellitus treated with insulin (CMS-HCC)  Resolved Problems:    * No resolved hospital problems. *    Active Problems    AKI on CKD - Metabolic acidosis - Hyperkalemia - Hyponatremia  His baseline creatinine is 2.5-3. Cr 3.43 on admission but continuing to rise in the context of recent large contrast load. Prior to 1/28, patient had very minimal urine output over the previous concerning that was concerning for the need for acute dialysis. Cr peaked at 7.54 on 1/29 and has continued to downtrend. He has continued to have robust urine output despite not having diuretics over the past 48 hours. Will continue to hold diuretics and Jardiance today given significant diuresis.   - Nephrology consult, appreciate continued recommendations  - Holding diuretics   - Discontinue lokelma 10 g BID  - Discontinue sodium bicarb 1300 mg TID  - May need fluids tomorrow if ongoing large diuresis  - Plan to discharge home without diuretics and Jardiance and discuss re-starting as outpatient  - Tentative nephrology follow-up on 2/6 at [facility]    Severe pulmonary insufficiency s/p pulmonary valve replacement  - RV dilation and failure - HFrEF (EF >55% Nov 2023)  History of congenital pulmonary valve stenosis s/p surgical valvotomy as a child that has been complicated by chronic moderate to severe pulmonary valve insufficiency and dilated RV with reduced function. Patient is s/p placement of pulmonary valve replacement (25 mm Harmony) 1/25 and has been recovering appropriately. Routine post-op care maintained and follow up imaging shows adequate function of the prosthesis.   - Continue home apixaban 5mg  BID today   - Continue telemetry   - Ziopatch prior to discharge     Hypertension  Blood pressure has been more controlled with the addition of nifedipine 30 mg daily. Will discontinue Isordil and titrate nifedipine as needed.   - Continue nifedipine 30 mg daily       Chronic Problems  CML: Hold inpatient asciminib 40 mg BID, if longer hospitalization will need hematology to order  HLD: continue rosuvastatin 20 mg daily  T2DM: at home taking 40 units Lantus BID, 26 units Lispro TID, and Jardiance. Continuing 13 units Lispro with meals, and will half Lantus to 20 units BID. Holding home Jardiance given creatinine.  Atrial Flutter: Continue metoprolol tartrate 25 mg daily, apixaban 5 mg BID     Issues Impacting Complexity of Management:  -The patient is at high risk of complications from contrast administration in existing CKD    Daily Checklist:  Diet: Regular Diet  DVT PPx: Patient Already on Full Anticoagulation with apixaban  Electrolytes:  No Repletion Needed  Code Status: Full Code  Dispo: EDD 2/1    Team Contact Information:   Primary Team: Cardiology - Team 2 [facility])  Primary Resident: Marilu Favre, MD    Interval History:   No acute events overnight. Patient has no acute complaints this morning. He is continuing to urinate and notes that his urine has remained clear.     All other systems were reviewed and are negative except as noted in the HPI    Objective:   Temp:  [36.5 ??C (97.7 ??F)-36.8 ??C (98.2 ??F)] 36.7 ??C (98.1 ??F)  Heart Rate:  [87-102] 94  Resp:  [16-18] 16  BP: (135-149)/(82-95) 149/90  SpO2:  [95 %-100 %] 95 %    Gen: NAD, converses appropriately, sitting at the edge of the bed  HEENT: atraumatic, normocephalic, EOM grossly intact, pupils equal  Heart: RRR  Lungs: CTAB, no crackles or wheezes  Abdomen: soft, NTND, groin access sites clean/intact  Extremities: No peripheral edema noted bilaterally      Marilu Favre, DO  Internal Medicine Resident, PGY-2

## 2022-05-11 NOTE — Unmapped (Signed)
Remains A&Ox4. VSS. Denies pain. OOB and ambulatory in the hallways. Improving Urine output. Discharge planning ongoing.     Problem: Adult Inpatient Plan of Care  Goal: Plan of Care Review  Outcome: Progressing  Goal: Patient-Specific Goal (Individualized)  Outcome: Progressing  Goal: Absence of Hospital-Acquired Illness or Injury  Outcome: Progressing  Goal: Optimal Comfort and Wellbeing  Outcome: Progressing  Goal: Readiness for Transition of Care  Outcome: Progressing  Goal: Rounds/Family Conference  Outcome: Progressing     Problem: Skin Injury Risk Increased  Goal: Skin Health and Integrity  Outcome: Progressing     Problem: Fall Injury Risk  Goal: Absence of Fall and Fall-Related Injury  Outcome: Progressing     Problem: Heart Failure  Goal: Optimal Coping  Outcome: Progressing  Goal: Optimal Cardiac Output  Outcome: Progressing  Goal: Stable Heart Rate and Rhythm  Outcome: Progressing  Goal: Optimal Functional Ability  Outcome: Progressing  Goal: Fluid and Electrolyte Balance  Outcome: Progressing  Goal: Improved Oral Intake  Outcome: Progressing  Goal: Effective Oxygenation and Ventilation  Outcome: Progressing  Goal: Effective Breathing Pattern During Sleep  Outcome: Progressing

## 2022-05-12 LAB — BASIC METABOLIC PANEL
ANION GAP: 8 mmol/L (ref 5–14)
BLOOD UREA NITROGEN: 63 mg/dL — ABNORMAL HIGH (ref 9–23)
BUN / CREAT RATIO: 14
CALCIUM: 9.4 mg/dL (ref 8.7–10.4)
CHLORIDE: 105 mmol/L (ref 98–107)
CO2: 26 mmol/L (ref 20.0–31.0)
CREATININE: 4.37 mg/dL — ABNORMAL HIGH
EGFR CKD-EPI (2021) MALE: 16 mL/min/{1.73_m2} — ABNORMAL LOW (ref >=60)
GLUCOSE RANDOM: 201 mg/dL — ABNORMAL HIGH (ref 70–179)
POTASSIUM: 4.3 mmol/L (ref 3.4–4.8)
SODIUM: 139 mmol/L (ref 135–145)

## 2022-05-12 LAB — CBC
HEMATOCRIT: 42.7 % (ref 39.0–48.0)
HEMOGLOBIN: 14.3 g/dL (ref 12.9–16.5)
MEAN CORPUSCULAR HEMOGLOBIN CONC: 33.5 g/dL (ref 32.0–36.0)
MEAN CORPUSCULAR HEMOGLOBIN: 28.8 pg (ref 25.9–32.4)
MEAN CORPUSCULAR VOLUME: 85.9 fL (ref 77.6–95.7)
MEAN PLATELET VOLUME: 9 fL (ref 6.8–10.7)
PLATELET COUNT: 107 10*9/L — ABNORMAL LOW (ref 150–450)
RED BLOOD CELL COUNT: 4.97 10*12/L (ref 4.26–5.60)
RED CELL DISTRIBUTION WIDTH: 14 % (ref 12.2–15.2)
WBC ADJUSTED: 7.9 10*9/L (ref 3.6–11.2)

## 2022-05-12 MED ORDER — NIFEDIPINE ER 30 MG TABLET,EXTENDED RELEASE 24 HR
ORAL_TABLET | Freq: Every day | ORAL | 0 refills | 30.00000 days | Status: CP
Start: 2022-05-12 — End: 2022-06-11
  Filled 2022-05-12: qty 30, 30d supply, fill #0

## 2022-05-12 MED ADMIN — apixaban (ELIQUIS) tablet 5 mg: 5 mg | ORAL | @ 14:00:00 | Stop: 2022-05-12

## 2022-05-12 MED ADMIN — pregabalin (LYRICA) capsule 100 mg: 100 mg | ORAL | @ 14:00:00 | Stop: 2022-05-12

## 2022-05-12 MED ADMIN — lidocaine 4 % patch 1 patch: 1 | TRANSDERMAL | @ 14:00:00 | Stop: 2022-05-12

## 2022-05-12 MED ADMIN — insulin glargine (LANTUS) injection 20 Units: 20 [IU] | SUBCUTANEOUS | @ 14:00:00 | Stop: 2022-05-12

## 2022-05-12 MED ADMIN — folic acid (FOLVITE) tablet 1 mg: 1 mg | ORAL | @ 14:00:00 | Stop: 2022-05-12

## 2022-05-12 MED ADMIN — insulin lispro (HumaLOG) injection 13 Units: 13 [IU] | SUBCUTANEOUS | @ 12:00:00 | Stop: 2022-05-12

## 2022-05-12 MED ADMIN — pantoprazole (Protonix) EC tablet 40 mg: 40 mg | ORAL | @ 14:00:00 | Stop: 2022-05-12

## 2022-05-12 MED ADMIN — polyethylene glycol (MIRALAX) packet 17 g: 17 g | ORAL | @ 14:00:00 | Stop: 2022-05-12

## 2022-05-12 MED ADMIN — NIFEdipine (PROCARDIA XL) 24 hr tablet 30 mg: 30 mg | ORAL | @ 14:00:00 | Stop: 2022-05-12

## 2022-05-12 NOTE — Unmapped (Signed)
Cardiology Discharge Summary    Identifying Information:  Colin Hunter  01/04/1971  [id number]     Admit Date: 05/05/2022  Discharge Date: 05/12/2022   Discharge To: Home  Discharge Service: Cardiology [facility])  Discharge Attending Physician: Dorcas Carrow, MD  Discharge Cardiology Fellow: Glyn Ade, MD  Primary Care Physician: [facility]     Discharge Diagnoses:  Principal Diagnosis:    Severe pulmonary valve insufficiency    Secondary Diagnoses:    Atrial fibrillation (CMS-HCC)    Chronic myeloid leukemia (CMS-HCC)    AKI on CKD    CKD (chronic kidney disease) stage 3, GFR 30-59 ml/min (CMS-HCC)    Congenital pulmonary valve stenosis    Essential hypertension    Gastroesophageal reflux disease    Heart failure with reduced ejection fraction (CMS-HCC)    Type 2 diabetes mellitus treated with insulin (CMS-HCC)    Outpatient Provider Follow Up Issues:  -- Follow-up labs that were ordered to be completed prior to Nephrology appointment on 2/6    Hospital Course:  Colin Hunter is a 52 y.o. male whose presentation is complicated by congential pulmonary valve stenosis s/p surgical valvotomy as a child c/b chronic pulmonary valve insufficiency and dilated RV/dysfunction, HFrEF (with EF >55% Nov 2023), atrial flutter, PFO, CKD Stage 3, CML, T2DM, HTN, HLD that presented to [facility] for transcatheter pulmonary valve replacement.      Severe pulmonary insufficiency s/p pulmonary valve replacement  - RV dilation and failure - HFrEF (EF >55% Nov 2023)  History of congenital pulmonary valve stenosis s/p surgical valvotomy as a child that has been complicated by chronic moderate to severe pulmonary valve insufficiency and dilated RV with reduced function. Patient is s/p uncomplicated placement of pulmonary valve replacement (25 mm Harmony) 1/25. Access was obtained through the R fem vein, L fem vein, and L fem artery and these sites were monitored without concern for bleeding or hematoma. He received three doses of Ancef for prophylaxis. He remained on bedrest overnight following the procedure. He was able to ambulate without difficulty the following morning and had the right groin suture removed prior to discharge. CXR and echocardiogram the morning after the procedure demonstrated that the valve was properly in place and demonstrated no significant pulmonary regurgitation. He was initially going to be discharged home with a ZioPatch, however remained in the hospital for an additional 7 days on telemetry after expected discharge, taking the place of the ZioPatch. Patient will follow-up with the Adult Congenital Valve team in one month.    AKI on CKD Stage 3 - Hyperkalemia - Metabolic Acidosis - Hyponatremia  Patient with history of known CKD Stage 3. Creatinine was mildly elevated from baseline on admission (3.43) with acute increase to 3.65 the following morning. Given concern for contrast-induced nephropathy he remained admitted for observation. Prior to 1/28, patient had minimal urine output over the previous days that was concerning for the need for acute dialysis. Cr peaked at 7.54 on 1/29 and has continued to downtrend since then. He had significant urine output with aggressive IV diuresis and then continued to auto-diurese after diuretics were held for several days. Patient was hyponatremic that was believed to be secondary to volume overload in the setting of having decreased urine output, and this improved with improvement of his urine output. Other abnormalities including hyperkalemia and metabolic acidosis that were likely due to the acute kidney injury were managed with Lokelma and sodium bicarbonate with appropriate improvements. He will have close follow-up with  nephrology that is tentatively scheduled for 2/6 at [facility].    Hypertension  Blood pressure not well-controlled while admitted and home lisinopril was held given AKI. Started nifedipine 30 mg daily with appropriate control of blood pressure. Will continue to hold lisinopril on discharge and have patient follow-up with his PCP for additional blood pressure management.     Procedures:  PR TCAT PULMONARY VALVE IMPLANTATION PRQ APPROACH [33477] (Peds Left/Right Heart Catheterization W Intervention)    Discharge Day Services:  BP 114/85  - Pulse 83  - Temp 36.5 ??C (97.7 ??F) (Oral)  - Resp 20  - Ht 188 cm (6' 2)  - Wt (!) 124.6 kg (274 lb 12.8 oz)  - SpO2 100%  - BMI 35.28 kg/m??   Pt seen on the day of discharge and determined appropriate for discharge.    Condition at Discharge: stable    Discharge Medications:     Your Medication List        STOP taking these medications      albuterol 90 mcg/actuation inhaler  Commonly known as: PROVENTIL HFA;VENTOLIN HFA  Replaced by: VENTOLIN HFA 90 mcg/actuation inhaler     HumuLIN R Regular U-100 Insuln 100 unit/mL injection  Generic drug: insulin regular     JARDIANCE 10 mg tablet  Generic drug: empagliflozin     lisinopril 5 MG tablet  Commonly known as: PRINIVIL,ZESTRIL     metOLazone 2.5 MG tablet  Commonly known as: ZAROXOLYN     metoPROLOL succinate 100 MG 24 hr tablet  Commonly known as: Toprol-XL     torsemide 40 mg Tab     traMADol 50 mg tablet  Commonly known as: ULTRAM            START taking these medications      NIFEdipine 30 MG 24 hr tablet  Commonly known as: PROCARDIA XL  Take 1 tablet (30 mg total) by mouth daily.     VENTOLIN HFA 90 mcg/actuation inhaler  Generic drug: albuterol  Inhale 2 puffs every six (6) hours as needed.  Replaces: albuterol 90 mcg/actuation inhaler            CONTINUE taking these medications      apixaban 5 mg Tab  Commonly known as: ELIQUIS  Take 1 tablet (5 mg total) by mouth Two (2) times a day.     BINAXNOW COVID-19 AG SELF TEST Kit  Generic drug: COVID-19 antigen test     blood pressure monitor Kit  1 each by Miscellaneous route in the morning.     blood sugar diagnostic Strp     ONETOUCH VERIO TEST STRIPS Strp  Generic drug: blood sugar diagnostic  USE 1 STRIP TO CHECK GLUCOSE TWICE DAILY     blood-glucose meter Misc     folic acid 1 MG tablet  Commonly known as: FOLVITE  Take 1 tablet (1 mg total) by mouth in the morning.     insulin glargine 100 unit/mL (3 mL) injection pen  Commonly known as: BASAGLAR, LANTUS  Inject 0.4 mL (40 Units total) under the skin two (2) times a day.     insulin lispro 100 unit/mL injection pen  Commonly known as: HumaLOG  INJECT 26 UNITS THREE TIMES DAILY BEFORE MEAL(S)     lancets Misc  by Miscellaneous route.     OZEMPIC 0.25 mg or 0.5 mg(2 mg/1.5 mL) Pnij injection  Generic drug: semaglutide  Inject 0.5 mg under the skin every seven (7) days.  pantoprazole 40 MG tablet  Commonly known as: Protonix  Take 1 tablet (40 mg total) by mouth daily.     pregabalin 100 MG capsule  Commonly known as: LYRICA  Take 1 capsule (100 mg total) by mouth Three (3) times a day.     rosuvastatin 20 MG tablet  Commonly known as: CRESTOR  Take 1 tablet (20 mg total) by mouth daily.     SCEMBLIX 40 mg tablet  Generic drug: asciminib  Take 1 tablet (40 mg total) by mouth Two (2) times a day. Take on an empty stomach, at least 1 hour before or 2 hours after a meal. Swallow tablets whole. Do not break, crush, or chew the tablets.              Recent Labs:  Microbiology Results (last day)       ** No results found for the last 24 hours. **          Lab Results   Component Value Date    WBC 7.9 05/12/2022    HGB 14.3 05/12/2022    HCT 42.7 05/12/2022    PLT 107 (L) 05/12/2022     Lab Results   Component Value Date    NA 139 05/12/2022    K 4.3 05/12/2022    CL 105 05/12/2022    CO2 26.0 05/12/2022    BUN 63 (H) 05/12/2022    CREATININE 4.37 (H) 05/12/2022    CALCIUM 9.4 05/12/2022    MG 2.2 05/10/2022    PHOS 6.0 (H) 05/10/2022     Lab Results   Component Value Date    ALKPHOS 248 (H) 05/05/2022    BILITOT 0.5 05/05/2022    BILIDIR <0.10 06/13/2020    PROT 5.9 05/05/2022    ALBUMIN 3.0 (L) 05/05/2022    ALT 54 (H) 05/05/2022    AST 34 05/05/2022     Lab Results   Component Value Date    PT 13.3 11/12/2020    INR 1.14 11/12/2020    APTT 83.3 (H) 12/04/2020     Radiology:  ECG 12 Lead    Result Date: 05/08/2022  NORMAL SINUS RHYTHM RIGHT BUNDLE BRANCH BLOCK POSSIBLE INFERIOR INFARCT (CITED ON OR BEFORE 26-Aug-2019) ABNORMAL ECG WHEN COMPARED WITH ECG OF 11-Feb-2022 13:06, NO SIGNIFICANT CHANGE WAS FOUND Confirmed by Christella Noa ([id number]) on 05/08/2022 1:10:28 PM    US Renal Complete    Result Date: 05/07/2022  EXAM: US RENAL COMPLETE ACCESSION: [id number] UN CLINICAL INDICATION: 52 years old with evaluate for obstruction  COMPARISON: None. TECHNIQUE: Static and cine images of the kidneys and bladder were performed. FINDINGS: KIDNEYS: Normal size with increased echogenicity, which is suggestive of renal parenchymal disease. Nonobstructing left-sided possible renal stone measure up to 1.0 cm. No hydronephrosis. Trace right perinephric fluid.      Right kidney:  12.3   cm      Left kidney:  12.0   cm BLADDER: Bladder is underdistended with mild bladder wall thickening, which is nonspecific and could be secondary to under distention.      Bladder volume prevoid: 31.1 mL      Bladder volume postvoid: mL Slightly prominent prostate gland.     -No hydronephrosis.     Echocardiogram Follow Up/Limited Echo    Result Date: 05/06/2022  Patient Info Name:     Colin Hunter Age:     51 years DOB:     1971-03-19 Gender:     Male MRN:  [id number] Accession #:     [id number] UN Account #:     [id number] Ht:     188 cm Wt:     128 kg BSA:     2.62 m2 Exam Date:     05/06/2022 8:15 AM Admit Date:     05/05/2022 Exam Type:     ECHOCARDIOGRAM FOLLOW UP/LIMITED ECHO Technical Quality:     Fair Staff Sonographer:     Bubba Hales Referring Physician:     Charlann Noss MD Study Info Indications      - post pulmonary valve replacement Procedure(s)   Limited 2D, color flow and Doppler transthoracic echocardiogram is performed. Summary   1. Limited study to assess newly placed pulmonic valve replacement.   2. The 25 mm Medtronic Harmony pulmonic valve (placed 05/05/22) appears structurally and functionally normal by two-dimensional, color flow Doppler and Doppler interrogation.   3. There is no significant pulmonic regurgitation.   4. The right ventricle is moderately dilated in size, with mildly reduced systolic function.   5. There is moderate tricuspid regurgitation. Left Ventricle   The left ventricle is normal in size with moderately increased wall thickness. The left ventricular systolic function is normal, LVEF is visually estimated at 55-60%. Left ventricular diastolic function cannot be accurately assessed. Right Ventricle   The right ventricle is moderately dilated in size, with mildly reduced systolic function. Ventricular Septum   Abnormal ventricular septal motion consistent with RV volume overload (diastolic flattening). Aortic Valve   The aortic valve is trileaflet with normal appearing leaflets with normal excursion. Mitral Valve   The mitral valve leaflets are normal with normal leaflet mobility. There is no significant mitral valve regurgitation. Tricuspid Valve   The tricuspid valve leaflets are normal, with normal leaflet mobility. There is moderate tricuspid regurgitation. TR maximum velocity: 3.1 m/s  Estimated RVSP: 41 mmHg. Pulmonic Valve   The 25 mm Medtronic Harmony pulmonic valve (placed 05/05/22) appears structurally and functionally normal by two-dimensional, color flow Doppler and Doppler interrogation. There is no significant pulmonic regurgitation. There is no evidence of a significant transvalvular gradient. Mean gradient across the Medtronic Harmony pulmonary valve is 9 mmHg. Peak gradient 15 mm Hg. Peak velocity 1.9 m/sec. Inferior Vena Cava   IVC size and inspiratory change suggest low right atrial pressure. (<3 mmHg). Pericardium/Pleural   There is no pericardial effusion. Other Findings   Rhythm: Sinus Rhythm. Ventricles ---------------------------------------------------------------------- Name                                 Value        Normal ---------------------------------------------------------------------- LV Dimensions 2D/MM ----------------------------------------------------------------------  IVS Diastolic Thickness (2D)                                1.3 cm       0.6-1.0 LVID Diastole (2D)                  4.0 cm       4.2-5.8  LVPW Diastolic Thickness (2D)                                1.3 cm       0.6-1.0 LVID Systole (2D)  2.4 cm       2.5-4.0 Mitral Valve ---------------------------------------------------------------------- Name                                 Value        Normal ---------------------------------------------------------------------- MV Diastolic Function ---------------------------------------------------------------------- MV E Peak Velocity                101 cm/s               MV A Peak Velocity                 83 cm/s               MV E/A                                 1.2               MV Annular TDI ---------------------------------------------------------------------- MV Septal e' Velocity             5.5 cm/s         >=8.0 MV E/e' (Septal)                      18.3               MV Lateral e' Velocity            5.4 cm/s        >=10.0 MV E/e' (Lateral)                     18.6               MV e' Average                     5.5 cm/s               MV E/e' (Average)                     18.5 Tricuspid Valve ---------------------------------------------------------------------- Name                                 Value        Normal ---------------------------------------------------------------------- TV Regurgitation Doppler ---------------------------------------------------------------------- TR Peak Velocity                   3.1 m/s               Estimated PAP/RSVP ---------------------------------------------------------------------- RA Pressure 3 mmHg           <=5 RV Systolic Pressure               41 mmHg           <36 Pulmonic Valve ---------------------------------------------------------------------- Name                                 Value        Normal ---------------------------------------------------------------------- PV Doppler ---------------------------------------------------------------------- PV Peak Velocity                   1.9 m/s Report Signatures Finalized by Hart Carwin  MD on 05/06/2022 12:47 PM Resident Allene Dillon  Roy-Chaudhury on  05/06/2022 09:45 AM    XR Chest 2 views    Result Date: 05/06/2022  EXAM: XR CHEST 2 VIEWS ACCESSION: [id number] UN CLINICAL INDICATION: Postsurgical status. TECHNIQUE: PA and Lateral Chest Radiographs. COMPARISON: November 24, 2020. FINDINGS: Transcatheter pulmonic valve replacement. The lungs are clear with no pleural fluid or pneumothorax. Cardiomediastinal contour unremarkable.     No acute findings post pulmonic valve replacement.    Peds TPVR    Result Date: 05/05/2022  Images from the original result were not included. PEDIATRIC CARDIAC CATHETERIZATION REPORT  Procedure Date:  05/05/22 Referring MD:   [name], MD Pre-Catheterization Diagnoses: History of surgical pulmonary valvotomy Severe pulmonary valve insufficiency Progressive, severe right ventricular dilation Procedures: Right heart catheterization normal connections (98119), Transcatheter pulmonary valve placement (14782) Summary of Findings: Severely regurgitant pulmonary valve and severe right ventricular dilation now s/p transcatheter pulmonary valve placement (Medtronic Harmony TPV25) Normal cardiac output Mild RV diastolic dysfunction Upper normal mean pulmonary artery pressure and PVR Mild LV diastolic dysfunction Complications:  None Plan: The patient will be admitted overnight and remain on bedrest. Echocardiogram, EKG and 2-view CXR will be completed and reviewed prior to discharge. Colin Hunter will resume Eliquis the day after the procedure. He requires lifelong SBE prophylaxis. A 7 day Ziopatch will be placed upon discharge. He will follow up with an [facility] cardiologist in 3-4 weeks. Clinical Summary: Colin Hunter is a 52 y.o. male with a history of surgical pulmonary valvotomy in childhood who has developed severe pulmonary valve insufficiency and progressive right ventricular dilation. He is referred for transcatheter pulmonary valve placement.   Procedure Summary: (See procedure log for time-annotated documentation) The patient was brought to the catheterization laboratory. He received general anesthesia and was maintained in 21% FiO2 for hemodynamic measurements. He was prepped and draped in the standard sterile fashion. The patient was re-identified and procedural time-out completed. Vascular access was obtained using the modified Seldinger technique with ultrasound guidance. A 7 Fr sheath was placed in both the right and left femoral veins and a 5 Fr sheath in the left femoral artery. The patient was systemically heparinized. A standard antegrade right heart catheterization was performed. A 7 Fr balloon wedge catheter was advanced to the SVC, RA, RV and Right pulmonary artery to measure oximetry and pressures, including a pulmonary capillary wedge pressure.  Angiography and interventions were performed as described below. The patient tolerated the procedure well without complications. Lines were removed and hemostasis achieved with the application of light pressure. The patient was transported to post-catheterization recovery area for post-catheterization monitoring and recovery. The findings were discussed with the patient's family and treating physicians. Hemodynamics: By oximetry, cardiac index was normal. There were no intracardiac shunts detected. Right heart filling pressure was mildly elevated. There was no obstruction from the right ventricle to the branch pulmonary arteries. Mean pulmonary artery pressures were mildly elevated, as was the wedge pressure. PVR was normal. Refer also to PedCath pictorial diagram below and time-annotated procedure log (archived elsewhere in EPIC) Angiography: An initial pulmonary arteriogram was performed to profile the RVOT and identify landmarks to guide subsequent valve implantation. Severe pulmonary regurgitation was noted. Intervention(s): Transcatheter pulmonary valve placement Indication: Severe pulmonary valve insufficiency and right ventricular dilation Details: The wedge catheter was advanced through the 7 Fr venous sheath to the right pulmonary artery. The Lunderquist double curve wire was advanced through the wedge catheter to the distal right pulmonary artery and secure wire placement was obtained prior to removal of the wedge catheter. The 7 Fr sheath  was exchanged for the 26 Fr [facility] sheath which was advanced to the RVOT. The TPV25 Harmony valve was loaded on a 25 Fr delivery system and advanced over the Lunderquist wire through the 26Fr sheath to the RPA. The valve was carefully positioned, unsheathed and deployed in the RVOT with the guidance of multiple sighting angiograms and brief cineangiography bursts. Results: Successful transcatheter placement of a 25 mm Medtronic Harmony valve in the right ventricular outflow tract. The valve is well seated without residual regurgitation, stenosis or paravalvar leak.  Roosvelt Harps, DO MSCR Pediatric and Adult Congenital Interventional Cardiology [facility] [phone] (office phone) or [phone] (pager)        Echocardiogram Pediatric Congenital Complete W Color Spect Dopp    Result Date: 05/05/2022                [facility]      Division of Pediatric Cardiology  [address] [place], [place] [address]               [phone]  NAME:       Colin Hunter      DOB: July 11, 1970  Ht: 188.0 cm PT ID#:     [id number]         Age: 52 years   Wt: 121.0 kg STUDY DATE: 05/05/2022 9:01:00 AM Sex: M         BSA: 2.46 m??                                                  BP: 145/90 mmHg Referring Physician: [id number] Charlann Noss Sonographer:         [id number], Earlene Plater, MADELINE  CPT Codes:        [phone] Complete congenital echocardiogram, 93320 Spectal                   Doppler and 93325 Color flow Doppler  Indications:      Surgery to heart and great vessel-V15.1 Diagnosis:        Surgery to heart and great vessel-V15.1 Study Information The images were of adequate diagnostic quality. Study Location:   [facility]   Summary:  1. H/o pulmonary valve stenosis s/p surgical valvotomy 2011.     Now s/p 25mm Harmony transcatheter pulmonary valve.  2. Mild to moderate tricuspid valve regurgitation.  3. Peak gradient through the stented RVOT is .  4. Moderately dilated right ventricle.  5. Mildly diminished right ventricular systolic function.  6. Normal size left ventricle.  7. Mildly diminished left ventricular systolic function.  8. No pericardial effusion. M-mode: RVIDd:               4.78  cm IVSd:                1.09  cm LVIDd:               4.19  cm LVIDs:               3.21  cm LVPWd:               0.91  cm LV mass (ASE corr.):  137  g LV mass index (ASE): 55.7  g Systolic Function LV SF (M-mode):   23  % LV EF (M-mode):   47  %  Tricuspid Valve Doppler Regurg peak velocity:     2.12  m/s RV Systolic Pressure (TR) 17.9  mmHg  Estimated Pressures RV systolic pressure (TR): 17.94  mmHg  Segmental Cardiotype, Cardiac Position, and Situs: Cardiac segments(S,D,S). The heart position is within the left hemithorax. The cardiac apex is oriented leftward. There is visceral situs solitus. Systemic Veins: The superior vena cava is right-sided and drains normally to the right atrium. The inferior vena cava is right-sided and inserts into the right atrium normally. Pulmonary Veins: There is no evidence of anomalous pulmonary venous connection. Atria: The atrial septum is intact, with no evidence of interatrial shunt. There is no evidence of a patent foramen ovale. The right atrium is normal in size. The left atrium is normal in size. Tricuspid Valve: The tricuspid valve is normal. Tricuspid inflow is laminar, with normal Doppler velocity pattern. There is mild to moderate tricuspid valve regurgitation. Mitral Valve: The mitral valve is normal. Mitral inflow is laminar, with normal Doppler velocity pattern. The papillary muscle configuration appears normal. There is no evidence of mitral valve insufficiency. Left Ventricle: The left ventricle is normal in size. Left ventricular systolic function is qualitatively mildly diminished. Right Ventricle: The right ventricle is moderately dilated. Right ventricular systolic function is mildly diminished. VSD: There is no evidence of a ventricular septal defect. Left Ventricular Outflow Tract and Aortic Valve: There is no evidence of left ventricular outflow obstruction. The aortic valve is trileaflet. Transaortic flow is laminar, with normal Doppler velocity pattern. Right Ventricular Outflow Tract and Pulmonary Valve: There is no evidence of right ventricular outflow obstruction. Peak gradient through the stented RVOT is . The pulmonary valve is normal. Transpulmonary flow is laminar, with normal Doppler velocity pattern. H/o pulmonary valve stenosis s/p surgical valvotomy 2011. Now s/p 25mm Harmony transcatheter pulmonary valve. Aorta: The (aortic) sinuses of Valsalva segment is normal. The ascending aorta is normal. There is no determination of aortic arch side. The flow pattern in the aorta is normal. Pulmonary Arteries: The main pulmonary artery is normal. The left branch pulmonary artery was not well vizualized. The right branch pulmonary artery was not well visualized. Ductus Arteriosus: The ductus arteriosus was not evaluated. Coronary Arteries: The coronary arteries were not evaluated. Pericardium: There is no evidence of pericardial effusion.  [id number] Dwaine Gale MD *Electronically signed on 05/05/2022 at 12:35:35 PM  cc:   ** Final **     Discharge Instructions:  Activity Instructions       Activity as tolerated              Other Instructions       Call MD for:  difficulty breathing, headache or visual disturbances      Call MD for:  persistent nausea or vomiting      Call MD for:  severe uncontrolled pain      Call MD for:  temperature >38.5 Celsius      Discharge instructions      You were admitted to [facility] for severe pulmonary valve insufficiency and had a pulmonary valve replacement performed 1/25. During this procedure they give a medicine called contrast to be able to visualize everything in the heart. This medication can cause kidney injury, so it is important that you drink extra fluids over these next few days.    You are now ready to discharge and will be discharging to: Home    Please take your medications as prescribed. Medication changes are listed below and a full list of medications  is in this discharge packet. Please keep your follow-up appointments after the hospital for ongoing care. It has been a pleasure taking care of you, we wish you the best.     MEDICATION CHANGES:  -- STOP taking lisinopril until you are able to follow-up with your Nephrologist and discuss further.  -- STOP taking Jardiance until you are able to follow-up with your Nephrologist and discuss further.  -- START taking nifedipine which is a medication that will help with your blood pressure.    FOLLOW-UP:  -- It is important to see your primary care provider (PCP) after being in the hospital. [facility] PCP [facility] and the phone number of their office is [phone]. Please call your PCP's office as soon as possible to schedule an appointment with them.   -- Follow-up with the Adult Congenital Valve specialists will be scheduled for you and will take place in one month.  -- You have a follow up with your kidney doctor on 2/6. It is very important that you keep this appointment as they will be able to monitor your kidney function following this procedure.  -- You should also have labs checked prior to follow up on your kidney function. The closest [facility] lab is [facility] located at [address], [place], [place] [address]    Activity:    Slowly resume your regular activities    Diet:    Resume your regular diet. If your stomach is upset, try bland, low-fat foods like plain rice, broiled chicken, toast, and yogurt.  -- Increase your water intake until your urinating returns to normal.    Care of the catheter site:   You may have a bruise or a small lump where the catheter was put in (the catheter site). The area may feel sore for a day or two after the procedure.   You can shower after the procedure. Avoid soaking the catheter site in water until the area is healed. This includes staying out of bathtubs and swimming pools for about 7 days,  until the catheter sites are completely healed   Watch for bleeding from the site. A small amount of blood on the bandage can be normal.   If you notice bleeding, child lie down and have someone help press on the area for 15 minutes to try to make it stop. If the bleeding does not stop, call doctor or seek immediate medical care.    SBE prophylaxis:   You will require antibiotics before any dental procedures going forward            Follow Up instructions and Outpatient Referrals     Basic metabolic panel      Is this a fasting order?: No    Release to patient: Immediate     BMP contains the following laboratory tests: NA, K, CL, CO2, BUN, CR,   GLUC, and CA.     CBC      Release to patient: Immediate    Call MD for:  difficulty breathing, headache or visual disturbances      Call MD for:  persistent nausea or vomiting      Call MD for:  severe uncontrolled pain      Call MD for:  temperature >38.5 Celsius      Discharge instructions        Appointments which have been scheduled for you      May 17, 2022  1:30 PM  (Arrive by 1:15 PM)  NEW  GENERAL with Dudley Major, MD  [facility] NEPHROLOGY [facility] REGION) [address]  [place] [address]  [phone]        Jun 06, 2022 10:30 AM  (Arrive by 10:00 AM)  RETURN  30 with Charlann Noss, DO  [facility]ILDRENS CARDIOLOGY [facility] REGION) [facility]  [address] [place] [address]  [place] [address]  [phone]      Additional instructions:    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   If you have questions about this appointment or need to change the appointment call Irving Burton RN at [phone].            Length of Discharge: I spent less than 30 mins in the discharge of this patient.

## 2022-05-12 NOTE — Unmapped (Signed)
Problem: Adult Inpatient Plan of Care  Goal: Plan of Care Review  Outcome: Progressing  Goal: Patient-Specific Goal (Individualized)  Outcome: Progressing  Goal: Absence of Hospital-Acquired Illness or Injury  Outcome: Progressing  Intervention: Identify and Manage Fall Risk  Recent Flowsheet Documentation  Taken 05/12/2022 0800 by Lennox Grumbles, RN  Safety Interventions:   lighting adjusted for tasks/safety   low bed   nonskid shoes/slippers when out of bed  Intervention: Prevent Infection  Recent Flowsheet Documentation  Taken 05/12/2022 0800 by Lennox Grumbles, RN  Infection Prevention:   environmental surveillance performed   hand hygiene promoted  Goal: Optimal Comfort and Wellbeing  Outcome: Progressing  Goal: Readiness for Transition of Care  Outcome: Progressing  Goal: Rounds/Family Conference  Outcome: Progressing

## 2022-05-12 NOTE — Unmapped (Signed)
[facility] Available   Tax[address]      Address   [address]  [place] [address]             [place]    [phone]

## 2022-05-12 NOTE — Unmapped (Signed)
VSS, A&OX4, NSR, RA. No c/o pain. I/Os monitored. Blood sugars check per order. Independent and ambulatory. Voids using urinal. Falls precautions in place. Bed in lowest position, call bell, and personal items within reach.   Problem: Adult Inpatient Plan of Care  Goal: Plan of Care Review  Outcome: Progressing  Goal: Patient-Specific Goal (Individualized)  Outcome: Progressing  Goal: Absence of Hospital-Acquired Illness or Injury  Outcome: Progressing  Intervention: Identify and Manage Fall Risk  Recent Flowsheet Documentation  Taken 05/12/2022 0400 by Carmin Muskrat, RN  Safety Interventions:   lighting adjusted for tasks/safety   low bed  Taken 05/12/2022 0200 by Carmin Muskrat, RN  Safety Interventions:   low bed   lighting adjusted for tasks/safety  Taken 05/12/2022 0000 by Carmin Muskrat, RN  Safety Interventions:   low bed   lighting adjusted for tasks/safety  Taken 05/11/2022 2200 by Carmin Muskrat, RN  Safety Interventions:   low bed   lighting adjusted for tasks/safety  Taken 05/11/2022 2000 by Carmin Muskrat, RN  Safety Interventions:   low bed   lighting adjusted for tasks/safety  Intervention: Prevent Skin Injury  Recent Flowsheet Documentation  Taken 05/12/2022 0400 by Carmin Muskrat, RN  Positioning for Skin: Right  Taken 05/12/2022 0200 by Carmin Muskrat, RN  Positioning for Skin: Left  Taken 05/12/2022 0000 by Carmin Muskrat, RN  Positioning for Skin: Supine/Back  Taken 05/11/2022 2200 by Carmin Muskrat, RN  Positioning for Skin: Right  Taken 05/11/2022 2000 by Carmin Muskrat, RN  Positioning for Skin: Supine/Back  Goal: Optimal Comfort and Wellbeing  Outcome: Progressing  Goal: Readiness for Transition of Care  Outcome: Progressing  Goal: Rounds/Family Conference  Outcome: Progressing     Problem: Skin Injury Risk Increased  Goal: Skin Health and Integrity  Outcome: Progressing  Intervention: Optimize Skin Protection  Recent Flowsheet Documentation  Taken 05/12/2022 0400 by Carmin Muskrat, RN  Pressure Reduction Techniques: frequent weight shift encouraged  Pressure Reduction Devices: pressure-redistributing mattress utilized  Taken 05/12/2022 0200 by Carmin Muskrat, RN  Pressure Reduction Techniques: frequent weight shift encouraged  Pressure Reduction Devices: pressure-redistributing mattress utilized  Taken 05/12/2022 0000 by Carmin Muskrat, RN  Pressure Reduction Techniques: frequent weight shift encouraged  Pressure Reduction Devices: pressure-redistributing mattress utilized  Taken 05/11/2022 2200 by Carmin Muskrat, RN  Pressure Reduction Techniques: frequent weight shift encouraged  Pressure Reduction Devices: pressure-redistributing mattress utilized  Taken 05/11/2022 2000 by Carmin Muskrat, RN  Pressure Reduction Techniques: frequent weight shift encouraged  Pressure Reduction Devices: pressure-redistributing mattress utilized     Problem: Fall Injury Risk  Goal: Absence of Fall and Fall-Related Injury  Outcome: Progressing  Intervention: Promote Scientist, clinical (histocompatibility and immunogenetics) Documentation  Taken 05/12/2022 0400 by Carmin Muskrat, RN  Safety Interventions:   lighting adjusted for tasks/safety   low bed  Taken 05/12/2022 0200 by Carmin Muskrat, RN  Safety Interventions:   low bed   lighting adjusted for tasks/safety  Taken 05/12/2022 0000 by Carmin Muskrat, RN  Safety Interventions:   low bed   lighting adjusted for tasks/safety  Taken 05/11/2022 2200 by Carmin Muskrat, RN  Safety Interventions:   low bed   lighting adjusted for tasks/safety  Taken 05/11/2022 2000 by Carmin Muskrat, RN  Safety Interventions:   low bed   lighting adjusted for tasks/safety     Problem: Wound  Goal: Optimal Coping  Outcome: Progressing  Goal: Optimal Functional Ability  Outcome: Progressing  Goal: Absence of Infection Signs and Symptoms  Outcome: Progressing  Goal: Improved Oral Intake  Outcome: Progressing  Goal: Optimal Pain Control and Function  Outcome: Progressing  Goal: Skin Health and Integrity  Outcome: Progressing  Intervention: Optimize Skin Protection  Recent Flowsheet Documentation  Taken 05/12/2022 0400 by Carmin Muskrat, RN  Pressure Reduction Techniques: frequent weight shift encouraged  Pressure Reduction Devices: pressure-redistributing mattress utilized  Taken 05/12/2022 0200 by Carmin Muskrat, RN  Pressure Reduction Techniques: frequent weight shift encouraged  Pressure Reduction Devices: pressure-redistributing mattress utilized  Taken 05/12/2022 0000 by Carmin Muskrat, RN  Pressure Reduction Techniques: frequent weight shift encouraged  Pressure Reduction Devices: pressure-redistributing mattress utilized  Taken 05/11/2022 2200 by [employer], Pal Shell, RN  Pressure Reduction Techniques: frequent weight shift encouraged  Pressure Reduction Devices: pressure-redistributing mattress utilized  Taken 05/11/2022 2000 by [employer], Grace Haggart, RN  Pressure Reduction Techniques: frequent weight shift encouraged  Pressure Reduction Devices: pressure-redistributing mattress utilized  Goal: Optimal Wound Healing  Outcome: Progressing     Problem: Comorbidity Management  Goal: Blood Glucose Levels Within Targeted Range  Outcome: Progressing  Goal: Maintenance of Heart Failure Symptom Control  Outcome: Progressing  Goal: Blood Pressure in Desired Range  Outcome: Progressing     Problem: Heart Failure  Goal: Optimal Coping  Outcome: Progressing  Goal: Optimal Cardiac Output  Outcome: Progressing  Goal: Stable Heart Rate and Rhythm  Outcome: Progressing  Goal: Optimal Functional Ability  Outcome: Progressing  Goal: Fluid and Electrolyte Balance  Outcome: Progressing  Goal: Improved Oral Intake  Outcome: Progressing  Goal: Effective Oxygenation and Ventilation  Outcome: Progressing  Goal: Effective Breathing Pattern During Sleep  Outcome: Progressing

## 2022-05-12 NOTE — Unmapped (Signed)
Pt able to independently ambulate and complete ADLs today with no report of shortness of breath or fatigue. Vital signs stable and pt denies pain throughout shift. Discharge delayed until tomorrow - nephrology wants to monitor pt further. Needs regularly assessed, pt resting in bed, call bell within reach.     Problem: Adult Inpatient Plan of Care  Goal: Plan of Care Review  Outcome: Progressing  Goal: Patient-Specific Goal (Individualized)  Outcome: Progressing  Goal: Absence of Hospital-Acquired Illness or Injury  Outcome: Progressing  Intervention: Identify and Manage Fall Risk  Recent Flowsheet Documentation  Taken 05/11/2022 0800 by Theresia Bough, RN  Safety Interventions:   fall reduction program maintained   lighting adjusted for tasks/safety   nonskid shoes/slippers when out of bed   low bed  Intervention: Prevent Skin Injury  Recent Flowsheet Documentation  Taken 05/11/2022 0800 by Theresia Bough, RN  Positioning for Skin: Supine/Back  Skin Protection: adhesive use limited  Goal: Optimal Comfort and Wellbeing  Outcome: Progressing  Goal: Readiness for Transition of Care  Outcome: Progressing  Goal: Rounds/Family Conference  Outcome: Progressing     Problem: Skin Injury Risk Increased  Goal: Skin Health and Integrity  Outcome: Progressing  Intervention: Optimize Skin Protection  Recent Flowsheet Documentation  Taken 05/11/2022 0800 by Theresia Bough, RN  Activity Management: sitting, edge of bed  Pressure Reduction Techniques: frequent weight shift encouraged  Pressure Reduction Devices: pressure-redistributing mattress utilized  Skin Protection: adhesive use limited     Problem: Fall Injury Risk  Goal: Absence of Fall and Fall-Related Injury  Outcome: Progressing  Intervention: Promote Scientist, clinical (histocompatibility and immunogenetics) Documentation  Taken 05/11/2022 0800 by Theresia Bough, RN  Safety Interventions:   fall reduction program maintained   lighting adjusted for tasks/safety   nonskid shoes/slippers when out of bed   low bed     Problem: Wound  Goal: Optimal Coping  Outcome: Progressing  Goal: Optimal Functional Ability  Outcome: Progressing  Intervention: Optimize Functional Ability  Recent Flowsheet Documentation  Taken 05/11/2022 0800 by Theresia Bough, RN  Activity Management: sitting, edge of bed  Goal: Absence of Infection Signs and Symptoms  Outcome: Progressing  Goal: Improved Oral Intake  Outcome: Progressing  Goal: Optimal Pain Control and Function  Outcome: Progressing  Goal: Skin Health and Integrity  Outcome: Progressing  Intervention: Optimize Skin Protection  Recent Flowsheet Documentation  Taken 05/11/2022 0800 by Theresia Bough, RN  Activity Management: sitting, edge of bed  Pressure Reduction Techniques: frequent weight shift encouraged  Pressure Reduction Devices: pressure-redistributing mattress utilized  Skin Protection: adhesive use limited  Goal: Optimal Wound Healing  Outcome: Progressing     Problem: Comorbidity Management  Goal: Blood Glucose Levels Within Targeted Range  Outcome: Progressing  Intervention: Monitor and Manage Glycemia  Recent Flowsheet Documentation  Taken 05/11/2022 0800 by Theresia Bough, RN  Glycemic Management: blood glucose monitored  Goal: Maintenance of Heart Failure Symptom Control  Outcome: Progressing  Goal: Blood Pressure in Desired Range  Outcome: Progressing     Problem: Heart Failure  Goal: Optimal Coping  Outcome: Progressing  Goal: Optimal Cardiac Output  Outcome: Progressing  Goal: Stable Heart Rate and Rhythm  Outcome: Progressing  Goal: Optimal Functional Ability  Outcome: Progressing  Intervention: Optimize Functional Ability  Recent Flowsheet Documentation  Taken 05/11/2022 0800 by Theresia Bough, RN  Activity Management: sitting, edge of bed  Goal: Fluid and Electrolyte Balance  Outcome: Progressing  Goal: Improved Oral Intake  Outcome:  Progressing  Goal: Effective Oxygenation and Ventilation  Outcome: Progressing  Intervention: Promote Airway Secretion Clearance  Recent Flowsheet Documentation  Taken 05/11/2022 0800 by Theresia Bough, RN  Activity Management: sitting, edge of bed  Goal: Effective Breathing Pattern During Sleep  Outcome: Progressing

## 2022-05-17 ENCOUNTER — Other Ambulatory Visit: Admit: 2022-05-17 | Discharge: 2022-05-17 | Payer: MEDICARE

## 2022-05-17 ENCOUNTER — Ambulatory Visit: Admit: 2022-05-17 | Discharge: 2022-05-17 | Payer: MEDICARE

## 2022-05-17 DIAGNOSIS — N1832 Stage 3b chronic kidney disease (CMS-HCC): Principal | ICD-10-CM

## 2022-05-17 LAB — RENAL FUNCTION PANEL
ALBUMIN: 3.1 g/dL — ABNORMAL LOW (ref 3.4–5.0)
ANION GAP: 8 mmol/L (ref 5–14)
BLOOD UREA NITROGEN: 50 mg/dL — ABNORMAL HIGH (ref 9–23)
BUN / CREAT RATIO: 19
CALCIUM: 9.8 mg/dL (ref 8.7–10.4)
CHLORIDE: 109 mmol/L — ABNORMAL HIGH (ref 98–107)
CO2: 23 mmol/L (ref 20.0–31.0)
CREATININE: 2.65 mg/dL — ABNORMAL HIGH
EGFR CKD-EPI (2021) MALE: 28 mL/min/{1.73_m2} — ABNORMAL LOW (ref >=60)
GLUCOSE RANDOM: 74 mg/dL (ref 70–179)
PHOSPHORUS: 3.1 mg/dL (ref 2.4–5.1)
POTASSIUM: 4.5 mmol/L (ref 3.4–4.8)
SODIUM: 140 mmol/L (ref 135–145)

## 2022-05-17 NOTE — Unmapped (Signed)
Labs today    Keep good control of your blood pressure and blood sugars    Your target HbA1C is less then 7    Continue to stay well hydrated try stick 2L of water or 64 oz    We will discuss starting lisinopril and Jardiance on the next visit depending on today's labs

## 2022-05-17 NOTE — Unmapped (Signed)
Referring Provider: Noah Charon, MD     PCP: [facility]    05/17/2022    Chief Complaint: AKI on CKD4    HPI:  [name] is a 52 y.o. male with a past medical history of CKD4 DM HTN PVi s/p Replacement who is was seen in consultation at the request of Bongu, Nikki Dom, MD for evaluation of hospital follow up after AKI on CKD3.       Patient presenting to clinic after having his pulmonary valve replaced in the hospital. Patient developed a severe AKI but has since improved not requiring HD in the hospital Patient with a baseline Cr of 2.8-3 with a rise to ~7 but down to 4.3 at discharge. Patient has been doing well since discharge without complaints. Staying well hydrated. Patietn states that his blood sugars have been in the 200s and blood pressure has been pretty well controlled in the 130s systolic. Patietn denies any NSAID use. Since DC     He denies a family history of kidney disease and denies a personal history of frequent urinary tract infections, kidney stones or excessive NSAID use.      Denies fevers, chills, headaches, chest pain, shortness of breath, nausea, vomiting, abdominal pain, diarrhea, dysuria, hematuria, ulcers, arthralgias, syncope or seizures.     ROS:  11 systems reviewed and negative except those noted in the history of present illness    PAST MEDICAL HISTORY:  Past Medical History:   Diagnosis Date    CKD (chronic kidney disease) stage 3, GFR 30-59 ml/min (CMS-HCC)     CML (chronic myelocytic leukemia) (CMS-HCC)     HFrEF (heart failure with reduced ejection fraction) (CMS-HCC)     Paroxysmal atrial fibrillation (CMS-HCC)     PFO (patent foramen ovale)     Right atrial thrombus     T2DM (type 2 diabetes mellitus) (CMS-HCC)        ALLERGIES  Patient has no known allergies.    SOCIAL HISTORY  Social History     Socioeconomic History    Marital status: Married   Tobacco Use    Smoking status: Former     Current packs/day: 0.00     Average packs/day: 0.5 packs/day for 6.0 years (3.0 ttl pk-yrs)     Types: Cigarettes     Start date: 09/10/2012     Quit date: 09/11/2018     Years since quitting: 3.6    Smokeless tobacco: Never   Substance and Sexual Activity    Alcohol use: Never    Drug use: Never    Sexual activity: Not Currently     Social Determinants of Health     Financial Resource Strain: Low Risk  (05/06/2022)    Overall Financial Resource Strain (CARDIA)     Difficulty of Paying Living Expenses: Not hard at all   Food Insecurity: No Food Insecurity (05/06/2022)    Hunger Vital Sign     Worried About Running Out of Food in the Last Year: Never true     Ran Out of Food in the Last Year: Never true   Transportation Needs: No Transportation Needs (05/06/2022)    PRAPARE - Therapist, art (Medical): No     Lack of Transportation (Non-Medical): No         FAMILY HISTORY  Family History   Problem Relation Age of Onset    Heart disease Father  MEDICATIONS:  Current Outpatient Medications   Medication Sig Dispense Refill    apixaban (ELIQUIS) 5 mg Tab Take 1 tablet (5 mg total) by mouth Two (2) times a day. 60 tablet 12    asciminib (SCEMBLIX) 40 mg tablet Take 1 tablet (40 mg total) by mouth Two (2) times a day. Take on an empty stomach, at least 1 hour before or 2 hours after a meal. Swallow tablets whole. Do not break, crush, or chew the tablets. 60 tablet 11    BINAXNOW COVID-19 AG SELF TEST Kit       blood sugar diagnostic Strp       blood-glucose meter Misc       folic acid (FOLVITE) 1 MG tablet Take 1 tablet (1 mg total) by mouth in the morning. 90 tablet 3    insulin glargine (BASAGLAR, LANTUS) 100 unit/mL (3 mL) injection pen Inject 0.4 mL (40 Units total) under the skin two (2) times a day.  0    insulin lispro (HUMALOG) 100 unit/mL injection pen INJECT 26 UNITS THREE TIMES DAILY BEFORE MEAL(S)      lancets Misc by Miscellaneous route.      methocarbamol (ROBAXIN) 500 MG tablet Take 1 tablet (500 mg total) by mouth in the morning. metoPROLOL succinate (TOPROL-XL) 50 MG 24 hr tablet Take 1 tablet (50 mg total) by mouth daily.      NIFEdipine (PROCARDIA XL) 30 MG 24 hr tablet Take 1 tablet (30 mg total) by mouth daily. 30 tablet 0    ONETOUCH VERIO TEST STRIPS Strp USE 1 STRIP TO CHECK GLUCOSE TWICE DAILY      pantoprazole (PROTONIX) 40 MG tablet Take 1 tablet (40 mg total) by mouth daily.      pregabalin (LYRICA) 100 MG capsule Take 1 capsule (100 mg total) by mouth Three (3) times a day.      rosuvastatin (CRESTOR) 20 MG tablet Take 1 tablet (20 mg total) by mouth daily. 90 tablet 0    varenicline (CHANTIX PAK) 0.5 mg (11)- 1 mg (42) tablet Take 1 Package by mouth two (2) times a day.      VENTOLIN HFA 90 mcg/actuation inhaler Inhale 2 puffs every six (6) hours as needed. 18 g 0    blood pressure monitor Kit 1 each by Miscellaneous route in the morning. (Patient not taking: Reported on 05/17/2022) 1 kit 0    OZEMPIC 0.25 mg or 0.5 mg(2 mg/1.5 mL) PnIj injection Inject 0.5 mg under the skin every seven (7) days.       No current facility-administered medications for this visit.       PHYSICAL EXAM:     Vitals:    05/17/22 1315   BP: 130/82   Pulse: 77   Resp: 18   Temp: 36.8 ??C (98.2 ??F)      CONSTITUTIONAL: Alert, well appearing, no distress  HEENT: Moist mucous membranes, oropharynx clear without erythema or exudate  EYES: Extra ocular movements intact. Pupils reactive, sclerae anicteric.  NECK: Supple, no lymphadenopathy  CARDIOVASCULAR: Regular, normal S1/S2 heart sounds, no murmurs, no rubs.   PULM: Clear to auscultation bilaterally  GASTROINTESTINAL: Soft, active bowel sounds, nontender  EXTREMITIES: No lower extremity edema bilaterally.   SKIN: No rashes or lesions  NEUROLOGIC: No focal motor or sensory deficits  PSYCH: alert and oriented x 3    MEDICAL DECISION MAKING    Results for orders placed or performed during the hospital encounter of 05/05/22   COVID-19 PCR  Specimen: Nasopharyngeal Swab   Result Value Ref Range SARS-CoV-2 PCR Negative Negative   CBC   Result Value Ref Range    WBC 8.5 3.6 - 11.2 10*9/L    RBC 5.37 4.26 - 5.60 10*12/L    HGB 16.0 12.9 - 16.5 g/dL    HCT 16.1 09.6 - 04.5 %    MCV 86.3 77.6 - 95.7 fL    MCH 29.7 25.9 - 32.4 pg    MCHC 34.5 32.0 - 36.0 g/dL    RDW 40.9 81.1 - 91.4 %    MPV 9.5 6.8 - 10.7 fL    Platelet 129 (L) 150 - 450 10*9/L   Comprehensive Metabolic Panel   Result Value Ref Range    Sodium 130 (L) 135 - 145 mmol/L    Potassium 5.1 (H) 3.4 - 4.8 mmol/L    Chloride 101 98 - 107 mmol/L    CO2 21.0 20.0 - 31.0 mmol/L    Anion Gap 8 5 - 14 mmol/L    BUN 49 (H) 9 - 23 mg/dL    Creatinine 7.82 (H) 0.73 - 1.18 mg/dL    BUN/Creatinine Ratio 14     eGFR CKD-EPI (2021) Male 21 (L) >=60 mL/min/1.65m2    Glucose 507 (HH) 70 - 99 mg/dL    Calcium 8.8 8.7 - 95.6 mg/dL    Albumin 3.0 (L) 3.4 - 5.0 g/dL    Total Protein 5.9 5.7 - 8.2 g/dL    Total Bilirubin 0.5 0.3 - 1.2 mg/dL    AST 34 <=21 U/L    ALT 54 (H) 10 - 49 U/L    Alkaline Phosphatase 248 (H) 46 - 116 U/L   Basic metabolic panel   Result Value Ref Range    Sodium 133 (L) 135 - 145 mmol/L    Potassium 4.8 3.4 - 4.8 mmol/L    Chloride 106 98 - 107 mmol/L    CO2 18.0 (L) 20.0 - 31.0 mmol/L    Anion Gap 9 5 - 14 mmol/L    BUN 27 (H) 9 - 23 mg/dL    Creatinine 3.08 (H) 0.73 - 1.18 mg/dL    BUN/Creatinine Ratio 7     eGFR CKD-EPI (2021) Male 19 (L) >=60 mL/min/1.71m2    Glucose 173 70 - 179 mg/dL    Calcium 8.4 (L) 8.7 - 10.4 mg/dL   CBC   Result Value Ref Range    WBC 9.0 3.6 - 11.2 10*9/L    RBC 5.14 4.26 - 5.60 10*12/L    HGB 14.8 12.9 - 16.5 g/dL    HCT 65.7 84.6 - 96.2 %    MCV 85.9 77.6 - 95.7 fL    MCH 28.8 25.9 - 32.4 pg    MCHC 33.6 32.0 - 36.0 g/dL    RDW 95.2 84.1 - 32.4 %    MPV 9.0 6.8 - 10.7 fL    Platelet 90 (L) 150 - 450 10*9/L   Magnesium Level   Result Value Ref Range    Magnesium 2.2 1.6 - 2.6 mg/dL   Basic Metabolic Panel   Result Value Ref Range    Sodium 131 (L) 135 - 145 mmol/L    Potassium 5.0 (H) 3.4 - 4.8 mmol/L    Chloride 103 98 - 107 mmol/L    CO2 20.0 20.0 - 31.0 mmol/L    Anion Gap 8 5 - 14 mmol/L    BUN 55 (H) 9 - 23 mg/dL    Creatinine 4.01 (H) 0.73 - 1.18  mg/dL    BUN/Creatinine Ratio 14     eGFR CKD-EPI (2021) Male 17 (L) >=60 mL/min/1.45m2    Glucose 206 (H) 70 - 179 mg/dL    Calcium 8.3 (L) 8.7 - 10.4 mg/dL   Basic metabolic panel   Result Value Ref Range    Sodium 127 (L) 135 - 145 mmol/L    Potassium 4.7 3.4 - 4.8 mmol/L    Chloride 100 98 - 107 mmol/L    CO2 17.0 (L) 20.0 - 31.0 mmol/L    Anion Gap 10 5 - 14 mmol/L    BUN 65 (H) 9 - 23 mg/dL    Creatinine 1.61 (H) 0.73 - 1.18 mg/dL    BUN/Creatinine Ratio 11     eGFR CKD-EPI (2021) Male 11 (L) >=60 mL/min/1.18m2    Glucose 304 (H) 70 - 179 mg/dL    Calcium 8.3 (L) 8.7 - 10.4 mg/dL   CBC   Result Value Ref Range    WBC 9.2 3.6 - 11.2 10*9/L    RBC 4.47 4.26 - 5.60 10*12/L    HGB 13.0 12.9 - 16.5 g/dL    HCT 09.6 (L) 04.5 - 48.0 %    MCV 84.2 77.6 - 95.7 fL    MCH 29.1 25.9 - 32.4 pg    MCHC 34.5 32.0 - 36.0 g/dL    RDW 40.9 81.1 - 91.4 %    MPV 9.1 6.8 - 10.7 fL    Platelet 74 (L) 150 - 450 10*9/L   Magnesium Level   Result Value Ref Range    Magnesium 1.8 1.6 - 2.6 mg/dL   Basic Metabolic Panel   Result Value Ref Range    Sodium 125 (L) 135 - 145 mmol/L    Potassium 5.2 (H) 3.4 - 4.8 mmol/L    Chloride 100 98 - 107 mmol/L    CO2 14.0 (L) 20.0 - 31.0 mmol/L    Anion Gap 11 5 - 14 mmol/L    BUN 50 (H) 9 - 23 mg/dL    Creatinine 7.82 (H) 0.73 - 1.18 mg/dL    BUN/Creatinine Ratio 8     eGFR CKD-EPI (2021) Male 10 (L) >=60 mL/min/1.41m2    Glucose 171 70 - 179 mg/dL    Calcium 8.4 (L) 8.7 - 10.4 mg/dL   Magnesium Level   Result Value Ref Range    Magnesium 1.9 1.6 - 2.6 mg/dL   Basic Metabolic Panel   Result Value Ref Range    Sodium 125 (L) 135 - 145 mmol/L    Potassium 5.2 (H) 3.4 - 4.8 mmol/L    Chloride 99 98 - 107 mmol/L    CO2 15.0 (L) 20.0 - 31.0 mmol/L    Anion Gap 11 5 - 14 mmol/L    BUN 69 (H) 9 - 23 mg/dL    Creatinine 9.56 (H) 0.73 - 1.18 mg/dL    BUN/Creatinine Ratio 10     eGFR CKD-EPI (2021) Male 9 (L) >=60 mL/min/1.107m2    Glucose 174 (H) 70 - 99 mg/dL    Calcium 8.3 (L) 8.7 - 10.4 mg/dL   CBC   Result Value Ref Range    WBC 10.1 3.6 - 11.2 10*9/L    RBC 4.58 4.26 - 5.60 10*12/L    HGB 13.5 12.9 - 16.5 g/dL    HCT 21.3 (L) 08.6 - 48.0 %    MCV 84.2 77.6 - 95.7 fL    MCH 29.5 25.9 - 32.4 pg    MCHC 35.0 32.0 -  36.0 g/dL    RDW 16.1 09.6 - 04.5 %    MPV 9.8 6.8 - 10.7 fL    Platelet 73 (L) 150 - 450 10*9/L   Basic Metabolic Panel   Result Value Ref Range    Sodium 124 (L) 135 - 145 mmol/L    Potassium 5.4 (H) 3.4 - 4.8 mmol/L    Chloride 100 98 - 107 mmol/L    CO2 13.0 (L) 20.0 - 31.0 mmol/L    Anion Gap 11 5 - 14 mmol/L    BUN 46 (H) 9 - 23 mg/dL    Creatinine 4.09 (H) 0.73 - 1.18 mg/dL    BUN/Creatinine Ratio 6     eGFR CKD-EPI (2021) Male 8 (L) >=60 mL/min/1.45m2    Glucose 177 70 - 179 mg/dL    Calcium 8.5 (L) 8.7 - 10.4 mg/dL   Urinalysis with Microscopy   Result Value Ref Range    Color, UA Colorless     Clarity, UA Clear     Specific Gravity, UA 1.010 1.003 - 1.030    pH, UA 5.5 5.0 - 9.0    Leukocyte Esterase, UA Negative Negative    Nitrite, UA Negative Negative    Protein, UA 200 mg/dL (A) Negative    Glucose, UA 300 mg/dL (A) Negative    Ketones, UA Negative Negative    Urobilinogen, UA <2.0 mg/dL <8.1 mg/dL    Bilirubin, UA Negative Negative    Blood, UA Small (A) Negative    RBC, UA 3 <=3 /HPF    WBC, UA 1 <=2 /HPF    Squam Epithel, UA <1 0 - 5 /HPF    Bacteria, UA None Seen None Seen /HPF    Mucus, UA Rare (A) None Seen /HPF   Basic Metabolic Panel   Result Value Ref Range    Sodium 135 135 - 145 mmol/L    Potassium 4.5 3.4 - 4.8 mmol/L    Chloride 103 98 - 107 mmol/L    CO2 17.0 (L) 20.0 - 31.0 mmol/L    Anion Gap 15 (H) 5 - 14 mmol/L    BUN 69 (H) 9 - 23 mg/dL    Creatinine 1.91 (H) 0.73 - 1.18 mg/dL    BUN/Creatinine Ratio 9     eGFR CKD-EPI (2021) Male 8 (L) >=60 mL/min/1.24m2    Glucose 80 70 - 179 mg/dL    Calcium 9.1 8.7 - 47.8 mg/dL   CBC   Result Value Ref Range    WBC 8.6 3.6 - 11.2 10*9/L    RBC 4.49 4.26 - 5.60 10*12/L    HGB 13.0 12.9 - 16.5 g/dL    HCT 29.5 (L) 62.1 - 48.0 %    MCV 83.5 77.6 - 95.7 fL    MCH 29.0 25.9 - 32.4 pg    MCHC 34.7 32.0 - 36.0 g/dL    RDW 30.8 65.7 - 84.6 %    MPV 9.3 6.8 - 10.7 fL    Platelet 86 (L) 150 - 450 10*9/L   Magnesium Level   Result Value Ref Range    Magnesium 2.0 1.6 - 2.6 mg/dL   Hemoglobin N6E   Result Value Ref Range    Hemoglobin A1C 6.8 (H) 4.8 - 5.6 %    Estimated Average Glucose 148 mg/dL   Lipid Panel   Result Value Ref Range    Triglycerides 123 0 - 150 mg/dL    Cholesterol 952 <=841 mg/dL    HDL 59 40 - 60  mg/dL    LDL Calculated 50 40 - 99 mg/dL    VLDL Cholesterol Cal 24.6 12 - 47 mg/dL    Chol/HDL Ratio 2.3 1.0 - 4.5    Non-HDL Cholesterol 75 70 - 130 mg/dL    FASTING Unknown    Basic Metabolic Panel   Result Value Ref Range    Sodium 135 135 - 145 mmol/L    Potassium 4.4 3.4 - 4.8 mmol/L    Chloride 104 98 - 107 mmol/L    CO2 21.0 20.0 - 31.0 mmol/L    Anion Gap 10 5 - 14 mmol/L    BUN 74 (H) 9 - 23 mg/dL    Creatinine 1.61 (H) 0.73 - 1.18 mg/dL    BUN/Creatinine Ratio 11     eGFR CKD-EPI (2021) Male 9 (L) >=60 mL/min/1.49m2    Glucose 241 (H) 70 - 179 mg/dL    Calcium 9.4 8.7 - 09.6 mg/dL   CBC   Result Value Ref Range    WBC 7.8 3.6 - 11.2 10*9/L    RBC 4.74 4.26 - 5.60 10*12/L    HGB 13.7 12.9 - 16.5 g/dL    HCT 04.5 40.9 - 81.1 %    MCV 85.4 77.6 - 95.7 fL    MCH 28.8 25.9 - 32.4 pg    MCHC 33.8 32.0 - 36.0 g/dL    RDW 91.4 78.2 - 95.6 %    MPV 8.6 6.8 - 10.7 fL    Platelet 89 (L) 150 - 450 10*9/L   Magnesium Level   Result Value Ref Range    Magnesium 2.2 1.6 - 2.6 mg/dL   Phosphorus Level   Result Value Ref Range    Phosphorus 6.0 (H) 2.4 - 5.1 mg/dL   Basic Metabolic Panel   Result Value Ref Range    Sodium 137 135 - 145 mmol/L    Potassium 4.4 3.4 - 4.8 mmol/L    Chloride 104 98 - 107 mmol/L    CO2 25.0 20.0 - 31.0 mmol/L    Anion Gap 8 5 - 14 mmol/L    BUN 70 (H) 9 - 23 mg/dL    Creatinine 2.13 (H) 0.73 - 1.18 mg/dL    BUN/Creatinine Ratio 14     eGFR CKD-EPI (2021) Male 13 (L) >=60 mL/min/1.59m2    Glucose 239 (H) 70 - 179 mg/dL    Calcium 8.9 8.7 - 08.6 mg/dL   CBC   Result Value Ref Range    Results Verified by Slide Scan Slide Reviewed     WBC 7.8 3.6 - 11.2 10*9/L    RBC 4.69 4.26 - 5.60 10*12/L    HGB 13.6 12.9 - 16.5 g/dL    HCT 57.8 46.9 - 62.9 %    MCV 85.2 77.6 - 95.7 fL    MCH 29.1 25.9 - 32.4 pg    MCHC 34.1 32.0 - 36.0 g/dL    RDW 52.8 41.3 - 24.4 %    MPV 8.6 6.8 - 10.7 fL    Platelet 94 (L) 150 - 450 10*9/L   Basic Metabolic Panel   Result Value Ref Range    Sodium 139 135 - 145 mmol/L    Potassium 4.3 3.4 - 4.8 mmol/L    Chloride 105 98 - 107 mmol/L    CO2 26.0 20.0 - 31.0 mmol/L    Anion Gap 8 5 - 14 mmol/L    BUN 63 (H) 9 - 23 mg/dL    Creatinine 0.10 (H) 0.73 -  1.18 mg/dL    BUN/Creatinine Ratio 14     eGFR CKD-EPI (2021) Male 16 (L) >=60 mL/min/1.49m2    Glucose 201 (H) 70 - 179 mg/dL    Calcium 9.4 8.7 - 16.1 mg/dL   CBC   Result Value Ref Range    WBC 7.9 3.6 - 11.2 10*9/L    RBC 4.97 4.26 - 5.60 10*12/L    HGB 14.3 12.9 - 16.5 g/dL    HCT 09.6 04.5 - 40.9 %    MCV 85.9 77.6 - 95.7 fL    MCH 28.8 25.9 - 32.4 pg    MCHC 33.5 32.0 - 36.0 g/dL    RDW 81.1 91.4 - 78.2 %    MPV 9.0 6.8 - 10.7 fL    Platelet 107 (L) 150 - 450 10*9/L   ECG 12 Lead   Result Value Ref Range    EKG Systolic BP  mmHg    EKG Diastolic BP  mmHg    EKG Ventricular Rate 74 BPM    EKG Atrial Rate 74 BPM    EKG P-R Interval 172 ms    EKG QRS Duration 166 ms    EKG Q-T Interval 418 ms    EKG QTC Calculation 463 ms    EKG Calculated P Axis 57 degrees    EKG Calculated R Axis -22 degrees    EKG Calculated T Axis 72 degrees    QTC Fredericia 448 ms   Type and Screen   Result Value Ref Range    ABO Grouping O POS     Antibody Screen NEG    Prepare RBC   Result Value Ref Range    Crossmatch Compatible     Unit Blood Type O Pos     ISBT Number 5100     Unit # [id number]     Status Released to Avail     Spec Expiration [id number]     Product ID Red Blood Cells     PRODUCT CODE [id number]     Crossmatch Compatible     Unit Blood Type O Pos     ISBT Number 5100     Unit # [id number]     Status Released to Avail     Spec Expiration [id number]     Product ID Red Blood Cells     PRODUCT CODE [id number]    POCT Glucose   Result Value Ref Range    Glucose, POC >600 (HH) 70 - 179 mg/dL   POCT Oximetry   Result Value Ref Range    Hemoglobin, POC 15.6 13.5 - 17.5 g/dL    Oxyhemoglobin, POC 01.6 Interpret %   POCT Oximetry   Result Value Ref Range    Hemoglobin, POC 14.6 13.5 - 17.5 g/dL    Oxyhemoglobin, POC 01.0 Interpret %   POCT Oximetry   Result Value Ref Range    Hemoglobin, POC 15.2 13.5 - 17.5 g/dL    Oxyhemoglobin, POC 93.2 Interpret %   POCT Glucose   Result Value Ref Range    Glucose, POC 259 (H) 70 - 179 mg/dL   POCT Glucose   Result Value Ref Range    Glucose, POC 388 (H) 70 - 179 mg/dL   POCT Glucose   Result Value Ref Range    Glucose, POC 201 (H) 70 - 179 mg/dL   POCT Glucose   Result Value Ref Range    Glucose, POC 208 (H) 70 - 179 mg/dL   POCT Glucose   Result  Value Ref Range    Glucose, POC 214 (H) 70 - 179 mg/dL   POCT Glucose   Result Value Ref Range    Glucose, POC 266 (H) 70 - 179 mg/dL   POCT Glucose   Result Value Ref Range    Glucose, POC 168 70 - 179 mg/dL   POCT Glucose   Result Value Ref Range    Glucose, POC 198 (H) 70 - 179 mg/dL   POCT Glucose   Result Value Ref Range    Glucose, POC 235 (H) 70 - 179 mg/dL   POCT Glucose   Result Value Ref Range    Glucose, POC 99 70 - 179 mg/dL   POCT Glucose   Result Value Ref Range    Glucose, POC 158 70 - 179 mg/dL   POCT Glucose   Result Value Ref Range    Glucose, POC 70 70 - 179 mg/dL   POCT Glucose   Result Value Ref Range    Glucose, POC 132 70 - 179 mg/dL   POCT Glucose   Result Value Ref Range    Glucose, POC 137 70 - 179 mg/dL   POCT Glucose   Result Value Ref Range    Glucose, POC 184 (H) 70 - 179 mg/dL   POCT Activated Clotting Time   Result Value Ref Range    Activated Clotting Time 211  Provider Interpretation (sec)   POCT Activated Clotting Time   Result Value Ref Range    Activated Clotting Time 201  Provider Interpretation (sec)   POCT Activated Clotting Time   Result Value Ref Range    Activated Clotting Time 213  Provider Interpretation (sec)   POCT Glucose   Result Value Ref Range    Glucose, POC 268 (H) 70 - 179 mg/dL   POCT Glucose   Result Value Ref Range    Glucose, POC 166 70 - 179 mg/dL   POCT Glucose   Result Value Ref Range    Glucose, POC 249 (H) 70 - 179 mg/dL   POCT Glucose   Result Value Ref Range    Glucose, POC 204 (H) 70 - 179 mg/dL   POCT Glucose   Result Value Ref Range    Glucose, POC 171 70 - 179 mg/dL   POCT Glucose   Result Value Ref Range    Glucose, POC 238 (H) 70 - 179 mg/dL   POCT Glucose   Result Value Ref Range    Glucose, POC 242 (H) 70 - 179 mg/dL   POCT Glucose   Result Value Ref Range    Glucose, POC 254 (H) 70 - 179 mg/dL   POCT Glucose   Result Value Ref Range    Glucose, POC 222 (H) 70 - 179 mg/dL        Creatinine   Date Value Ref Range Status   05/12/2022 4.37 (H) 0.73 - 1.18 mg/dL Final   16/01/9603 5.40 (H) 0.73 - 1.18 mg/dL Final   98/02/9146 8.29 (H) 0.73 - 1.18 mg/dL Final   56/21/3086 5.78 (H) 0.73 - 1.18 mg/dL Final   46/96/2952 8.41 (H) 0.73 - 1.18 mg/dL Final     No results found for: GFR   BUN   Date Value Ref Range Status   05/12/2022 63 (H) 9 - 23 mg/dL Final   32/44/0102 70 (H) 9 - 23 mg/dL Final   72/53/6644 74 (H) 9 - 23 mg/dL Final   03/47/4259 69 (H) 9 - 23 mg/dL Final  05/08/2022 46 (H) 9 - 23 mg/dL Final      Phosphorus   Date Value Ref Range Status   05/10/2022 6.0 (H) 2.4 - 5.1 mg/dL Final   54/12/8117 4.5 2.4 - 5.1 mg/dL Final   14/78/2956 2.7 2.4 - 5.1 mg/dL Final   21/30/8657 2.1 (L) 2.4 - 5.1 mg/dL Final   84/69/6295 2.9 2.4 - 5.1 mg/dL Final      Calcium   Date Value Ref Range Status   05/12/2022 9.4 8.7 - 10.4 mg/dL Final   28/41/3244 8.9 8.7 - 10.4 mg/dL Final   04/13/7251 9.4 8.7 - 10.4 mg/dL Final   66/44/0347 9.1 8.7 - 10.4 mg/dL Final   42/59/5638 8.5 (L) 8.7 - 10.4 mg/dL Final      PTH   Date Value Ref Range Status   08/26/2019 5.5 (L) 12.0 - 72.0 pg/mL Final      HGB   Date Value Ref Range Status   05/12/2022 14.3 12.9 - 16.5 g/dL Final   75/64/3329 51.8 12.9 - 16.5 g/dL Final   84/16/6063 01.6 12.9 - 16.5 g/dL Final   04/19/3233 57.3 12.9 - 16.5 g/dL Final   22/05/5425 06.2 12.9 - 16.5 g/dL Final     Hemoglobin, POC   Date Value Ref Range Status   05/05/2022 15.2 13.5 - 17.5 g/dL Final   37/62/8315 17.6 13.5 - 17.5 g/dL Final   16/10/3708 62.6 13.5 - 17.5 g/dL Final   94/85/4627 03.5 (L) 13.5 - 17.5 g/dL Final   00/93/8182 99.3 (L) 13.5 - 17.5 g/dL Final      CO2   Date Value Ref Range Status   05/12/2022 26.0 20.0 - 31.0 mmol/L Final   05/11/2022 25.0 20.0 - 31.0 mmol/L Final   05/10/2022 21.0 20.0 - 31.0 mmol/L Final   05/09/2022 17.0 (L) 20.0 - 31.0 mmol/L Final   05/08/2022 13.0 (L) 20.0 - 31.0 mmol/L Final         Urine Sediment personally reviewed (Result date not found.)       IMAGING STUDIES:   Renal US 05/07/22  No Hydronephrosis     ASSESSMENT/PLAN:    Mr.Trentin [name] is a 52 y.o. patient with a past medical history significant for CKD4 DM HTN PVi s/p Replacement who is being seen in clinic for evaluation of AKI on CKD4.      AKI Chronic Kidney Disease Stage 4  - Patietn with AKI on CKD likley from contrast and ATN which was resolving at discharge had some post ATN diuresis at discharge  - CKD likely from HTN and Diabetes  - BP has been well controlled per the patient as is his blood sugars  - will repeat labs today post hospitalization   - will continue to hold Lisinopril and Empagoflozin for now  - but will try to add back if labs continue to improve  - will also quantify urine at next visit and depending on degree of proteinuria may need to send off GN serologies  - The patient was counseled on avoiding nephrotoxic agents including but not limited to NSAIDs and contrasted studies.    Computed [facility] 2-Year unavailable. Necessary lab results were not found in the last year.  Computed [facility] 5-Year unavailable. Necessary lab results were not found in the last year.      Hypertension:   Continue on Nifedipine 30mg  Daily    Bone Mineral Metabolism:   Will check PTH and Vit D    Anemia:   At goal  Mr.Kayvan [name] will follow up in 1 month          Markham Jordan MD, MS  [facility] Division of Hypertension and Nephrology  May 17, 2022 1:43 PM

## 2022-05-18 NOTE — Unmapped (Signed)
Labs Only

## 2022-05-24 NOTE — Unmapped (Signed)
[facility] Refill Coordination Note    Specialty Medication(s) to be Shipped:   Hematology/Oncology: Scemblix    Other medication(s) to be shipped: No additional medications requested for fill at this time     Colin Hunter, DOB: October 18, 1970  Phone: [phone] (home)       All above HIPAA information was verified with patient.     Was a Nurse, learning disability used for this call? No    Completed refill call assessment today to schedule patient's medication shipment from the [facility] [phone]).  All relevant notes have been reviewed.     Specialty medication(s) and dose(s) confirmed: Regimen is correct and unchanged.   Changes to medications: Colin Hunter reports no changes at this time.  Changes to insurance: No  New side effects reported not previously addressed with a pharmacist or physician: None reported  Questions for the pharmacist: No    Confirmed patient received a Conservation officer, historic buildings and a Surveyor, mining with first shipment. The patient will receive a drug information handout for each medication shipped and additional FDA Medication Guides as required.       DISEASE/MEDICATION-SPECIFIC INFORMATION        N/A    SPECIALTY MEDICATION ADHERENCE     Medication Adherence    Patient reported X missed doses in the last month: 0  Specialty Medication: Scemblix 40 mg  Patient is on additional specialty medications: No  Informant: patient     Were doses missed due to medication being on hold? No    Scemblix 40 mg: 5 days of medicine on hand       REFERRAL TO PHARMACIST     Referral to the pharmacist: Not needed      [facility]     Shipping address confirmed in Epic.     Delivery Scheduled: Yes, Expected medication delivery date: 05/26/22.     Medication will be delivered via UPS to the prescription address in Epic [place].    Colin Hunter Colin Hunter   [facility] Specialty Technician

## 2022-05-25 MED FILL — SCEMBLIX 40 MG TABLET: ORAL | 30 days supply | Qty: 60 | Fill #3

## 2022-06-07 NOTE — Unmapped (Signed)
Lab Results

## 2022-06-15 NOTE — Unmapped (Unsigned)
Outpatient Social Work      Patient Information       Patient Name: Colin Hunter   Clinic: Cardiology   Primary Coverage: Payor: UNITED HEALTHCARE MEDICARE ADV / Plan: UNITED HEALTHCARE DUAL COMPLETE HMO / Product Type: *No Product type* /    Secondary Coverage: None   Financial Assistance Status: Not required at this time   PCP: [facility]            Reason for Contact:  Transportation    Psychosocial Update and Case Management:  Colin Hunter is a 52 y.o. 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  He also has DM, CKD and HFrEF with last LVEF of 45-50% (reported). Pt reports that he needs assistance with transportation.     SW contacted patient's transportation provider with patient permission and spoke to [facility] transportation at length. SW advised that patient lives outside of the coverage area which is limited to or less one-way. SW advised of two other resources Public librarian [phone] and [place] DSS [phone] -Medicaid patient only. SW will contact patient to discuss options.     Follow-Up Plan:  SW available ongoing to address needs related to care.   Patient was provided with my contact information and instructed to call for any psychosocial needs.     Sharlet Salina, MSW, LCSW  Clinic Social Worker - Care Management  Cardiology & Pulmonology  [facility] [place]

## 2022-06-17 NOTE — Unmapped (Incomplete)
SW contacted [facility] DSS to confirm eligibility for Medicaid. SW left a message with Dietitian.     SW received call back from [facility] travel reimbursement program. Pt ineligible for program as he is under age 53.     SW will discuss possible reapplying for Medicaid as he may be eligible due to new financial rules.

## 2022-06-21 NOTE — Unmapped (Signed)
[facility] Refill Coordination Note    Specialty Medication(s) to be Shipped:   Hematology/Oncology: Scemblix    Other medication(s) to be shipped: No additional medications requested for fill at this time     Colin Hunter, DOB: 12/02/1970  Phone: [phone] (home)       All above HIPAA information was verified with patient.     Was a Nurse, learning disability used for this call? No    Completed refill call assessment today to schedule patient's medication shipment from the [facility] [phone]).  All relevant notes have been reviewed.     Specialty medication(s) and dose(s) confirmed: Regimen is correct and unchanged.   Changes to medications: Colin Hunter reports no changes at this time.  Changes to insurance: No  New side effects reported not previously addressed with a pharmacist or physician: None reported  Questions for the pharmacist: No    Confirmed patient received a Conservation officer, historic buildings and a Surveyor, mining with first shipment. The patient will receive a drug information handout for each medication shipped and additional FDA Medication Guides as required.       DISEASE/MEDICATION-SPECIFIC INFORMATION        N/A    SPECIALTY MEDICATION ADHERENCE     Medication Adherence    Patient reported X missed doses in the last month: 3  Specialty Medication: Scemblix 40 mg  Patient is on additional specialty medications: No  Informant: patient     Were doses missed due to medication being on hold? Yes - Was in the hospital for observation    Scemblix 40 mg: 5 days of medicine on hand       REFERRAL TO PHARMACIST     Referral to the pharmacist: Not needed      [facility]     Shipping address confirmed in Epic.     Delivery Scheduled: Yes, Expected medication delivery date: 06/24/22.     Medication will be delivered via UPS to the prescription address in Epic [place].    [name] M [name]   [facility] Specialty Technician

## 2022-06-24 MED FILL — SCEMBLIX 40 MG TABLET: ORAL | 30 days supply | Qty: 60 | Fill #4

## 2022-06-30 ENCOUNTER — Ambulatory Visit: Admit: 2022-06-30 | Discharge: 2022-06-30 | Payer: MEDICARE

## 2022-06-30 ENCOUNTER — Other Ambulatory Visit: Admit: 2022-06-30 | Discharge: 2022-06-30 | Payer: MEDICARE

## 2022-06-30 DIAGNOSIS — N1832 Stage 3b chronic kidney disease (CMS-HCC): Principal | ICD-10-CM

## 2022-06-30 LAB — RENAL FUNCTION PANEL
ALBUMIN: 3.1 g/dL — ABNORMAL LOW (ref 3.4–5.0)
ANION GAP: 4 mmol/L — ABNORMAL LOW (ref 5–14)
BLOOD UREA NITROGEN: 27 mg/dL — ABNORMAL HIGH (ref 9–23)
BUN / CREAT RATIO: 12
CALCIUM: 9.1 mg/dL (ref 8.7–10.4)
CHLORIDE: 110 mmol/L — ABNORMAL HIGH (ref 98–107)
CO2: 22 mmol/L (ref 20.0–31.0)
CREATININE: 2.31 mg/dL — ABNORMAL HIGH
EGFR CKD-EPI (2021) MALE: 33 mL/min/{1.73_m2} — ABNORMAL LOW (ref >=60)
GLUCOSE RANDOM: 258 mg/dL — ABNORMAL HIGH (ref 70–99)
PHOSPHORUS: 4 mg/dL (ref 2.4–5.1)
POTASSIUM: 4.5 mmol/L (ref 3.4–4.8)
SODIUM: 136 mmol/L (ref 135–145)

## 2022-06-30 LAB — PARATHYROID HORMONE (PTH): PARATHYROID HORMONE INTACT: 194.6 pg/mL — ABNORMAL HIGH (ref 18.4–80.1)

## 2022-06-30 NOTE — Unmapped (Signed)
Referring Provider: [facility] [place] [facility]     PCP: [facility] [place] [facility]    06/30/2022    Chief Complaint: AKI on CKD4    HPI:  [name] is a 52 y.o. male with a past medical history of CKD4 DM HTN PVi s/p Replacement who is was seen in consultation at the request of [facility] for evaluation of hospital follow up after AKI on CKD3.       Patient presenting to clinic after having his pulmonary valve replaced in the hospital. Patient developed a severe AKI but has since improved not requiring HD in the hospital Patient with a baseline Cr of 2.8-3 with a rise to ~7 but down to 4.3 at discharge. Patient has been doing well since discharge without complaints. Staying well hydrated. Patietn states that his blood sugars have been in the 200s and blood pressure has been pretty well controlled in the 130s systolic. Patietn denies any NSAID use. Since DC     He denies a family history of kidney disease and denies a personal history of frequent urinary tract infections, kidney stones or excessive NSAID use.      Interval History/Subjective:  Patient is doing well without complaint still has some swelling blood sugars have been in the 150s-170s at home. Blood pressures remain in the 130s at home. Otherwise doing well    Denies fevers, chills, headaches, chest pain, shortness of breath, nausea, vomiting, abdominal pain, diarrhea, dysuria, hematuria, ulcers, arthralgias, syncope or seizures.     ROS:  11 systems reviewed and negative except those noted in the history of present illness    PAST MEDICAL HISTORY:  Past Medical History:   Diagnosis Date    CKD (chronic kidney disease) stage 3, GFR 30-59 ml/min (CMS-HCC)     CML (chronic myelocytic leukemia) (CMS-HCC)     HFrEF (heart failure with reduced ejection fraction) (CMS-HCC)     Paroxysmal atrial fibrillation (CMS-HCC)     PFO (patent foramen ovale)     Right atrial thrombus     T2DM (type 2 diabetes mellitus) (CMS-HCC) ALLERGIES  Patient has no known allergies.    SOCIAL HISTORY  Social History     Socioeconomic History    Marital status: Married   Tobacco Use    Smoking status: Former     Current packs/day: 0.00     Average packs/day: 0.5 packs/day for 6.0 years (3.0 ttl pk-yrs)     Types: Cigarettes     Start date: 09/10/2012     Quit date: 09/11/2018     Years since quitting: 3.8    Smokeless tobacco: Never   Substance and Sexual Activity    Alcohol use: Never    Drug use: Never    Sexual activity: Not Currently     Social Determinants of Health     Financial Resource Strain: Low Risk  (05/06/2022)    Overall Financial Resource Strain (CARDIA)     Difficulty of Paying Living Expenses: Not hard at all   Food Insecurity: No Food Insecurity (05/06/2022)    Hunger Vital Sign     Worried About Running Out of Food in the Last Year: Never true     Ran Out of Food in the Last Year: Never true   Transportation Needs: No Transportation Needs (05/06/2022)    PRAPARE - Therapist, art (Medical): No     Lack of Transportation (Non-Medical): No  FAMILY HISTORY  Family History   Problem Relation Age of Onset    Heart disease Father         MEDICATIONS:  Current Outpatient Medications   Medication Sig Dispense Refill    apixaban (ELIQUIS) 5 mg Tab Take 1 tablet (5 mg total) by mouth Two (2) times a day. 60 tablet 12    asciminib (SCEMBLIX) 40 mg tablet Take 1 tablet (40 mg total) by mouth Two (2) times a day. Take on an empty stomach, at least 1 hour before or 2 hours after a meal. Swallow tablets whole. Do not break, crush, or chew the tablets. 60 tablet 11    BINAXNOW COVID-19 AG SELF TEST Kit       blood pressure monitor Kit 1 each by Miscellaneous route in the morning. 1 kit 0    blood sugar diagnostic Strp       blood-glucose meter Misc       folic acid (FOLVITE) 1 MG tablet Take 1 tablet (1 mg total) by mouth in the morning. 90 tablet 3    insulin glargine (BASAGLAR, LANTUS) 100 unit/mL (3 mL) injection pen Inject 0.4 mL (40 Units total) under the skin two (2) times a day.  0    insulin lispro (HUMALOG) 100 unit/mL injection pen INJECT 26 UNITS THREE TIMES DAILY BEFORE MEAL(S)      JARDIANCE 10 mg tablet Take 1 tablet (10 mg total) by mouth daily.      lancets Misc by Miscellaneous route.      metoPROLOL succinate (TOPROL-XL) 50 MG 24 hr tablet Take 1 tablet (50 mg total) by mouth daily.      NIFEdipine (PROCARDIA XL) 30 MG 24 hr tablet Take 1 tablet (30 mg total) by mouth daily. 30 tablet 0    ONETOUCH VERIO TEST STRIPS Strp USE 1 STRIP TO CHECK GLUCOSE TWICE DAILY      OZEMPIC 0.25 mg or 0.5 mg(2 mg/1.5 mL) PnIj injection Inject 0.5 mg under the skin every seven (7) days.      pregabalin (LYRICA) 150 MG capsule Take 1 capsule (150 mg total) by mouth Three (3) times a day.      rosuvastatin (CRESTOR) 20 MG tablet Take 1 tablet (20 mg total) by mouth daily. 90 tablet 0    varenicline (CHANTIX PAK) 0.5 mg (11)- 1 mg (42) tablet Take 1 Package by mouth two (2) times a day.      VENTOLIN HFA 90 mcg/actuation inhaler Inhale 2 puffs every six (6) hours as needed. 18 g 0    methocarbamol (ROBAXIN) 500 MG tablet Take 1 tablet (500 mg total) by mouth in the morning.      pantoprazole (PROTONIX) 40 MG tablet Take 1 tablet (40 mg total) by mouth daily.       No current facility-administered medications for this visit.       PHYSICAL EXAM:     Vitals:    06/30/22 1138   BP: 141/88   Pulse: 81   Resp: 18   Temp: 36.3 ??C (97.4 ??F)   SpO2: 100%      CONSTITUTIONAL: Alert, well appearing, no distress  HEENT: Moist mucous membranes, oropharynx clear without erythema or exudate  EYES: Extra ocular movements intact. Pupils reactive, sclerae anicteric.  NECK: Supple, no lymphadenopathy  CARDIOVASCULAR: Regular, normal S1/S2 heart sounds, no murmurs, no rubs.   PULM: Clear to auscultation bilaterally  GASTROINTESTINAL: Soft, active bowel sounds, nontender  EXTREMITIES: No lower extremity edema bilaterally.  SKIN: No rashes or lesions  NEUROLOGIC: No focal motor or sensory deficits  PSYCH: alert and oriented x 3    MEDICAL DECISION MAKING    Results for orders placed or performed in visit on 05/17/22   Renal Function Panel   Result Value Ref Range    Sodium 140 135 - 145 mmol/L    Potassium 4.5 3.4 - 4.8 mmol/L    Chloride 109 (H) 98 - 107 mmol/L    CO2 23.0 20.0 - 31.0 mmol/L    Anion Gap 8 5 - 14 mmol/L    BUN 50 (H) 9 - 23 mg/dL    Creatinine 1.09 (H) 0.73 - 1.18 mg/dL    BUN/Creatinine Ratio 19     eGFR CKD-EPI (2021) Male 28 (L) >=60 mL/min/1.72m2    Glucose 74 70 - 179 mg/dL    Calcium 9.8 8.7 - 60.4 mg/dL    Phosphorus 3.1 2.4 - 5.1 mg/dL    Albumin 3.1 (L) 3.4 - 5.0 g/dL        Creatinine   Date Value Ref Range Status   05/17/2022 2.65 (H) 0.73 - 1.18 mg/dL Final   54/12/8117 1.47 (H) 0.73 - 1.18 mg/dL Final   82/95/6213 0.86 (H) 0.73 - 1.18 mg/dL Final   57/84/6962 9.52 (H) 0.73 - 1.18 mg/dL Final   84/13/2440 1.02 (H) 0.73 - 1.18 mg/dL Final     No results found for: GFR   BUN   Date Value Ref Range Status   05/17/2022 50 (H) 9 - 23 mg/dL Final   72/53/6644 63 (H) 9 - 23 mg/dL Final   03/47/4259 70 (H) 9 - 23 mg/dL Final   56/38/7564 74 (H) 9 - 23 mg/dL Final   33/29/5188 69 (H) 9 - 23 mg/dL Final      Phosphorus   Date Value Ref Range Status   05/17/2022 3.1 2.4 - 5.1 mg/dL Final   41/66/0630 6.0 (H) 2.4 - 5.1 mg/dL Final   16/04/930 4.5 2.4 - 5.1 mg/dL Final   35/57/3220 2.7 2.4 - 5.1 mg/dL Final   25/42/7062 2.1 (L) 2.4 - 5.1 mg/dL Final      Calcium   Date Value Ref Range Status   05/17/2022 9.8 8.7 - 10.4 mg/dL Final   37/62/8315 9.4 8.7 - 10.4 mg/dL Final   17/61/6073 8.9 8.7 - 10.4 mg/dL Final   71/09/2692 9.4 8.7 - 10.4 mg/dL Final   85/46/2703 9.1 8.7 - 10.4 mg/dL Final      PTH   Date Value Ref Range Status   08/26/2019 5.5 (L) 12.0 - 72.0 pg/mL Final      HGB   Date Value Ref Range Status   05/12/2022 14.3 12.9 - 16.5 g/dL Final   50/12/3816 29.9 12.9 - 16.5 g/dL Final   37/16/9678 93.8 12.9 - 16.5 g/dL Final 01/25/5101 58.5 27.7 - 16.5 g/dL Final   82/42/3536 14.4 12.9 - 16.5 g/dL Final     Hemoglobin, POC   Date Value Ref Range Status   05/05/2022 15.2 13.5 - 17.5 g/dL Final   31/54/0086 76.1 13.5 - 17.5 g/dL Final   95/12/3265 12.4 13.5 - 17.5 g/dL Final   58/12/9831 82.5 (L) 13.5 - 17.5 g/dL Final   05/39/7673 41.9 (L) 13.5 - 17.5 g/dL Final      CO2   Date Value Ref Range Status   05/17/2022 23.0 20.0 - 31.0 mmol/L Final   05/12/2022 26.0 20.0 - 31.0 mmol/L Final   05/11/2022 25.0 20.0 -  31.0 mmol/L Final   05/10/2022 21.0 20.0 - 31.0 mmol/L Final   05/09/2022 17.0 (L) 20.0 - 31.0 mmol/L Final         Urine Sediment personally reviewed (Result date not found.)       IMAGING STUDIES:   Renal US 05/07/22  No Hydronephrosis     ASSESSMENT/PLAN:    [name] is a 52 y.o. patient with a past medical history significant for CKD4 DM HTN PVi s/p Replacement who is being seen in clinic for evaluation of AKI on CKD4.      AKI Chronic Kidney Disease Stage 4  - Patietn with AKI on CKD likley from contrast and ATN which was resolving at discharge had some post ATN diuresis at discharge now back down to baseline of 2.6  - CKD likely from HTN and Diabetes  - BP has been well controlled per the patient as is his blood sugars  - will repeat labs today  - Patient restarted on Empagoflozin   - if labs are stable today will plan to restart lisinopril as well.   - will also quantify urine at next visit and depending on degree of proteinuria may need to send off GN serologies  - The patient was counseled on avoiding nephrotoxic agents including but not limited to NSAIDs and contrasted studies.    Computed KFRE 2-Year unavailable. One or more values for this score either were not found within the given timeframe or did not fit some other criterion.  Computed [facility] 5-Year unavailable. One or more values for this score either were not found within the given timeframe or did not fit some other criterion.      Hypertension: Continue on Nifedipine 30mg  Daily, Metoprolol 50mg  daily    Bone Mineral Metabolism:   Will check PTH and Vit D    Anemia:   At goal        [name] will follow up in 3 month          Markham Jordan MD, MS  [facility] Division of Hypertension and Nephrology  May 17, 2022 1:43 PM

## 2022-06-30 NOTE — Unmapped (Signed)
Keep your follow up appointment with your cardiologist.    Check you weight with your blood pressures    Labs today    Will let you know when to restart your Lisinopril after the next set of labs    Follow up in 3 month

## 2022-07-01 NOTE — Unmapped (Signed)
Labs Only

## 2022-07-06 LAB — VITAMIN D 25 HYDROXY: VITAMIN D, TOTAL (25OH): 7.8 ng/mL — ABNORMAL LOW (ref 20.0–80.0)

## 2022-07-12 DIAGNOSIS — N1832 Stage 3b chronic kidney disease (CMS-HCC): Principal | ICD-10-CM

## 2022-07-12 MED ORDER — CHOLECALCIFEROL (VITAMIN D3) 75 MCG (3,000 UNIT) TABLET
ORAL_TABLET | Freq: Every day | ORAL | 11 refills | 30 days | Status: CP
Start: 2022-07-12 — End: 2023-07-12

## 2022-07-15 NOTE — Unmapped (Signed)
[facility] Clinical Assessment & Refill Coordination Note    Colin Hunter, DOB: June 06, 1970  Phone: [phone] (home)     All above HIPAA information was verified with patient.     Was a Nurse, learning disability used for this call? No    Specialty Medication(s):   Hematology/Oncology: Scemblix     Current Outpatient Medications   Medication Sig Dispense Refill    apixaban (ELIQUIS) 5 mg Tab Take 1 tablet (5 mg total) by mouth Two (2) times a day. 60 tablet 12    asciminib (SCEMBLIX) 40 mg tablet Take 1 tablet (40 mg total) by mouth Two (2) times a day. Take on an empty stomach, at least 1 hour before or 2 hours after a meal. Swallow tablets whole. Do not break, crush, or chew the tablets. 60 tablet 11    BINAXNOW COVID-19 AG SELF TEST Kit       blood pressure monitor Kit 1 each by Miscellaneous route in the morning. 1 kit 0    blood sugar diagnostic Strp       blood-glucose meter Misc       cholecalciferol, vitamin D3 25 mcg, 1,000 units,, 75 mcg (3,000 unit) Tab Take 1 tablet (75 mcg total) by mouth daily. 30 tablet 11    folic acid (FOLVITE) 1 MG tablet Take 1 tablet (1 mg total) by mouth in the morning. 90 tablet 3    insulin glargine (BASAGLAR, LANTUS) 100 unit/mL (3 mL) injection pen Inject 0.4 mL (40 Units total) under the skin two (2) times a day.  0    insulin lispro (HUMALOG) 100 unit/mL injection pen INJECT 26 UNITS THREE TIMES DAILY BEFORE MEAL(S)      JARDIANCE 10 mg tablet Take 1 tablet (10 mg total) by mouth daily.      lancets Misc by Miscellaneous route.      methocarbamol (ROBAXIN) 500 MG tablet Take 1 tablet (500 mg total) by mouth in the morning.      metoPROLOL succinate (TOPROL-XL) 50 MG 24 hr tablet Take 1 tablet (50 mg total) by mouth daily.      NIFEdipine (PROCARDIA XL) 30 MG 24 hr tablet Take 1 tablet (30 mg total) by mouth daily. 30 tablet 0    ONETOUCH VERIO TEST STRIPS Strp USE 1 STRIP TO CHECK GLUCOSE TWICE DAILY      OZEMPIC 0.25 mg or 0.5 mg(2 mg/1.5 mL) PnIj injection Inject 0.5 mg under the skin every seven (7) days.      pantoprazole (PROTONIX) 40 MG tablet Take 1 tablet (40 mg total) by mouth daily.      pregabalin (LYRICA) 150 MG capsule Take 1 capsule (150 mg total) by mouth Three (3) times a day.      rosuvastatin (CRESTOR) 20 MG tablet Take 1 tablet (20 mg total) by mouth daily. 90 tablet 0    varenicline (CHANTIX PAK) 0.5 mg (11)- 1 mg (42) tablet Take 1 Package by mouth two (2) times a day.      VENTOLIN HFA 90 mcg/actuation inhaler Inhale 2 puffs every six (6) hours as needed. 18 g 0     No current facility-administered medications for this visit.        Changes to medications: Colin Hunter reports no changes at this time.    No Known Allergies    Changes to allergies: No    SPECIALTY MEDICATION ADHERENCE     Scemblix 40 mg: ~12 days of medicine on hand  Are there any concerns with adherence? No    Adherence counseling provided? Not needed    Patient-Reported Symptoms Tracker for Cancer Patients on Oral Chemotherapy     Oral chemotherapy medication name(s): Scemblix  Dose and frequency: 40 mg twice daily  Oral Chemotherapy Start Date:    Baseline? No  Clinic(s) visited: Hematology    Symptom Grouping Question Patient Response   Digestion and Eating Have you felt sick to your stomach? Denies    Had diarrhea? Denies    Constipated? Denies    Not wanting to eat? Denies    Comments      Sleep and Pain Felt very tired even after you rest? Denies    Pain due to cancer medication or cancer? Denies    Comments     Other Side Effects Numbness or tingling in hands and/or feet? Denies    Felt short of breath? Denies    Mouth or throat Sores? Denies    Rash? Denies    Palmar-plantar erythrodysesthesia syndrome?      Rash - acneiform?      Rash - maculo-papular?      How many days over the past month did your cancer medication or cancer keep you from your normal activities?  Write in number of days, 0-30:  0    Other side effects or things you would like to discuss? NA Comments?     Adherence  In the last 30 days, on how many days did you miss at least one dose of any of your [drug name]? Write in number of days, 0-30:  0    What reasons are you having trouble taking your medication [pharmacist: check all that apply]? Specify chemotherapy cycle:        No problems identified    Comments:        Comments       Optional Symptom Tracking Comments:      CLINICAL MANAGEMENT AND INTERVENTION      Clinical Benefit Assessment:    Do you feel the medicine is effective or helping your condition? Yes    Clinical Benefit counseling provided? Not needed    Acute Infection Status:    Acute infections noted within Epic:  No active infections    Patient reported infection: None    Therapy Appropriateness:    Is therapy appropriate and patient progressing towards therapeutic goals? Yes, therapy is appropriate and should be continued    DISEASE/MEDICATION-SPECIFIC INFORMATION      N/A    Is the patient receiving adequate infection prevention treatment? Not applicable    Does the patient have adequate nutritional support? Not applicable    PATIENT SPECIFIC NEEDS     Does the patient have any physical, cognitive, or cultural barriers? No    Is the patient high risk? No    Did the patient require a clinical intervention? No    Does the patient require physician intervention or other additional services (i.e., nutrition, smoking cessation, social work)? No    SOCIAL DETERMINANTS OF HEALTH     At the [facility], we have learned that life circumstances - like trouble affording food, housing, utilities, or transportation can affect the health of many of our patients.   That is why we wanted to ask: are you currently experiencing any life circumstances that are negatively impacting your health and/or quality of life? Patient declined to answer    Social Determinants of Health     Financial Resource Strain: Low  Risk  (05/06/2022)    Overall Financial Resource Strain (CARDIA)     Difficulty of Paying Living Expenses: Not hard at all   Internet Connectivity: Not on file   Food Insecurity: No Food Insecurity (05/06/2022)    Hunger Vital Sign     Worried About Running Out of Food in the Last Year: Never true     Ran Out of Food in the Last Year: Never true   Tobacco Use: Medium Risk (06/30/2022)    Patient History     Smoking Tobacco Use: Former     Smokeless Tobacco Use: Never     Passive Exposure: Not on file   Housing/Utilities: Low Risk  (05/06/2022)    Housing/Utilities     Within the past 12 months, have you ever stayed: outside, in a car, in a tent, in an overnight shelter, or temporarily in someone else's home (i.e. couch-surfing)?: No     Are you worried about losing your housing?: No     Within the past 12 months, have you been unable to get utilities (heat, electricity) when it was really needed?: No   Alcohol Use: Not At Risk (12/31/2020)    Alcohol Use     How often do you have a drink containing alcohol?: Never     How many drinks containing alcohol do you have on a typical day when you are drinking?: 1 - 2     How often do you have 5 or more drinks on one occasion?: Never   Transportation Needs: No Transportation Needs (05/06/2022)    PRAPARE - Transportation     Lack of Transportation (Medical): No     Lack of Transportation (Non-Medical): No   Substance Use: Not on file   Health Literacy: Medium Risk (03/18/2021)    Health Literacy     : Rarely   Physical Activity: Not on file   Interpersonal Safety: Not on file   Stress: Not on file   Intimate Partner Violence: Not on file   Depression: Not at risk (05/25/2022)    Received from [facility]    PHQ-2     Depression Risk: 0   Social Connections: Not on file     Would you be willing to receive help with any of the needs that you have identified today? Not applicable       SHIPPING     Specialty Medication(s) to be Shipped:   Hematology/Oncology: Scemblix 40 mg     Other medication(s) to be shipped: No additional medications requested for fill at this time     Changes to insurance: No    Patient was notified of new phone menu: Yes    Delivery Scheduled: Yes, Expected medication delivery date: 07/21/22.     Medication will be delivered via UPS to the confirmed prescription address in [facility].    The patient will receive a drug information handout for each medication shipped and additional FDA Medication Guides as required.  Verified that patient has previously received a Conservation officer, historic buildings and a Surveyor, mining.    The patient or caregiver noted above participated in the development of this care plan and knows that they can request review of or adjustments to the care plan at any time.      All of the patient's questions and concerns have been addressed.    Kermit Balo, [facility]   [facility] Specialty Pharmacist

## 2022-07-20 DIAGNOSIS — I371 Nonrheumatic pulmonary valve insufficiency: Principal | ICD-10-CM

## 2022-07-20 MED FILL — SCEMBLIX 40 MG TABLET: ORAL | 30 days supply | Qty: 60 | Fill #5

## 2022-07-20 NOTE — Unmapped (Signed)
-----   Message from [name] sent at 07/19/2022  1:38 PM EDT -----  Colin Hunter Afternoon,    Patient called to schedule a follow up.    Wille Celeste.

## 2022-08-26 DIAGNOSIS — Z006 Encounter for examination for normal comparison and control in clinical research program: Principal | ICD-10-CM

## 2022-08-29 ENCOUNTER — Ambulatory Visit: Admit: 2022-08-29 | Payer: MEDICARE | Attending: Pediatric Cardiology | Primary: Pediatric Cardiology

## 2022-08-30 NOTE — Unmapped (Signed)
[facility] Refill Coordination Note    Specialty Medication(s) to be Shipped:   Hematology/Oncology: Scemblix    Other medication(s) to be shipped: No additional medications requested for fill at this time     Colin Hunter, DOB: 06-17-1970  Phone: [phone] (home)       All above HIPAA information was verified with patient.     Was a Nurse, learning disability used for this call? No    Completed refill call assessment today to schedule patient's medication shipment from the [facility] [phone]).  All relevant notes have been reviewed.     Specialty medication(s) and dose(s) confirmed: Regimen is correct and unchanged.   Changes to medications: Colin Hunter reports no changes at this time.  Changes to insurance: No  New side effects reported not previously addressed with a pharmacist or physician: None reported  Questions for the pharmacist: No    Confirmed patient received a Conservation officer, historic buildings and a Surveyor, mining with first shipment. The patient will receive a drug information handout for each medication shipped and additional FDA Medication Guides as required.       DISEASE/MEDICATION-SPECIFIC INFORMATION        N/A    SPECIALTY MEDICATION ADHERENCE     Medication Adherence    Patient reported X missed doses in the last month: 0  Specialty Medication: Scemblix 40 mg  Patient is on additional specialty medications: No  Informant: patient     Were doses missed due to medication being on hold? No    Scemblix 40 mg: 5 days of medicine on hand       REFERRAL TO PHARMACIST     Referral to the pharmacist: Not needed      [facility]     Shipping address confirmed in Epic.     Delivery Scheduled: Yes, Expected medication delivery date: 09/01/22.     Medication will be delivered via UPS to the prescription address in Epic [place].    Colin Hunter   [facility] Specialty Technician

## 2022-08-31 MED FILL — SCEMBLIX 40 MG TABLET: ORAL | 30 days supply | Qty: 60 | Fill #6

## 2022-09-01 DIAGNOSIS — C9210 Chronic myeloid leukemia, BCR/ABL-positive, not having achieved remission: Principal | ICD-10-CM

## 2022-09-01 NOTE — Unmapped (Signed)
Hi,    We are attempting to schedule patient Colin Hunter for a LAB ORDERS NEEDED FOR APPT ON 09/29/22.  An order is needed to proceed with scheduling the appointment.      Please place an order and provide updates when complete so our team can finalize the scheduling request.    Thank you,   Rosary Lively  [facility]   [phone]

## 2022-09-21 NOTE — Unmapped (Signed)
[facility] Refill Coordination Note    Specialty Medication(s) to be Shipped:   Hematology/Oncology: Scemblix    Other medication(s) to be shipped: No additional medications requested for fill at this time     Colin Hunter, DOB: 1971-01-18  Phone: [phone] (home)       All above HIPAA information was verified with patient.     Was a Nurse, learning disability used for this call? No    Completed refill call assessment today to schedule patient's medication shipment from the [facility] [phone]).  All relevant notes have been reviewed.     Specialty medication(s) and dose(s) confirmed: Regimen is correct and unchanged.   Changes to medications: Colin Hunter reports no changes at this time.  Changes to insurance: No  New side effects reported not previously addressed with a pharmacist or physician: None reported  Questions for the pharmacist: No    Confirmed patient received a Conservation officer, historic buildings and a Surveyor, mining with first shipment. The patient will receive a drug information handout for each medication shipped and additional FDA Medication Guides as required.       DISEASE/MEDICATION-SPECIFIC INFORMATION        N/A    SPECIALTY MEDICATION ADHERENCE     Medication Adherence    Patient reported X missed doses in the last month: 0  Specialty Medication: Scemblix 40 mg  Patient is on additional specialty medications: No  Informant: patient     Were doses missed due to medication being on hold? No    Scemblix 40 mg: 14 days of medicine on hand       REFERRAL TO PHARMACIST     Referral to the pharmacist: Not needed      [facility]     Shipping address confirmed in Epic.     Delivery Scheduled: Yes, Expected medication delivery date: 09/30/22.     Medication will be delivered via UPS to the prescription address in [facility] [place].    [name] M [name]   [facility] Specialty Technician

## 2022-09-29 ENCOUNTER — Ambulatory Visit: Admit: 2022-09-29 | Payer: MEDICARE

## 2022-09-29 MED FILL — SCEMBLIX 40 MG TABLET: ORAL | 30 days supply | Qty: 60 | Fill #7

## 2022-10-03 ENCOUNTER — Ambulatory Visit: Admit: 2022-10-03 | Payer: MEDICARE

## 2022-10-28 NOTE — Unmapped (Signed)
The [facility] has made a second and final attempt to reach this patient to refill the following medication: asciminib: SCEMBLIX 40 mg tablet.      We have left voicemails on the following phone numbers: [phone] and have sent a text message to the following phone numbers: [phone]  .    Dates contacted: 10/19/22 and  10/28/22   Last scheduled delivery: 09/29/22     The patient may be at risk of non-compliance with this medication. The patient should call the [facility] at [phone]  Option 4, then Option 1: Oncology to refill medication.    Ricci Barker   [facility] Specialty Technician

## 2022-11-18 DIAGNOSIS — C9210 Chronic myeloid leukemia, BCR/ABL-positive, not having achieved remission: Principal | ICD-10-CM

## 2022-11-18 NOTE — Unmapped (Signed)
Called and spoke with patient about chemotherapy pill and follow up with [facility]. Per patient he does not have any local provider, and still getting chemo pill through [facility]. Only has 1 bottle left that will last him about 2 weeks. Per patient he went sometime without taking the pill due to being hospitalized and in rehab. At the moment does not have a car so cannot get to [facility] for follow-up. Will plan RTC in 2 weeks, time and date confirmed with patient, and will have social work involved to help with transportation. Also reached out to our intake specialist to get a local provider if needed for the future.

## 2022-11-18 NOTE — Unmapped (Signed)
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 SW reached out to the patient via telephone to let the patient know that I will be reaching out to Colin Hunter to see if they can provide transportation for him on 8/20. SW sent the email to Colin Hunter to see if we can accommodate this patient; Colin Hunter approved the ride. SW email and called Colin Hunter (the person who will be bringing the patient) and provided her with the patient's name, address, phone number and appointment information. SW called the patient to inform him that he will have ride on 8/20. SW also informed him that Colin Hunter will call him to let him know the logistics (what time she will be there to get him). SW asked the patient to please answer all phone calls.     Financial/Insurance: [employer] DUAL COMPLETE HMO/MEDICAID MQB B/E     Psychosocial/Coping: The patient has SW information and will call with any questions/concerns. SW will remain available to provide additional support, information, and resources as needed.          Colin Hunter H. Mayford Knife, MSW, LCSW  Oncology Outpatient Social Worker  [phone]

## 2022-11-23 NOTE — Unmapped (Addendum)
Upon investigation, one additional attempt for patient contact was warranted to refill Scemblix.  The [facility] will reach out to patient on 12/06/22.     Patient's phone number is no longer in service, called and LVM on daughter's Colin Hunter) phone and called and spoke with his friend Colin Hunter and asked him to have [name] return our call.      Kermit Balo, [facility] [place] [facility]  [facility] Shared [facility] Specialty Pharmacist

## 2022-11-26 NOTE — Unmapped (Unsigned)
[facility]ukemia [facility] Follow-up    Patient Name: Colin Hunter  Patient Age: 52 y.o.  Encounter Date: 11/29/2022    Primary Care Provider:  [facility]    Referring Physician:  Vertell Limber Windell Norfolk, MD  [address]  [id number]  [place],  [place] [address]    Reason for visit: CML follow up    Assessment:  Colin Hunter is a 52 y.o. 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 Given the cardiac and metabolic toxicities of nilotinib, ponatinib and dasatinib, bosutinib was tried as a 2nd line agent, which did not result in molecular response, though drug adherence may have been an issue early in his course. He has been on asciminib 40 mg BID since 12/07/2020 and has had complete hematologic response. He presents today for follow up.    [name] presents to clinic today feeling okay.  He states he is tolerating treatment with asciminib well without toxicities or complications.  Unfortunately he has had recent hospitalization locally for cellulitis of his perineum.  He has completed all antibiotics for his admission and has been seen by his PCP.  In terms of his CML he has had a CMR since starting asciminib.  He states he did miss a few doses while he was admitted, but otherwise reports compliance.  BCR-ABL pending today, this will be the first assessment since starting asciminib.  As long as BCR-ABL down trending we will plan to follow every 3 months with follow up and BCR-ABL PCR monitoring.     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   - On Asciminib 40 mg BID      T2DM:  Noted to have serum glucose of 443 in clinic today with other electrolyte abnormalities.  Did not take morning 26U of insulin prior to cinic visit this morning and ate breakfast.  Will plan for 1 time dose of insulin and IVF bolus in infusion.      Cardiac Issues:  HFrEF (EF 30-35%): follow up with Dr. Derinda Sis at [facility]. If he fails asciminib, consultation with cardio-oncology would be reasonable before considering alternate agents that have known cardiac toxicity.  - Right atrial thrombus: Currently on Eliquis 5mg  BID  - Cardioversion 5/23 due to [facility] with atrial flutter    CKD Stage 3: Baseline creatinine ~2-3. Likely secondary to diabetic and hypertensive nephropathy. Cr today is 2.48       Plan and Recommendations:  - continue asciminib 40mg  BID  - plan for 15U lispro insulin x 1  - 1L NS IVF bolus  - follow up BCR-ABL PCR from today  - RTC in 3 months for clinic visit and BCR-ABL PCR monitoring      Dr. Vertell Limber was available     Arna Medici, AGNP-BC  Leukemia Research Nurse Practitioner  Hematology/Oncology Division  [facility]  11/29/2022    I personally spent 60 minutes face-to-face and non-face-to-face in the care of this patient, which includes all pre, intra, and post visit time on the date of service.  All documented time was specific to the E/M visit and does not include any procedures that may have been performed.    History of Present Illness:  Hematology/Oncology History Overview Note   CML, chronic phase:   Leukocytosis to ~30  at time of diagnosis with normal platelet count and Hgb.     Bone marrow biopsy 06/26/2015:   CML, chronic phase (2% blasts, 2% promyelocytes)    Aspirate Smear:   The aspirate smear is hypercellular with rare small spicules.   Myeloid elements are increased with full maturation, without significant   cytologic atypia. Erythroid elements are markedly decreased, without significant cytologic atypia. The myeloid to erythroid ratio is >10:1. Many small hypolobated megakaryocytes are present. Storage iron is present; ringed sideroblasts are not identified.     A differential count of 200 nucleated cells shows the following   percentages:   2 Blasts   2 Promyelocytes   31 Myelocytes/Metamyelocytes   51 Bands/Neutrophils   3 Monocytes   3 Eosinophils   1 Basophils   1 Lymphocytes   0 Plasma cells   6 NRBCs     Biopsy:   H&E and PAS stained sections show unremarkable trabecular bone and markedly hypercellular marrow for age (95% cellularity). The myeloid to erythroid ratio is >5:1. Erythroid elements are decreased with full maturation. Myeloid elements are markedly increased with full maturation.   Megakaryocytes are increased with many small hypolobated forms.   Scattered small lymphocytes and plasma cells account for <5% of   cellularity. A reticulin stain shows grade 1 of 3 fibrosis (European Consensus System).     Flow cytometry:   Flow cytometry performed on the bone marrow aspirate ([id number])   demonstrates a non-discrete population of blasts and maturing   myelomonocytic cells within the blast gate (<2% of total events).     CBC:   A tandem CBC shows: WBC 27.9 (55.7% neutrophils, 0.9% bands, 0.9%   metamyelocytes, 11.3% myelocytes, 10.4% lymphocytes, 13.2% monocytes, 1.0%   eosinophils, 4.7% basophils), Hgb 13.4, MCV 87, Plt 224.     Treatment:   Imatinib, 400mg , 06/26/2015-05/17/2019  Bosutiib, 400 mg 10/04/2019 -     BCR/ABL testing:   06/2015: 87.086%  07/17/2015: 140.922%  08/18/2015: 75.693%  12/08/2015: 2.144%  01/07/2016: 2.450%  03/29/2016: 2.008%  07/26/2016: 2.871%; Kinase domain mutation testing negative  10/28/2016: 1.247%  01/05/2017: 0.641%  03/14/2017: 0.348%; Kinase domain mutation testing negative  06/25/19: Result not available   07/26/19: Result not available      07/26/19 FISH: positive t(9;22) in ~90.5% of cells     Final Diagnosis   Date Value Ref Range Status   08/20/2019   Final    Bone marrow, right iliac, aspiration and biopsy  -  Hypercellular bone marrow (95%) involved by chronic myeloid leukemia, BCR-ABL1-positive, chronic phase (2% blasts by manual aspirate differential)  -  See linked reports for associated Ancillary Studies.      This electronic signature is attestation that the pathologist personally reviewed the submitted material(s) and the final diagnosis reflects that evaluation.         08/12/19 BCR/ABL: 28.427 %  08/20/19 BCR/ABL: 35.897%  08/12/19 TKI Resistance Testing: Negative  10/04/19: started bosutinib 400 mg daily  07/03/20: BCR/ABL 68.2%, TKI resistance testing negative  11/11/20: BCR/ABL 74.4%, TKI resistance testing negative  12/09/20: started Asciminib 40 mg bid      Current treatment:   Asciminib 40 mg bid    05/27/21 BCR-ABL: 4.0%    10/2021: 1.1%  01/2022: 0.703%     Chronic myeloid leukemia (CMS-HCC)   06/26/2015 Initial Diagnosis    Chronic myeloid leukemia (CMS-HCC)     06/26/2015 - 05/17/2019 Chemotherapy    Imatinib, 400mg  daily; discontinued due to worsening edema  10/04/2019 - 11/2020 Chemotherapy    Bosutinib 400mg      11/11/2020 Progression    Loss of hematologic response with neutrophilia, monocytosis, eosinophilia but not basophilia. Possibly due to non-adherence and association of LE edema with bosutinib as potential toxicity--lack of molecular response but no BCR-ABL point mutations     12/10/2020 -  Chemotherapy    Asciminib 40 mg PO BID     01/04/2021 Remission    Complete hematologic response         Interval History:  Since last seen [name] states that he is feeling better.  He was recently admitted for perineal cellulitis and has completed all abx since discharge.  Today he presents with serum BG of >400.  He reports that he takes 40U of Lantus insulin at night and took it last night, but he is also supposed to take 26U of SSI with meals.  He did not take that this morning and he ate breakfast on the way to his appointment.  He denies any n/v/d.  He reports a good appetite.  Denies any sob/cough.      Otherwise, he denies new constitutional symptoms such as anorexia, weight loss, night sweats or unexplained fevers.  Furthermore, he denies symptoms of marrow failure: unexplained bleeding or bruising, recurrent or unexplained intercurrent infections, dyspnea on exertion, palpitations or chest pain.  There have been no new or unexplained pains or self-identified masses, swelling or enlarged lymph nodes.    Past Medical, Surgical and Family History were reviewed and pertinent updates were made in the Electronic Medical Record    Review of Systems:  Other than as reported above in the interim history, the balance of a full 12-system review was performed and unremarkable.    ECOG Performance Status: 0    Past Medical History:  Past Medical History:   Diagnosis Date    CKD (chronic kidney disease) stage 3, GFR 30-59 ml/min (CMS-HCC)     CML (chronic myelocytic leukemia) (CMS-HCC)     HFrEF (heart failure with reduced ejection fraction) (CMS-HCC)     Paroxysmal atrial fibrillation (CMS-HCC)     PFO (patent foramen ovale)     Right atrial thrombus     T2DM (type 2 diabetes mellitus) (CMS-HCC)        Medications:  Current Outpatient Medications   Medication Sig Dispense Refill    apixaban (ELIQUIS) 5 mg Tab Take 1 tablet (5 mg total) by mouth Two (2) times a day. 60 tablet 12    asciminib (SCEMBLIX) 40 mg tablet Take 1 tablet (40 mg total) by mouth Two (2) times a day. Take on an empty stomach, at least 1 hour before or 2 hours after a meal. Swallow tablets whole. Do not break, crush, or chew the tablets. 60 tablet 11    BINAXNOW COVID-19 AG SELF TEST Kit       blood pressure monitor Kit 1 each by Miscellaneous route in the morning. 1 kit 0    blood sugar diagnostic Strp       blood-glucose meter Misc       cholecalciferol, vitamin D3 25 mcg, 1,000 units,, 75 mcg (3,000 unit) Tab Take 1 tablet (75 mcg total) by mouth daily. 30 tablet 11    folic acid (FOLVITE) 1 MG tablet Take 1 tablet (1 mg total) by mouth in the morning. 90 tablet 3    insulin glargine (BASAGLAR, LANTUS) 100 unit/mL (3 mL) injection pen Inject 0.4 mL (40 Units total) under the skin two (  2) times a day.  0    insulin lispro (HUMALOG) 100 unit/mL injection pen INJECT 26 UNITS THREE TIMES DAILY BEFORE MEAL(S)      JARDIANCE 10 mg tablet Take 1 tablet (10 mg total) by mouth daily.      lancets Misc by Miscellaneous route.      methocarbamol (ROBAXIN) 500 MG tablet Take 1 tablet (500 mg total) by mouth in the morning.      NIFEdipine (PROCARDIA XL) 30 MG 24 hr tablet Take 1 tablet (30 mg total) by mouth daily. 30 tablet 0    ONETOUCH VERIO TEST STRIPS Strp USE 1 STRIP TO CHECK GLUCOSE TWICE DAILY      OZEMPIC 0.25 mg or 0.5 mg(2 mg/1.5 mL) PnIj injection Inject 0.5 mg under the skin every seven (7) days.      pantoprazole (PROTONIX) 40 MG tablet Take 1 tablet (40 mg total) by mouth daily.      pregabalin (LYRICA) 150 MG capsule Take 1 capsule (150 mg total) by mouth Three (3) times a day.      rosuvastatin (CRESTOR) 20 MG tablet Take 1 tablet (20 mg total) by mouth daily. 90 tablet 0    VENTOLIN HFA 90 mcg/actuation inhaler Inhale 2 puffs every six (6) hours as needed. 18 g 0     No current facility-administered medications for this visit.       Vital Signs:  BSA: There is no height or weight on file to calculate BSA.  There were no vitals filed for this visit.        Physical Exam:  General:  resting, in no apparent distress  HEENT:  Pupils are equal, round and reactive to light and accomodation.  There is no scleral icterus and no conjunctival injection.      Heart:  RRR, Systolic ejection murmur.  S1, S2, no R/G. Lower extremities without pitting edema.  Lungs:  Breathing is unlabored and patient is speaking full sentences with ease. CTAB without rales, ronchi or crackles.    Abdomen:  No distention or pain on palpation.  Bowel sounds are present.  No palpable masses.  Skin:  No rashes, petechiae or purpura.  Grossly intact.   Musculoskeletal:  Range of motion about the shoulder, elbow, hips and knees is grossly normal.  Strength is equal bilaterally.  Neurologic:  Alert and oriented x 4.  Stable gait    Relevant Laboratory, radiology and pathology results:  No visits with results within 1 Day(s) from this visit.   Latest known visit with results is:   Lab on 06/30/2022   Component Date Value Ref Range Status    Vitamin D Total (25OH) 06/30/2022 7.8 (L)  20.0 - 80.0 ng/mL Final    PTH 06/30/2022 194.6 (H)  18.4 - 80.1 pg/mL Final    Sodium 06/30/2022 136  135 - 145 mmol/L Final    Potassium 06/30/2022 4.5  3.4 - 4.8 mmol/L Final    Chloride 06/30/2022 110 (H)  98 - 107 mmol/L Final    CO2 06/30/2022 22.0  20.0 - 31.0 mmol/L Final    Anion Gap 06/30/2022 4 (L)  5 - 14 mmol/L Final    BUN 06/30/2022 27 (H)  9 - 23 mg/dL Final    Creatinine 45/40/9811 2.31 (H)  0.73 - 1.18 mg/dL Final    BUN/Creatinine Ratio 06/30/2022 12   Final    eGFR CKD-EPI (2021) Male 06/30/2022 33 (L)  >=60 mL/min/1.42m2 Final    eGFR calculated with CKD-EPI 2021  equation in accordance with [employer] and [employer] of Nephrology Task Force recommendations.    Glucose 06/30/2022 258 (H)  70 - 99 mg/dL Final    Calcium 16/01/9603 9.1  8.7 - 10.4 mg/dL Final    Phosphorus 54/12/8117 4.0  2.4 - 5.1 mg/dL Final    Albumin 14/78/2956 3.1 (L)  3.4 - 5.0 g/dL Final

## 2022-11-29 NOTE — Unmapped (Signed)
Hi ,    Patient Colin Hunter contacted the Communication Center to reschedule their appointment for today.  The original appointment has been cancelled.    Cancellation Reason: Transportation     Patient has been rescheduled for 12/04/08:30am.    Thank you,  Warnell Bureau  [facility]   [phone]

## 2022-11-29 NOTE — Unmapped (Signed)
Outpatient Oncology Social Work  Follow Up       Psychosocial & Case Management Update: The patient called in stating that his ride Randalyn Rhea) has not picked him up yet. He stated that someone called him on this past Saturday to confirm. SW provided him with the number for Riki Rusk (who the patient said called). Sw called back to see if anyone has picked him up yet and he replied no. SW then gave him Wanda's number to call and see what' the status is.     The patient has SW information and will call with any questions/concerns. SW will remain available to provide additional support, information, and resources as needed.      Lyne Khurana H. Mayford Knife, MSW, LCSW  Oncology Outpatient Social Worker  [phone]

## 2022-12-02 NOTE — Unmapped (Unsigned)
[facility]ukemia Clinic Follow-up    Patient Name: Colin Hunter  Patient Age: 52 y.o.  Encounter Date: 12/05/2022    Primary Care Provider:  [facility]    Referring Physician:  Vertell Limber Windell Norfolk, MD  [address]  [id number]  [place],  [place] [address]    Reason for visit: CML follow up    Assessment:  Colin Hunter is a 52 y.o. 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 Given the cardiac and metabolic toxicities of nilotinib, ponatinib and dasatinib, bosutinib was tried as a 2nd line agent, which did not result in molecular response, though drug adherence may have been an issue early in his course. He has been on asciminib 40 mg BID since 12/07/2020 and has had complete hematologic response. He presents today for follow up.    Colin Hunter presents to clinic today feeling okay.  He states he is tolerating treatment with asciminib well without toxicities or complications.  Unfortunately he has had recent hospitalization locally for cellulitis of his perineum.  He has completed all antibiotics for his admission and has been seen by his PCP.  In terms of his CML he has had a CMR since starting asciminib.  He states he did miss a few doses while he was admitted, but otherwise reports compliance.  BCR-ABL pending today, this will be the first assessment since starting asciminib.  As long as BCR-ABL down trending we will plan to follow every 3 months with follow up and BCR-ABL PCR monitoring.     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   - On Asciminib 40 mg BID      T2DM:  Noted to have serum glucose of 443 in clinic today with other electrolyte abnormalities.  Did not take morning 26U of insulin prior to cinic visit this morning and ate breakfast.  Will plan for 1 time dose of insulin and IVF bolus in infusion.      Cardiac Issues:  HFrEF (EF 30-35%): follow up with Dr. Derinda Sis at [facility]. If he fails asciminib, consultation with cardio-oncology would be reasonable before considering alternate agents that have known cardiac toxicity.  - Right atrial thrombus: Currently on Eliquis 5mg  BID  - Cardioversion 5/23 due to [facility] with atrial flutter    CKD Stage 3: Baseline creatinine ~2-3. Likely secondary to diabetic and hypertensive nephropathy. Cr today is 2.48       Plan and Recommendations:  - continue asciminib 40mg  BID  - plan for 15U lispro insulin x 1  - 1L NS IVF bolus  - follow up BCR-ABL PCR from today  - RTC in 3 months for clinic visit and BCR-ABL PCR monitoring      Dr. Vertell Limber was available     Arna Medici, AGNP-BC  Leukemia Research Nurse Practitioner  Hematology/Oncology Division  [facility]  12/05/2022    I personally spent 60 minutes face-to-face and non-face-to-face in the care of this patient, which includes all pre, intra, and post visit time on the date of service.  All documented time was specific to the E/M visit and does not include any procedures that may have been performed.    History of Present Illness:  Hematology/Oncology History Overview Note   CML, chronic phase:   Leukocytosis to ~30  at time of diagnosis with normal platelet count and Hgb.     Bone marrow biopsy 06/26/2015:   CML, chronic phase (2% blasts, 2% promyelocytes)    Aspirate Smear:   The aspirate smear is hypercellular with rare small spicules.   Myeloid elements are increased with full maturation, without significant   cytologic atypia. Erythroid elements are markedly decreased, without significant cytologic atypia. The myeloid to erythroid ratio is >10:1. Many small hypolobated megakaryocytes are present. Storage iron is present; ringed sideroblasts are not identified.     A differential count of 200 nucleated cells shows the following   percentages:   2 Blasts   2 Promyelocytes   31 Myelocytes/Metamyelocytes   51 Bands/Neutrophils   3 Monocytes   3 Eosinophils   1 Basophils   1 Lymphocytes   0 Plasma cells   6 NRBCs     Biopsy:   H&E and PAS stained sections show unremarkable trabecular bone and markedly hypercellular marrow for age (95% cellularity). The myeloid to erythroid ratio is >5:1. Erythroid elements are decreased with full maturation. Myeloid elements are markedly increased with full maturation.   Megakaryocytes are increased with many small hypolobated forms.   Scattered small lymphocytes and plasma cells account for <5% of   cellularity. A reticulin stain shows grade 1 of 3 fibrosis (European Consensus System).     Flow cytometry:   Flow cytometry performed on the bone marrow aspirate ([id number])   demonstrates a non-discrete population of blasts and maturing   myelomonocytic cells within the blast gate (<2% of total events).     CBC:   A tandem CBC shows: WBC 27.9 (55.7% neutrophils, 0.9% bands, 0.9%   metamyelocytes, 11.3% myelocytes, 10.4% lymphocytes, 13.2% monocytes, 1.0%   eosinophils, 4.7% basophils), Hgb 13.4, MCV 87, Plt 224.     Treatment:   Imatinib, 400mg , 06/26/2015-05/17/2019  Bosutiib, 400 mg 10/04/2019 -     BCR/ABL testing:   06/2015: 87.086%  07/17/2015: 140.922%  08/18/2015: 75.693%  12/08/2015: 2.144%  01/07/2016: 2.450%  03/29/2016: 2.008%  07/26/2016: 2.871%; Kinase domain mutation testing negative  10/28/2016: 1.247%  01/05/2017: 0.641%  03/14/2017: 0.348%; Kinase domain mutation testing negative  06/25/19: Result not available   07/26/19: Result not available      07/26/19 FISH: positive t(9;22) in ~90.5% of cells     Final Diagnosis   Date Value Ref Range Status   08/20/2019   Final    Bone marrow, right iliac, aspiration and biopsy  -  Hypercellular bone marrow (95%) involved by chronic myeloid leukemia, BCR-ABL1-positive, chronic phase (2% blasts by manual aspirate differential)  -  See linked reports for associated Ancillary Studies.      This electronic signature is attestation that the pathologist personally reviewed the submitted material(s) and the final diagnosis reflects that evaluation.         08/12/19 BCR/ABL: 28.427 %  08/20/19 BCR/ABL: 35.897%  08/12/19 TKI Resistance Testing: Negative  10/04/19: started bosutinib 400 mg daily  07/03/20: BCR/ABL 68.2%, TKI resistance testing negative  11/11/20: BCR/ABL 74.4%, TKI resistance testing negative  12/09/20: started Asciminib 40 mg bid      Current treatment:   Asciminib 40 mg bid    05/27/21 BCR-ABL: 4.0%    10/2021: 1.1%  01/2022: 0.703%     Chronic myeloid leukemia (CMS-HCC)   06/26/2015 Initial Diagnosis    Chronic myeloid leukemia (CMS-HCC)     06/26/2015 - 05/17/2019 Chemotherapy    Imatinib, 400mg  daily; discontinued due to worsening edema  10/04/2019 - 11/2020 Chemotherapy    Bosutinib 400mg      11/11/2020 Progression    Loss of hematologic response with neutrophilia, monocytosis, eosinophilia but not basophilia. Possibly due to non-adherence and association of LE edema with bosutinib as potential toxicity--lack of molecular response but no BCR-ABL point mutations     12/10/2020 -  Chemotherapy    Asciminib 40 mg PO BID     01/04/2021 Remission    Complete hematologic response         Interval History:  Since last seen Colin Hunter states that he is feeling better.  He was recently admitted for perineal cellulitis and has completed all abx since discharge.  Today he presents with serum BG of >400.  He reports that he takes 40U of Lantus insulin at night and took it last night, but he is also supposed to take 26U of SSI with meals.  He did not take that this morning and he ate breakfast on the way to his appointment.  He denies any n/v/d.  He reports a good appetite.  Denies any sob/cough.      Otherwise, he denies new constitutional symptoms such as anorexia, weight loss, night sweats or unexplained fevers.  Furthermore, he denies symptoms of marrow failure: unexplained bleeding or bruising, recurrent or unexplained intercurrent infections, dyspnea on exertion, palpitations or chest pain.  There have been no new or unexplained pains or self-identified masses, swelling or enlarged lymph nodes.    Past Medical, Surgical and Family History were reviewed and pertinent updates were made in the Electronic Medical Record    Review of Systems:  Other than as reported above in the interim history, the balance of a full 12-system review was performed and unremarkable.    ECOG Performance Status: 0    Past Medical History:  Past Medical History:   Diagnosis Date    CKD (chronic kidney disease) stage 3, GFR 30-59 ml/min (CMS-HCC)     CML (chronic myelocytic leukemia) (CMS-HCC)     HFrEF (heart failure with reduced ejection fraction) (CMS-HCC)     Paroxysmal atrial fibrillation (CMS-HCC)     PFO (patent foramen ovale)     Right atrial thrombus     T2DM (type 2 diabetes mellitus) (CMS-HCC)        Medications:  Current Outpatient Medications   Medication Sig Dispense Refill    apixaban (ELIQUIS) 5 mg Tab Take 1 tablet (5 mg total) by mouth Two (2) times a day. 60 tablet 12    asciminib (SCEMBLIX) 40 mg tablet Take 1 tablet (40 mg total) by mouth Two (2) times a day. Take on an empty stomach, at least 1 hour before or 2 hours after a meal. Swallow tablets whole. Do not break, crush, or chew the tablets. 60 tablet 11    BINAXNOW COVID-19 AG SELF TEST Kit       blood pressure monitor Kit 1 each by Miscellaneous route in the morning. 1 kit 0    blood sugar diagnostic Strp       blood-glucose meter Misc       cholecalciferol, vitamin D3 25 mcg, 1,000 units,, 75 mcg (3,000 unit) Tab Take 1 tablet (75 mcg total) by mouth daily. 30 tablet 11    folic acid (FOLVITE) 1 MG tablet Take 1 tablet (1 mg total) by mouth in the morning. 90 tablet 3    insulin glargine (BASAGLAR, LANTUS) 100 unit/mL (3 mL) injection pen Inject 0.4 mL (40 Units total) under the skin two (  2) times a day.  0    insulin lispro (HUMALOG) 100 unit/mL injection pen INJECT 26 UNITS THREE TIMES DAILY BEFORE MEAL(S)      JARDIANCE 10 mg tablet Take 1 tablet (10 mg total) by mouth daily.      lancets Misc by Miscellaneous route.      methocarbamol (ROBAXIN) 500 MG tablet Take 1 tablet (500 mg total) by mouth in the morning.      NIFEdipine (PROCARDIA XL) 30 MG 24 hr tablet Take 1 tablet (30 mg total) by mouth daily. 30 tablet 0    ONETOUCH VERIO TEST STRIPS Strp USE 1 STRIP TO CHECK GLUCOSE TWICE DAILY      OZEMPIC 0.25 mg or 0.5 mg(2 mg/1.5 mL) PnIj injection Inject 0.5 mg under the skin every seven (7) days.      pantoprazole (PROTONIX) 40 MG tablet Take 1 tablet (40 mg total) by mouth daily.      pregabalin (LYRICA) 150 MG capsule Take 1 capsule (150 mg total) by mouth Three (3) times a day.      rosuvastatin (CRESTOR) 20 MG tablet Take 1 tablet (20 mg total) by mouth daily. 90 tablet 0    VENTOLIN HFA 90 mcg/actuation inhaler Inhale 2 puffs every six (6) hours as needed. 18 g 0     No current facility-administered medications for this visit.       Vital Signs:  BSA: There is no height or weight on file to calculate BSA.  There were no vitals filed for this visit.        Physical Exam:  General:  resting, in no apparent distress  HEENT:  Pupils are equal, round and reactive to light and accomodation.  There is no scleral icterus and no conjunctival injection.      Heart:  RRR, Systolic ejection murmur.  S1, S2, no R/G. Lower extremities without pitting edema.  Lungs:  Breathing is unlabored and patient is speaking full sentences with ease. CTAB without rales, ronchi or crackles.    Abdomen:  No distention or pain on palpation.  Bowel sounds are present.  No palpable masses.  Skin:  No rashes, petechiae or purpura.  Grossly intact.   Musculoskeletal:  Range of motion about the shoulder, elbow, hips and knees is grossly normal.  Strength is equal bilaterally.  Neurologic:  Alert and oriented x 4.  Stable gait    Relevant Laboratory, radiology and pathology results:  No visits with results within 1 Day(s) from this visit.   Latest known visit with results is:   Lab on 06/30/2022   Component Date Value Ref Range Status    Vitamin D Total (25OH) 06/30/2022 7.8 (L)  20.0 - 80.0 ng/mL Final    PTH 06/30/2022 194.6 (H)  18.4 - 80.1 pg/mL Final    Sodium 06/30/2022 136  135 - 145 mmol/L Final    Potassium 06/30/2022 4.5  3.4 - 4.8 mmol/L Final    Chloride 06/30/2022 110 (H)  98 - 107 mmol/L Final    CO2 06/30/2022 22.0  20.0 - 31.0 mmol/L Final    Anion Gap 06/30/2022 4 (L)  5 - 14 mmol/L Final    BUN 06/30/2022 27 (H)  9 - 23 mg/dL Final    Creatinine 16/01/9603 2.31 (H)  0.73 - 1.18 mg/dL Final    BUN/Creatinine Ratio 06/30/2022 12   Final    eGFR CKD-EPI (2021) Male 06/30/2022 33 (L)  >=60 mL/min/1.21m2 Final    eGFR calculated with CKD-EPI 2021  equation in accordance with [employer] and [employer] of Nephrology Task Force recommendations.    Glucose 06/30/2022 258 (H)  70 - 99 mg/dL Final    Calcium 10/09/1599 9.1  8.7 - 10.4 mg/dL Final    Phosphorus 09/32/3557 4.0  2.4 - 5.1 mg/dL Final    Albumin 32/20/2542 3.1 (L)  3.4 - 5.0 g/dL Final

## 2022-12-05 ENCOUNTER — Ambulatory Visit
Admit: 2022-12-05 | Payer: MEDICARE | Attending: Student in an Organized Health Care Education/Training Program | Primary: Student in an Organized Health Care Education/Training Program

## 2022-12-05 ENCOUNTER — Ambulatory Visit: Admit: 2022-12-05 | Payer: MEDICARE

## 2022-12-09 NOTE — Unmapped (Signed)
Patient Colin Hunter was called in attempt number 1 to reach them regarding rescheduling their missed appointment from 12/05/22.    The call resulted in: Number not working    Please reschedule the patient upon their return call on next opening after 9/23 for the following:     Lab Appointment and APP    Thank you,    Carly R Sheffield Slider

## 2023-02-09 NOTE — Unmapped (Signed)
Called patient's daughter re lack of follow-up after patient's TPVR. Daughter states she will call back tomorrow to scheduled when she visits the patient.

## 2023-02-14 DIAGNOSIS — I371 Nonrheumatic pulmonary valve insufficiency: Principal | ICD-10-CM

## 2023-02-14 NOTE — Unmapped (Signed)
Received patient call to schedule follow up appointment. Patient is limited on days that he can make it to [facility] as his car in under repair and his insurance company will not provide transportation to [facility]. His next available is Friday 11/22. Will OVB with Simmie Davies, MD. Echo prior to appointment. Date and time verified. All questions answered. All concerns addressed.

## 2023-03-02 NOTE — Unmapped (Signed)
Specialty Medication(s): Scemblix    Colin Hunter has been dis-enrolled from the [facility] specialty pharmacy services due to multiple unsuccessful outreach attempts by the pharmacy.    Additional information provided to the patient: NA    Kermit Balo, [facility]  [facility] Specialty Pharmacist

## 2023-03-03 ENCOUNTER — Ambulatory Visit: Admit: 2023-03-03 | Discharge: 2023-03-04 | Payer: MEDICARE

## 2023-03-03 DIAGNOSIS — I480 Paroxysmal atrial fibrillation: Principal | ICD-10-CM

## 2023-03-03 MED ORDER — FUROSEMIDE 20 MG TABLET
ORAL_TABLET | Freq: Two times a day (BID) | ORAL | 11 refills | 30 days | Status: CP
Start: 2023-03-03 — End: 2024-03-02

## 2023-05-07 DIAGNOSIS — C9210 Chronic myeloid leukemia, BCR/ABL-positive, not having achieved remission: Principal | ICD-10-CM

## 2023-05-11 ENCOUNTER — Ambulatory Visit
Admit: 2023-05-11 | Payer: MEDICARE | Attending: Student in an Organized Health Care Education/Training Program | Primary: Student in an Organized Health Care Education/Training Program

## 2023-09-17 DIAGNOSIS — C9210 Chronic myeloid leukemia, BCR/ABL-positive, not having achieved remission: Principal | ICD-10-CM

## 2023-11-05 IMAGING — DX XR CHEST 1 VIEW
1 series · 2 of 2 positions shown · non-contrast
Comparison: None provided.

FINAL REPORT:
EXAM:
CR Chest, 2  View.
CLINICAL HISTORY: Weakness.

[Series 5010: AP · U · 0.10mm/px · 2 of 2 slices shown]
[im 1/2]
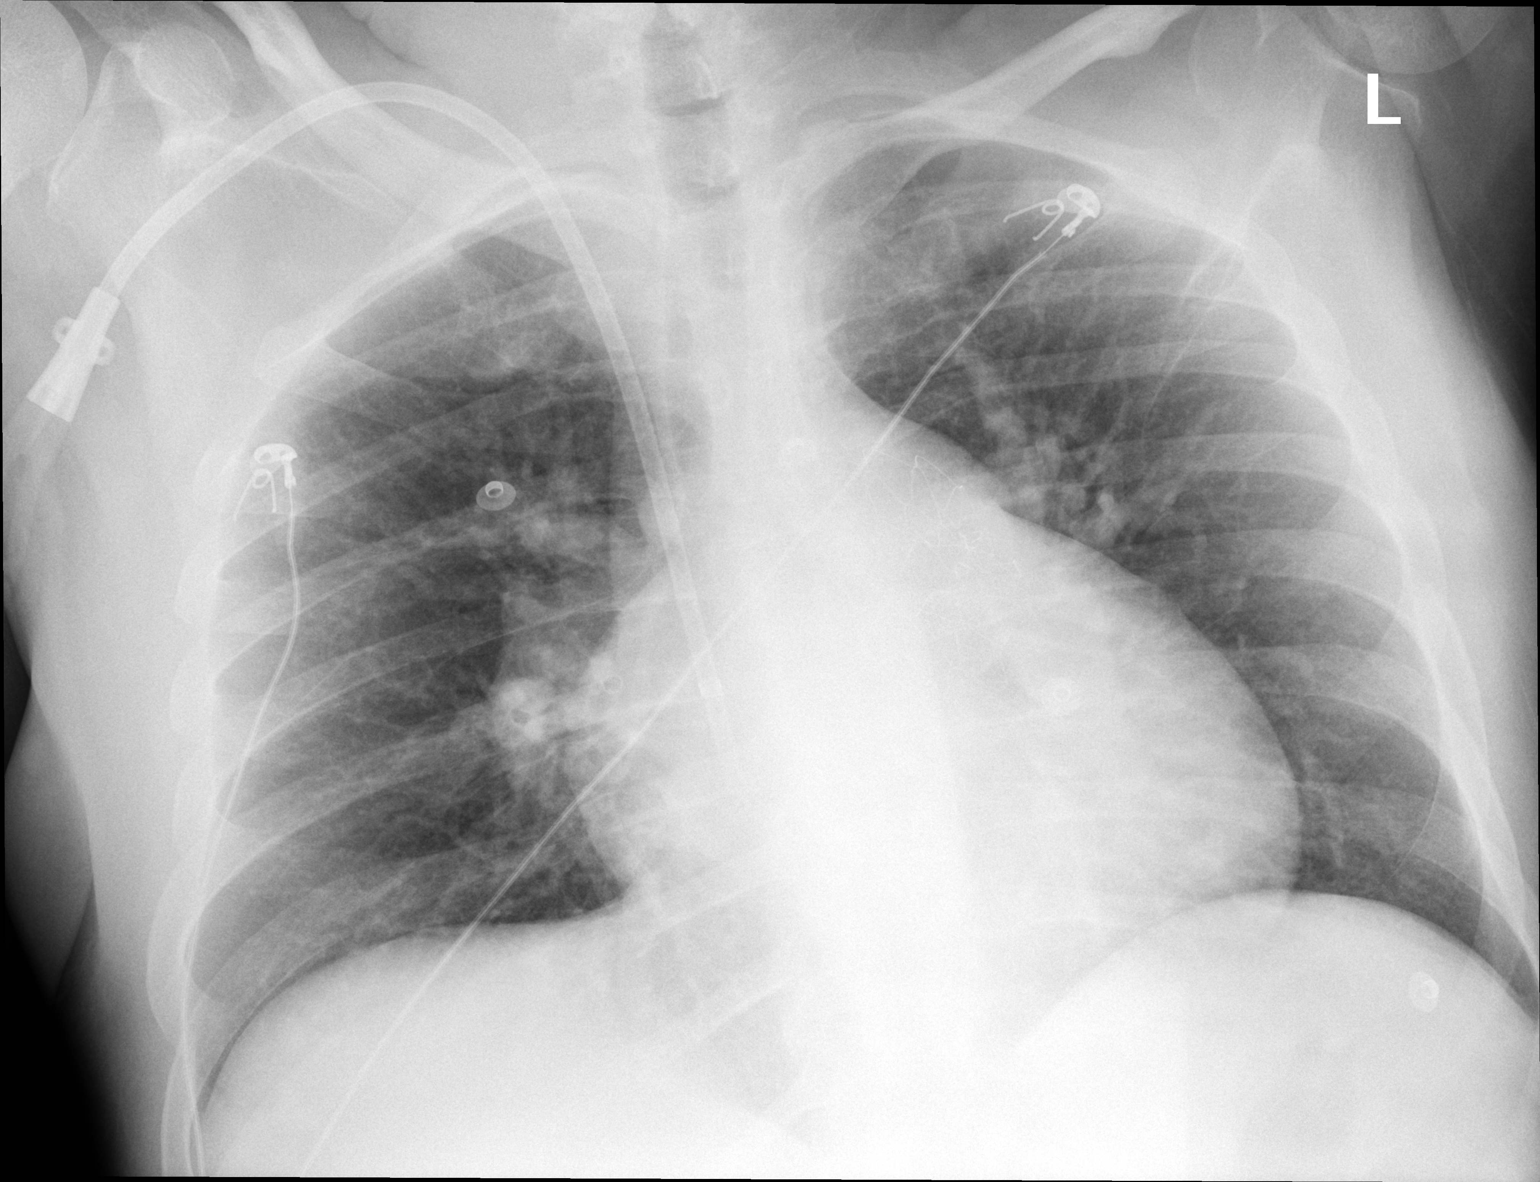
[im 2/2]
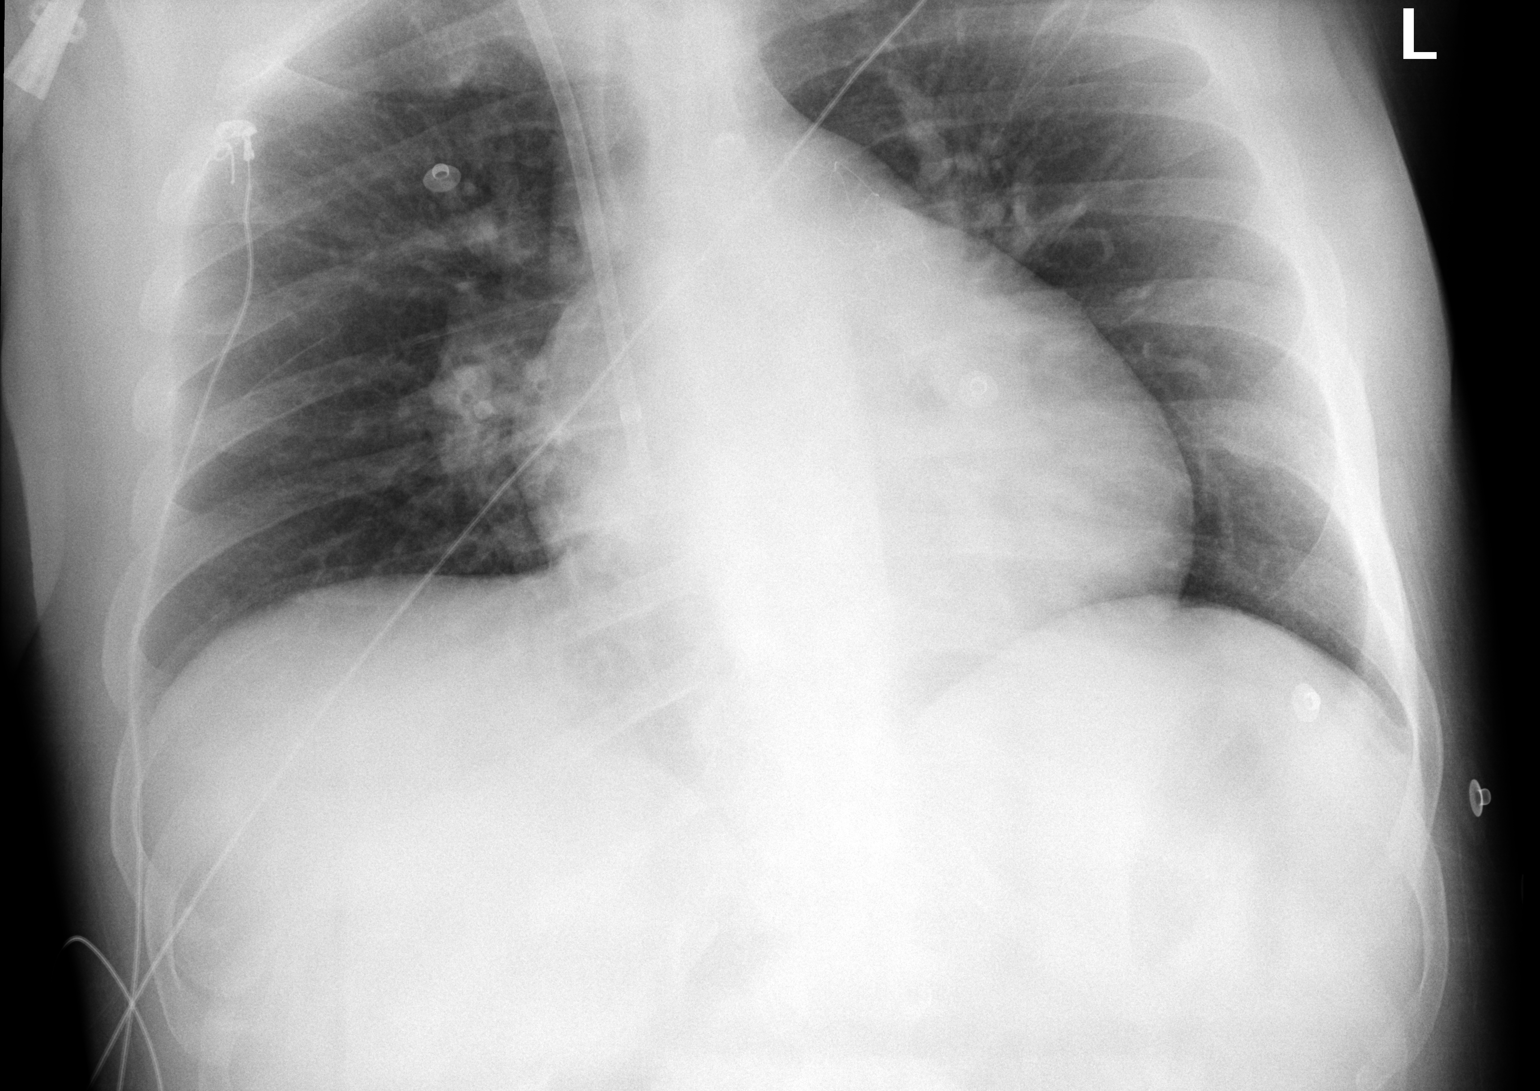

[2 of 2 positions shown; findings below may reference images not displayed]

FINDINGS: LUNGS:
There is no mass, infiltrate, or acute pulmonary abnormality.
PLEURAL SPACES:
No evidence of pleural effusion or pneumothorax.
MEDIASTINUM:
The heart is enlarged. There is left atrial occlusive device. Right IJ CVC projects over the right atrium. No significant pulmonary vasculature congestion.
BONES:
No acute osseous abnormality.
IMPRESSION: No acute cardiopulmonary pathology is evident.

## 2023-11-05 IMAGING — CT CT HEAD WITHOUT CONTRAST
3 of 4 series · 16 of 47 positions shown, 19 images · non-contrast
Comparison: None available

FINAL REPORT:
INDICATION: Mental status change, unknown cause Pain
Exam: CT of the head without IV Contrast
Submitted images:
All CT scans at this facility use iterative reconstruction technique, dose modulation and/or weight based dosing when appropriate to reduce radiation dose to as low as reasonably achievable.

[Series 4: thins · axial · 0.49mm/px · z∈[-14,+121]mm · 10 of 256 slices shown, 13 images]
[im 20/256  brain]
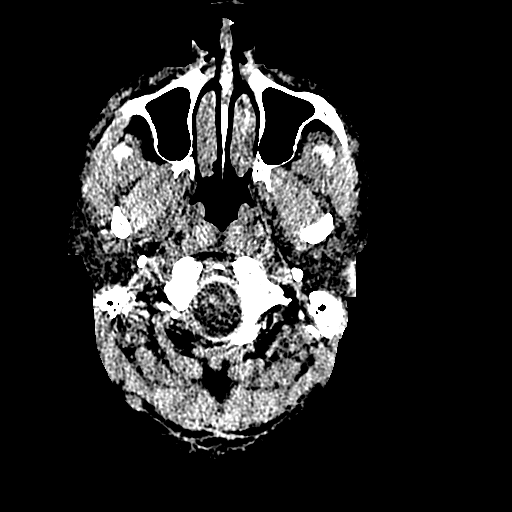
[im 20/256  bone]
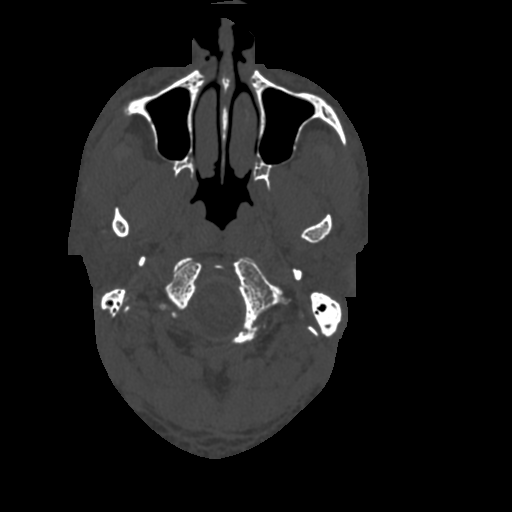
[im 40/256  brain]
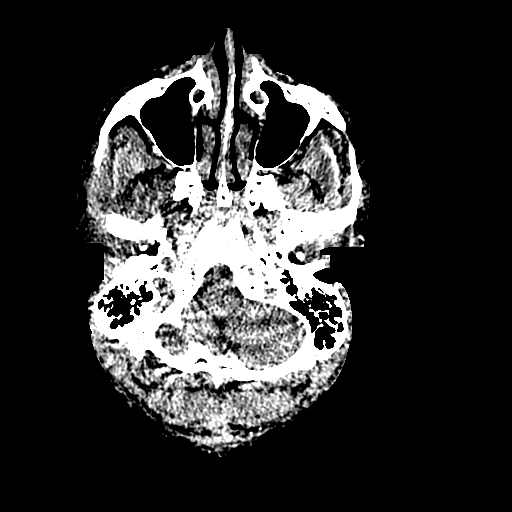
[im 69/256  brain]
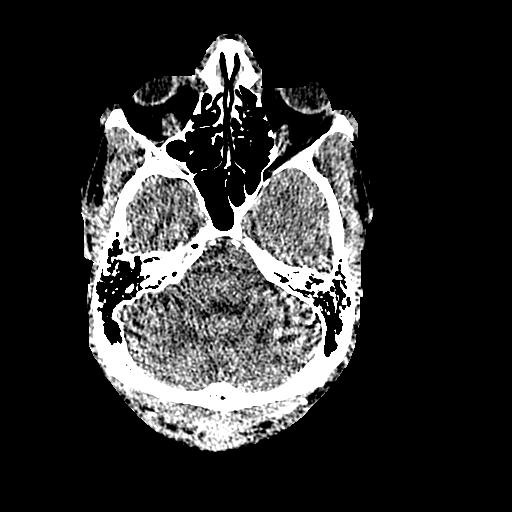
[im 89/256  brain]
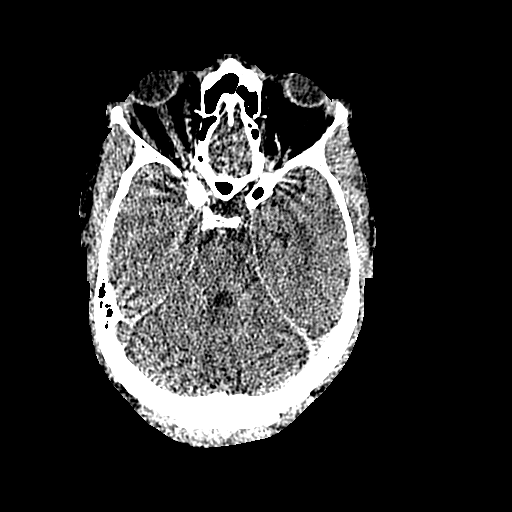
[im 118/256  brain]
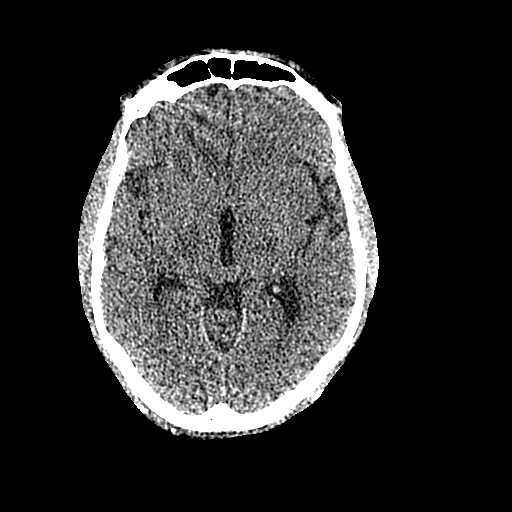
[im 118/256  bone]
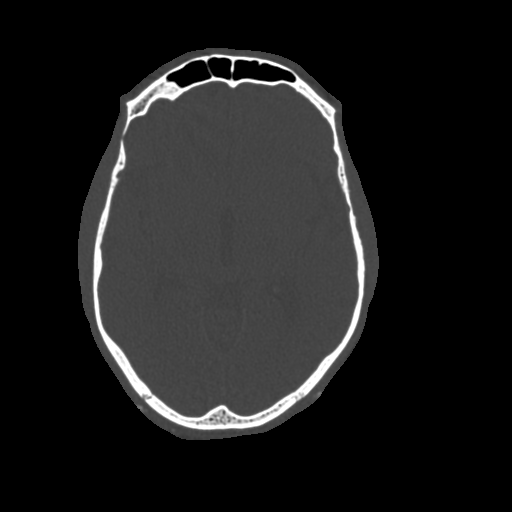
[im 138/256  brain]
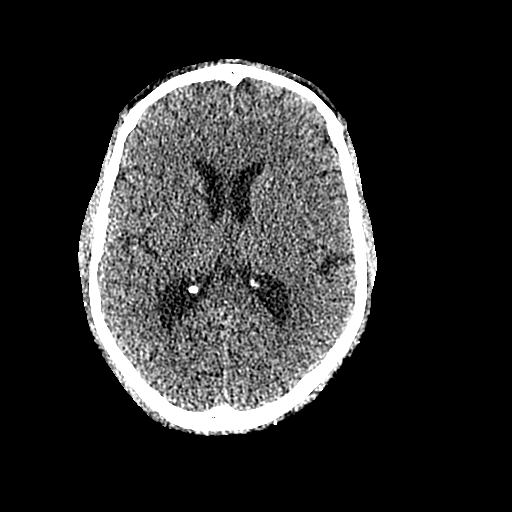
[im 167/256  brain]
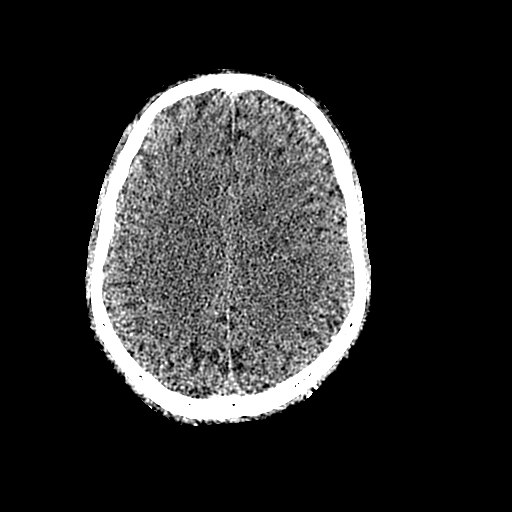
[im 187/256  brain]
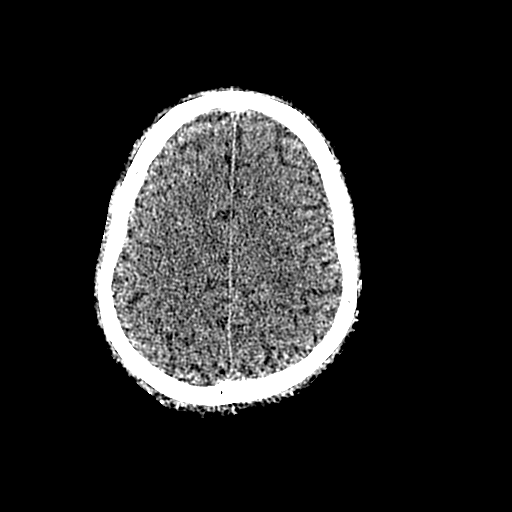
[im 216/256  brain]
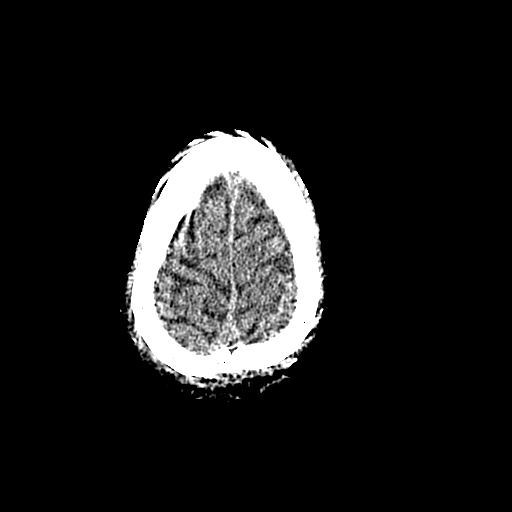
[im 216/256  bone]
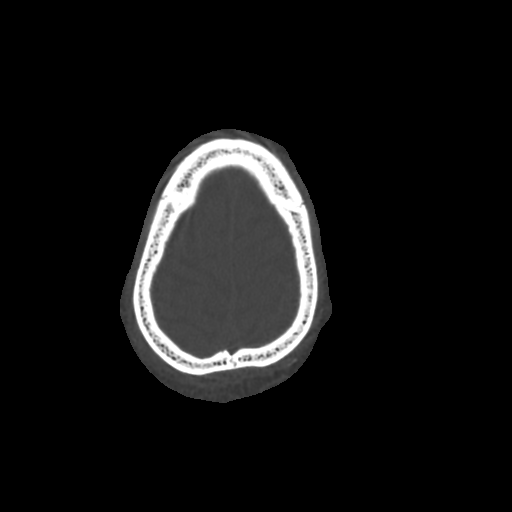
[im 236/256  brain]
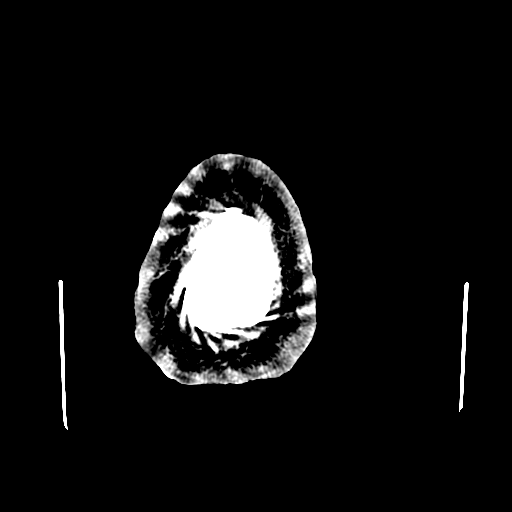

[Series 601: cor head · coronal · 0.49mm/px · 3 of 114 slices shown]
[im 38/114  brain]
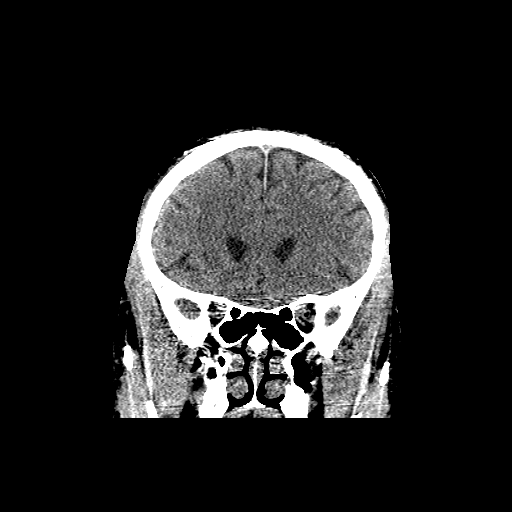
[im 51/114  brain]
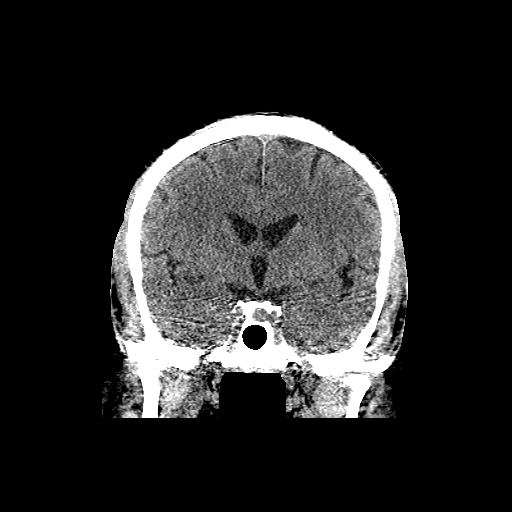
[im 63/114  brain]
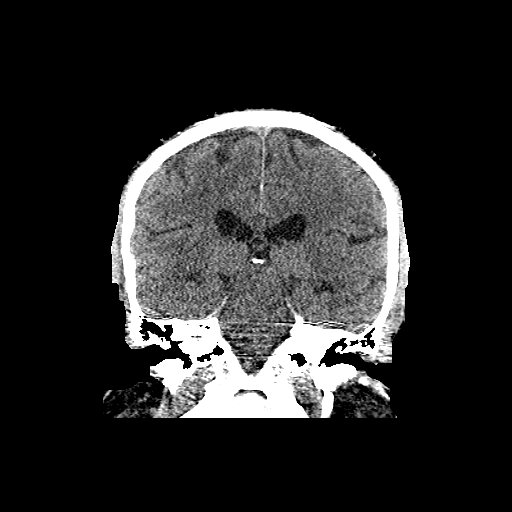

[Series 602: sag head · sagittal · 0.49mm/px · 3 of 96 slices shown]
[im 32/96  brain]
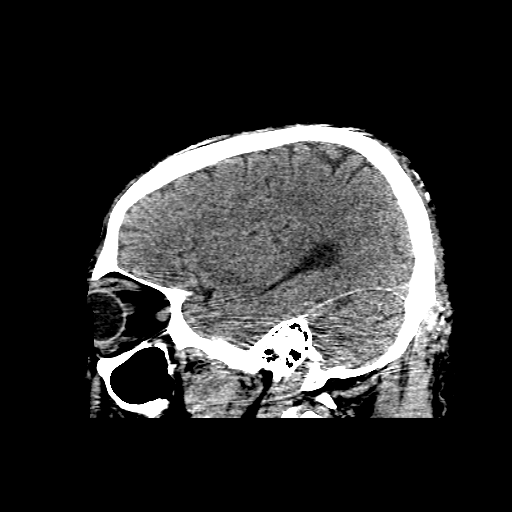
[im 48/96  brain]
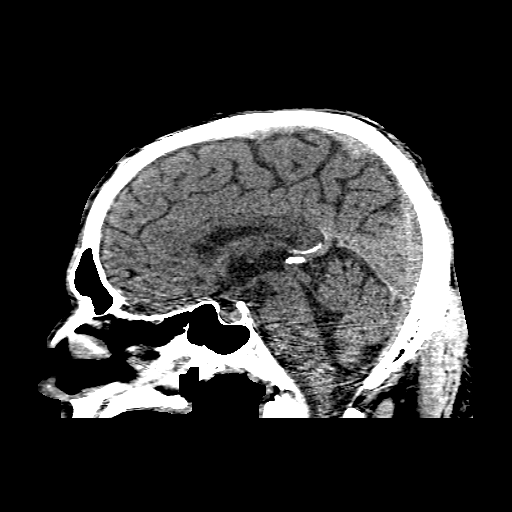
[im 64/96  brain]
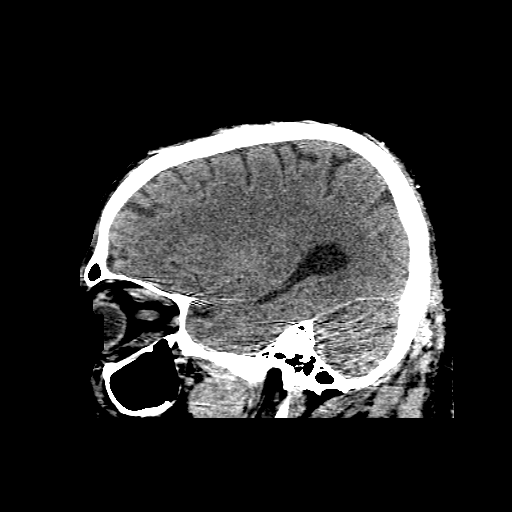

[16 of 47 positions shown; findings below may reference images not displayed]

FINDINGS: The brain parenchyma demonstrates no evidence of territorial infarct nor mass lesion. There is no intra-axial or extra-axial blood. The ventricles are normal in size and shape. Surrounding soft tissues are grossly unremarkable. Bone window evaluation demonstrates no acute process.
IMPRESSION: No acute intracranial process.

## 2023-11-06 IMAGING — MR MRI BRAIN WITHOUT CONTRAST
9 of 11 series · 33 of 48 positions shown · non-contrast
Comparison: 11/05/2023.

Vision loss
FINAL REPORT:
EXAM: MRI of the brain without contrast.
INDICATION: Left eye vision loss. . .
TECHNIQUE: MRI of the brain was performed using sagittal T1, axial FLAIR, axial T2, axial T1, axial gradient echo, axial diffusion, and ADC map sequences.

[Series 2001: survey_mpr_sag · sagittal · 1.6mm · 1.60mm/px · 1 of 5 slices shown]
[im 1/5]
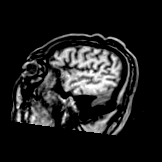

[Series 3001: survey_mpr_cor · coronal · 1.6mm · 1.60mm/px · 1 of 3 slices shown]
[im 1/3]
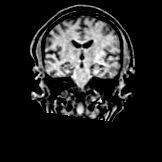

[Series 4001: survey_mpr_tra · axial · 1.6mm · 1.60mm/px · 1 of 3 slices shown]
[im 1/3]
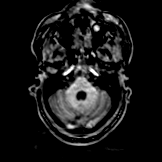

[Series 5001: T1 · sagittal · 5.0mm · 0.40mm/px · 4 of 27 slices shown (1 of 2)]
[im 1/27]
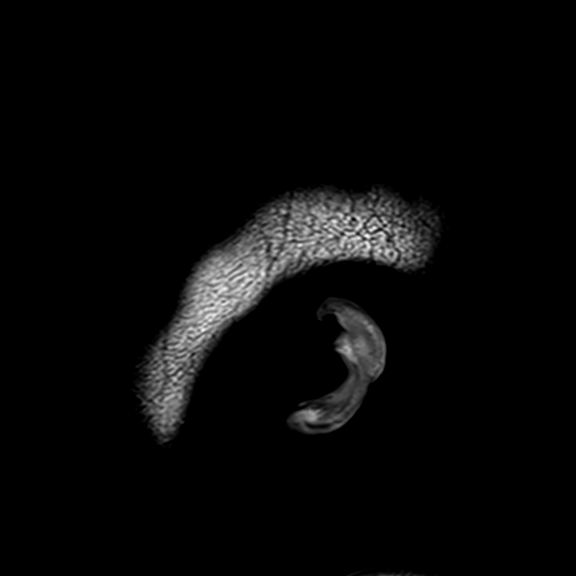
[im 9/27]
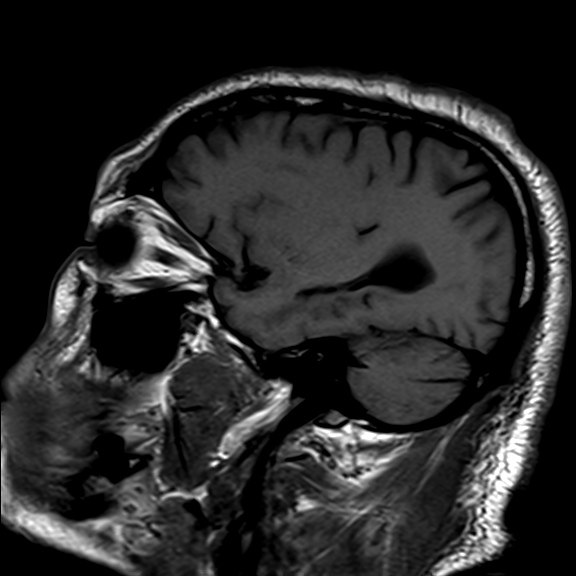
[im 18/27]
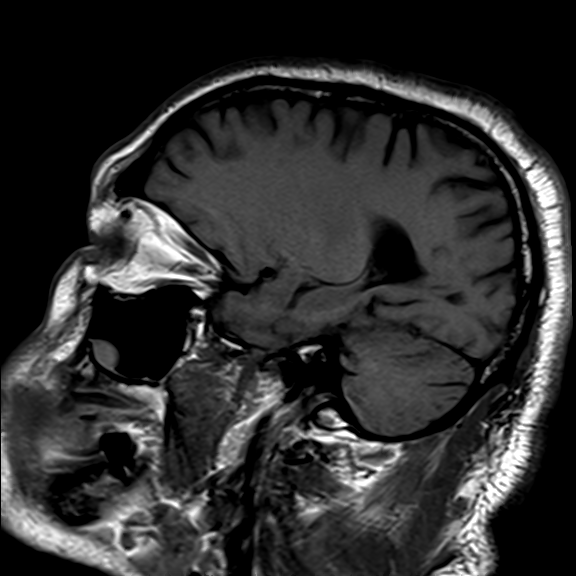
[im 27/27]
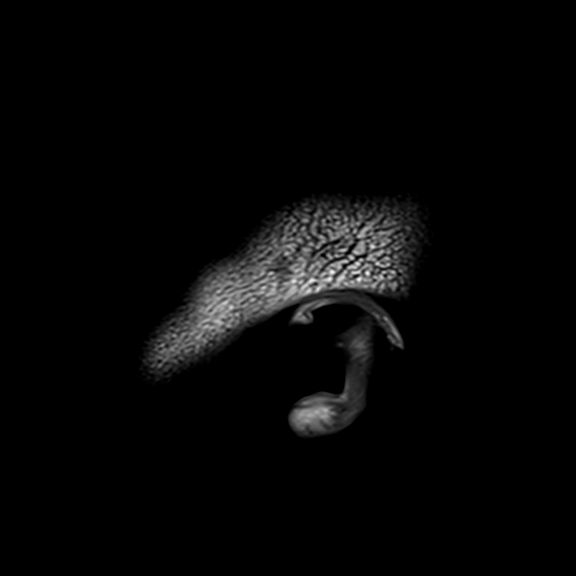

[Series 6002: ax dwi_tracew · axial · 5.0mm · 0.60mm/px · z∈[-148,-107]mm · 2 of 30 slices shown]
[im 1/30]
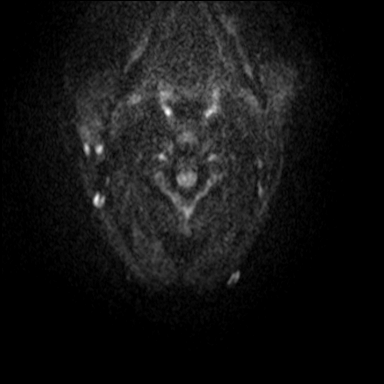
[im 8/30]
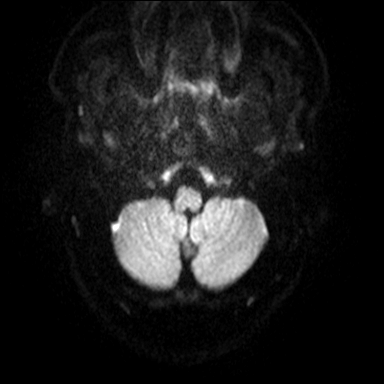

[Series 9001: T2 · axial · 5.0mm · 0.51mm/px · z∈[-146,+26]mm · 6 of 30 slices shown]
[im 1/30]
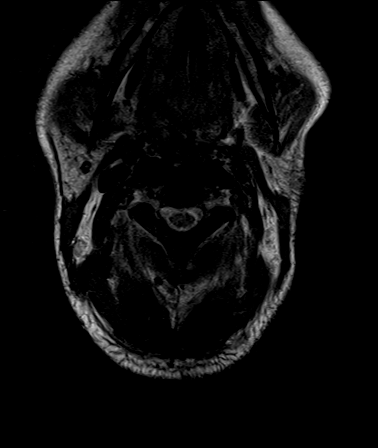
[im 6/30]
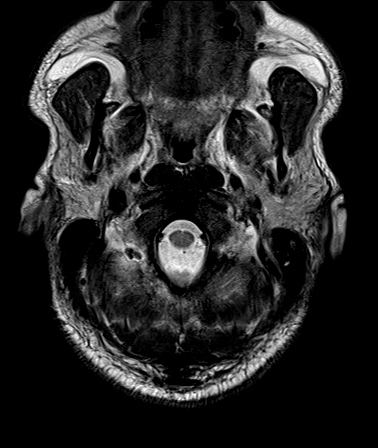
[im 12/30]
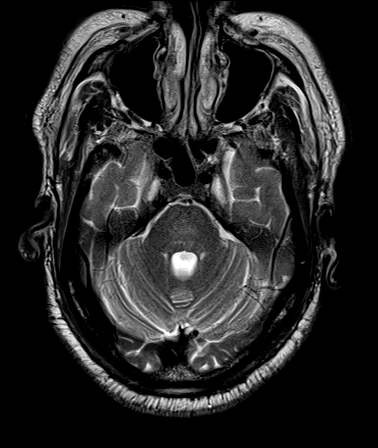
[im 18/30]
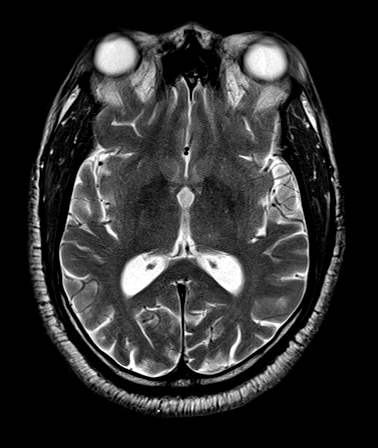
[im 24/30]
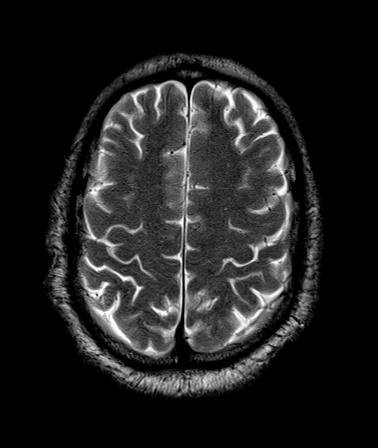
[im 30/30]
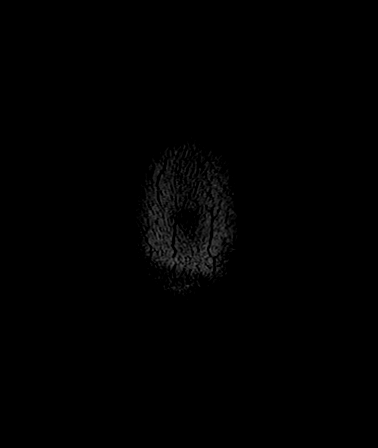

[T1 · axial · 5.0mm · 0.72mm/px · z∈[-146,+26]mm · 6 of 30 slices shown (2 of 2)]
[im 1/30]
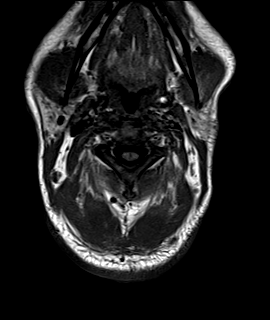
[im 6/30]
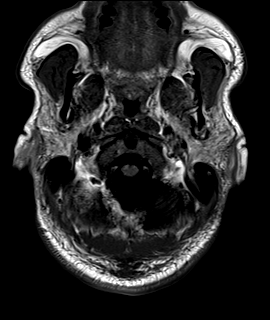
[im 12/30]
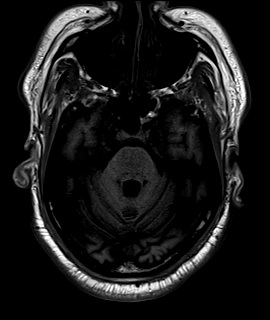
[im 18/30]
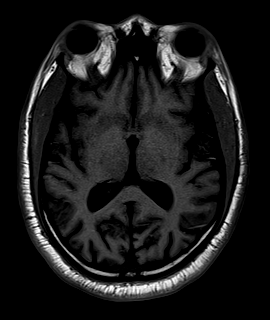
[im 24/30]
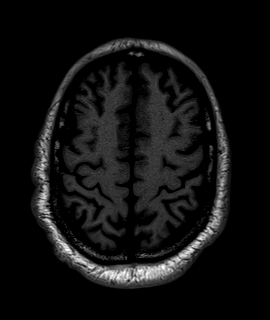
[im 30/30]
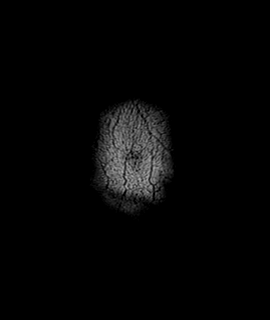

[FLAIR · axial · 5.0mm · 0.72mm/px · z∈[-146,+26]mm · 6 of 30 slices shown]
[im 1/30]
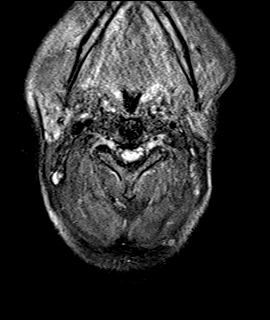
[im 6/30]
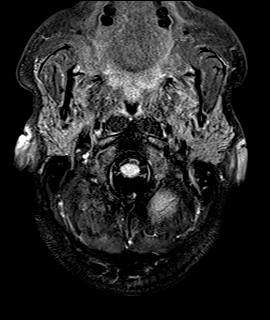
[im 12/30]
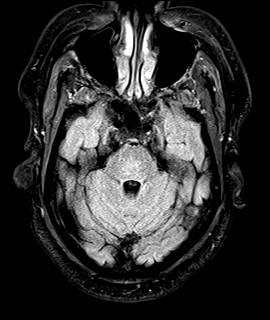
[im 18/30]
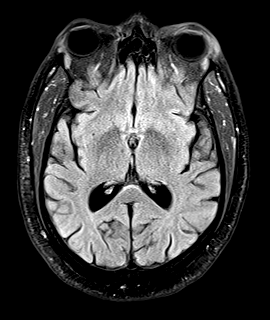
[im 24/30]
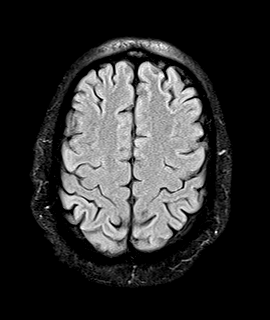
[im 30/30]
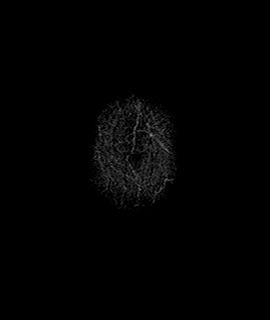

[GRE · axial · 5.0mm · 0.45mm/px · z∈[-146,+26]mm · 6 of 30 slices shown]
[im 1/30]
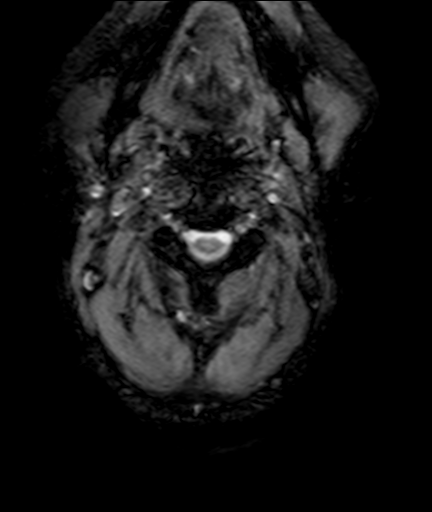
[im 6/30]
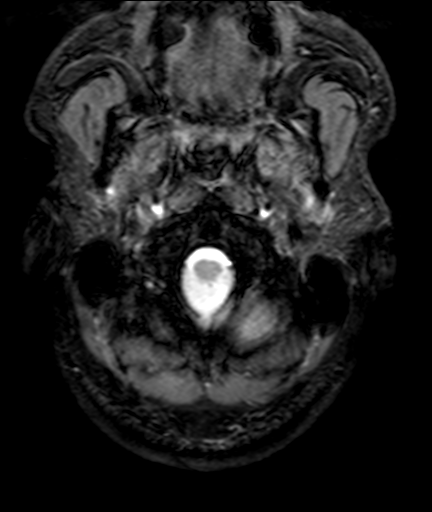
[im 12/30]
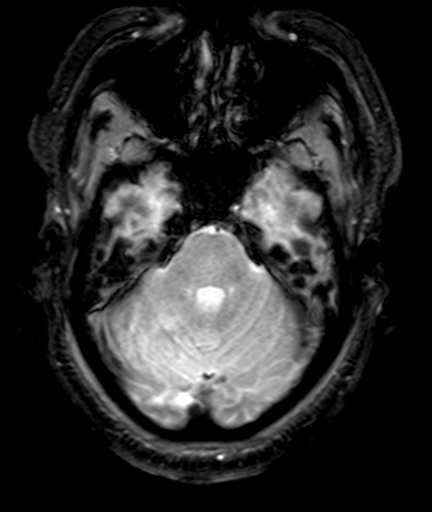
[im 18/30]
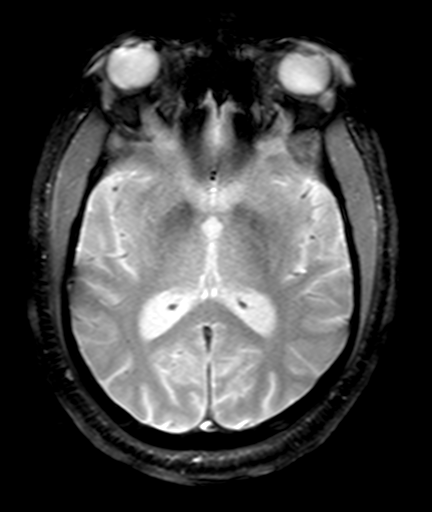
[im 24/30]
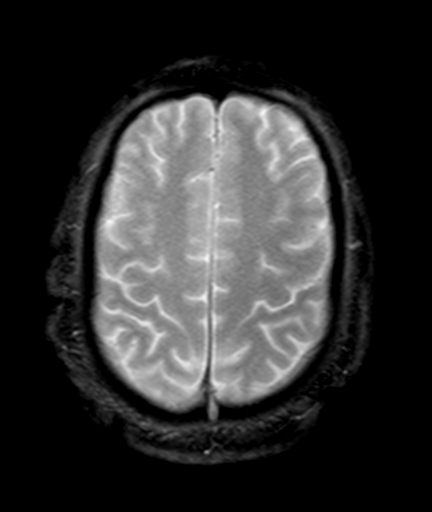
[im 30/30]
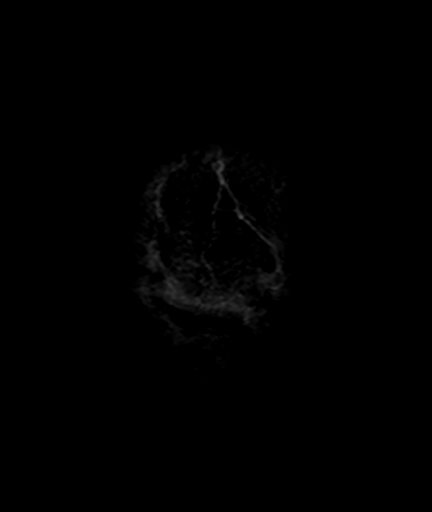

[33 of 48 positions shown; findings below may reference images not displayed]

FINDINGS: No midline shift. Basal cisterns are patent.  Ventricles are normal and symmetric.
Minimal periventricular and subcortical white matter hypoattenuation is demonstrated, consistent with a nonspecific leukoencephalopathy, though likely correlating with chronic microangiopathy.
No evidence for acute infarct, intracranial hemorrhage or mass lesion. No extra-axial fluid collections.
Intracranial vascular flow voids are normal.
Orbital contents are unremarkable.
Sinuses and mastoids are clear.
IMPRESSION: No acute intracranial abnormalities.

## 2023-11-06 IMAGING — MR MRI PELVIS WITH AND WITHOUT CONTRAST
10 of 11 series · 43 of 48 positions shown · non-contrast
Comparison: None.

osteomylitis
FINAL REPORT:
MRI PELVIS WITH AND WITHOUT CONTRAST
INDICATION: Sacral osteomyelitis.
TECHNIQUE: Multiplanar multisequence MR imaging of the pelvis was performed with and without intravenous contrast.

[Series 2001: survey · axial · 8.0mm · 0.78mm/px · z∈[-39,+232]mm · 3 of 22 slices shown]
[im 1/22]
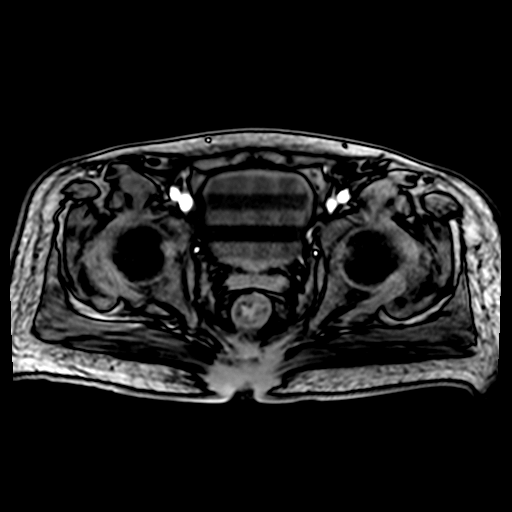
[im 11/22]
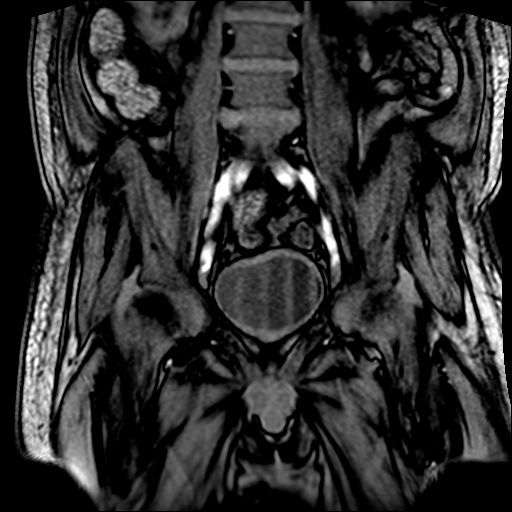
[im 22/22]
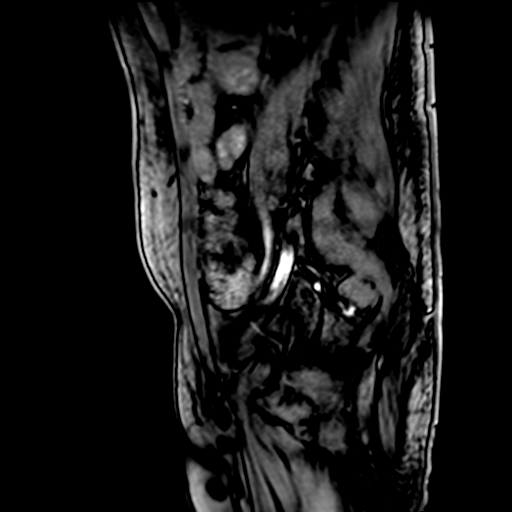

[Series 3001: T1 · coronal · 5.0mm · 0.85mm/px · 5 of 42 slices shown (1 of 3)]
[im 1/42]
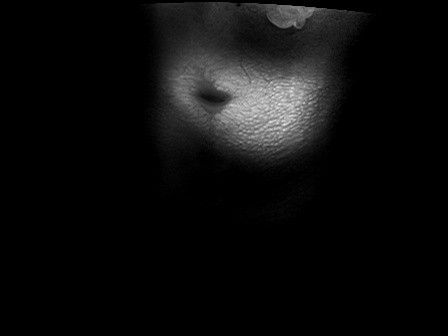
[im 11/42]
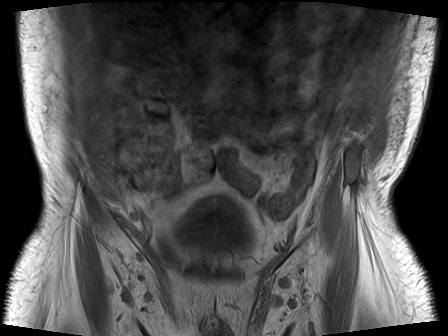
[im 21/42]
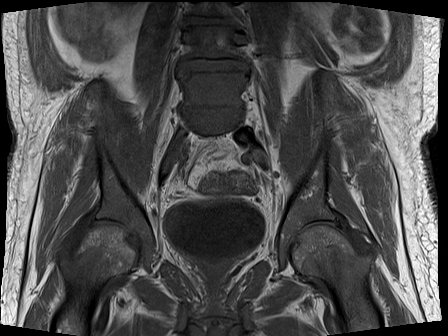
[im 31/42]
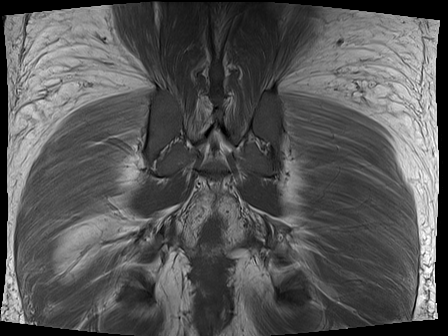
[im 42/42]
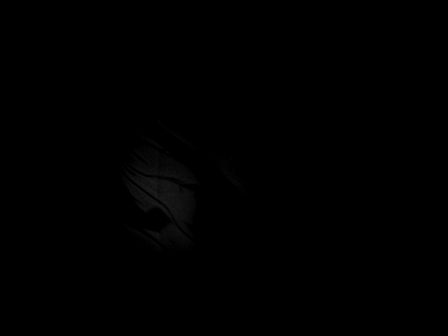

[Series 4001: STIR · coronal · 5.0mm · 1.19mm/px · 5 of 42 slices shown (1 of 3)]
[im 1/42]
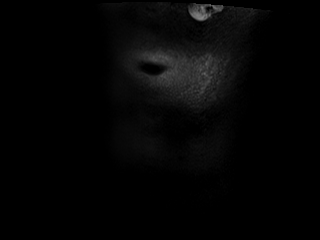
[im 11/42]
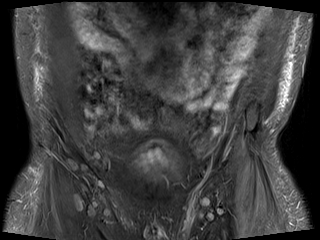
[im 21/42]
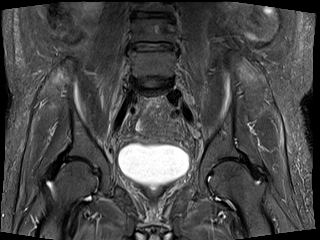
[im 31/42]
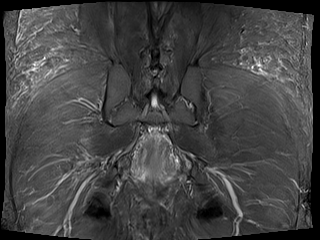
[im 42/42]
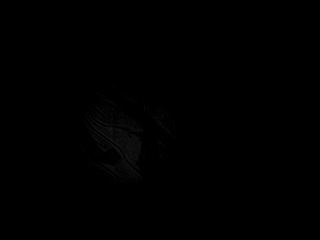

[Series 5001: T1 · axial · 5.0mm · 0.89mm/px · z∈[-121,+137]mm · 4 of 44 slices shown (2 of 3)]
[im 1/44]
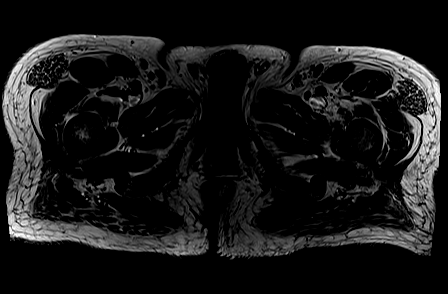
[im 15/44]
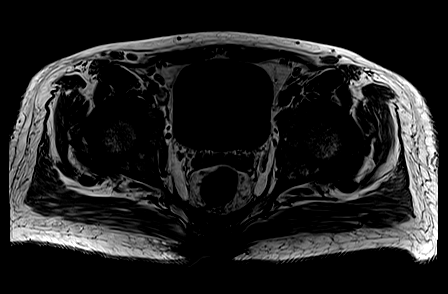
[im 29/44]
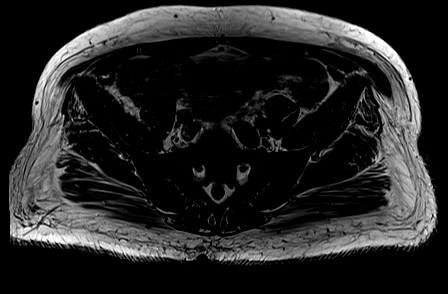
[im 44/44]
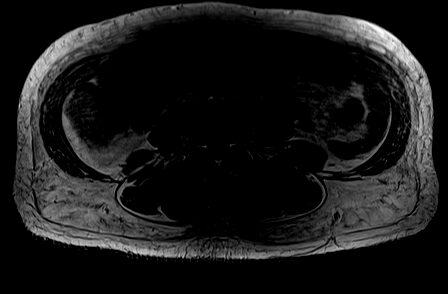

[Series 6001: STIR · axial · 5.0mm · 1.25mm/px · z∈[-121,+137]mm · 4 of 44 slices shown (2 of 3)]
[im 1/44]
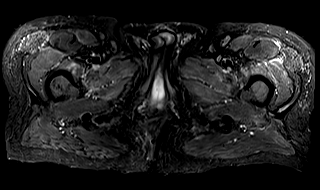
[im 15/44]
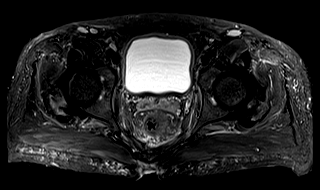
[im 29/44]
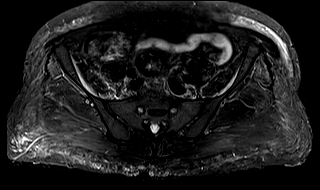
[im 44/44]
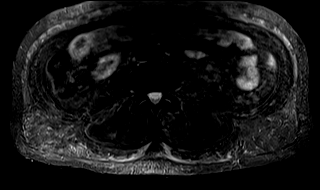

[Series 7001: T2 fat-sat · axial · 5.0mm · 1.25mm/px · z∈[-121,+137]mm · 4 of 44 slices shown]
[im 1/44]
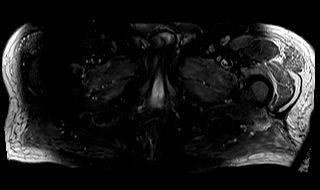
[im 15/44]
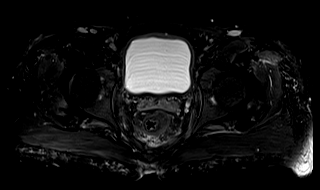
[im 29/44]
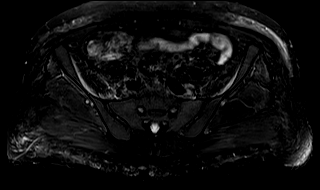
[im 44/44]
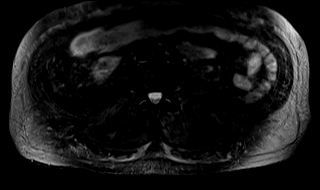

[Series 8001: T1 · sagittal · 6.0mm · 0.78mm/px · 5 of 50 slices shown (3 of 3)]
[im 1/50]
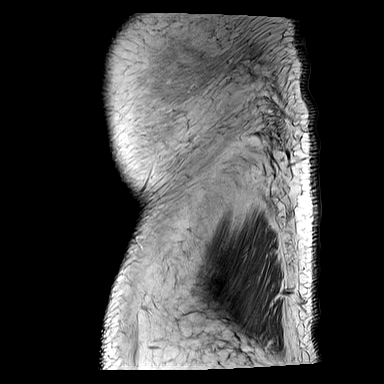
[im 13/50]
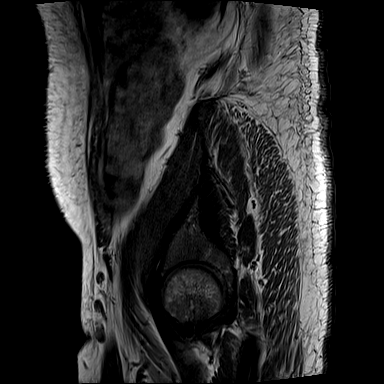
[im 25/50]
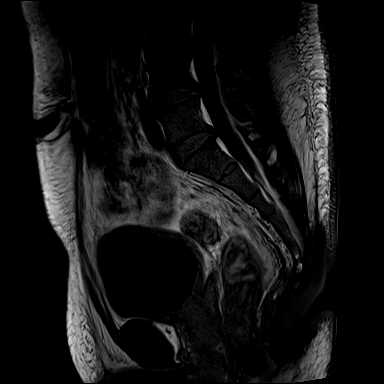
[im 37/50]
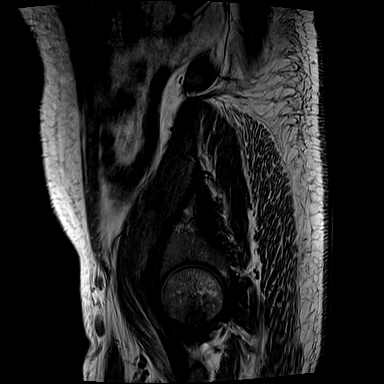
[im 50/50]
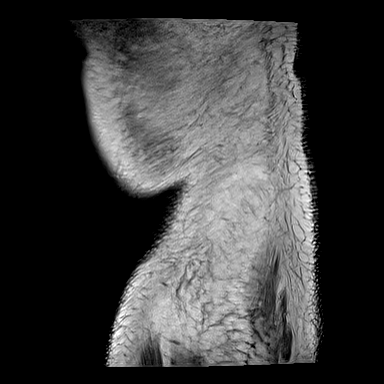

[Series 9001: STIR · sagittal · 6.0mm · 0.94mm/px · 5 of 50 slices shown (3 of 3)]
[im 1/50]
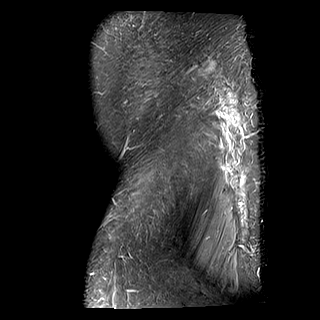
[im 13/50]
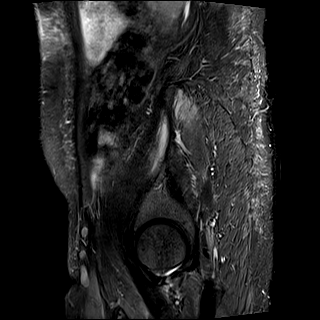
[im 25/50]
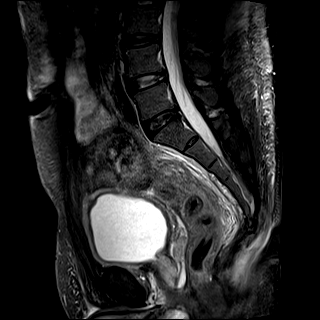
[im 37/50]
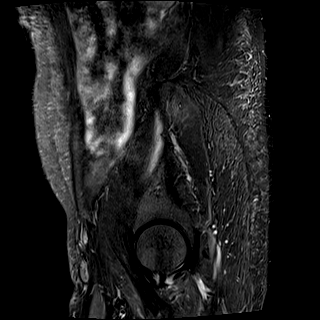
[im 50/50]
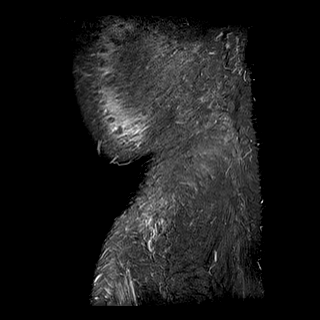

[T1 fat-sat post-contrast · coronal · 5.0mm · 1.19mm/px · 4 of 42 slices shown (1 of 2)]
[im 1/42]
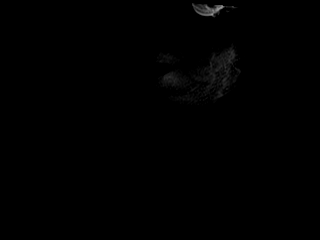
[im 14/42]
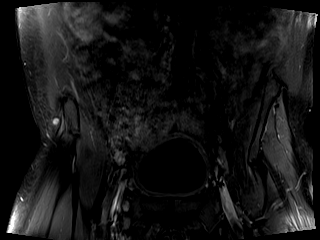
[im 28/42]
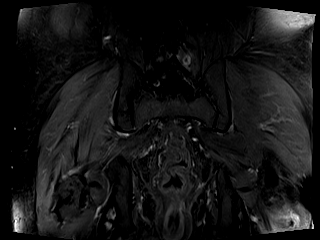
[im 42/42]
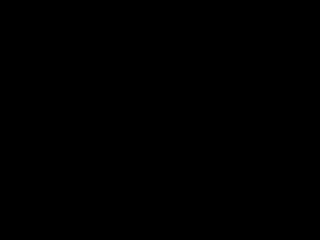

[T1 fat-sat post-contrast · sagittal · 6.0mm · 0.94mm/px · 4 of 50 slices shown (2 of 2)]
[im 1/50]
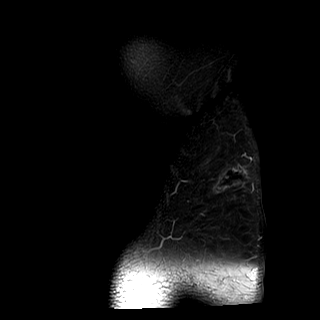
[im 13/50]
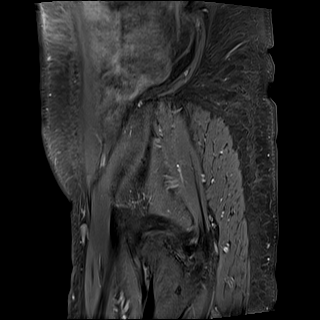
[im 25/50]
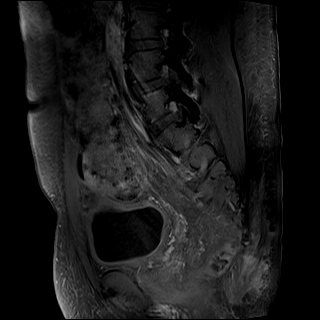
[im 37/50]
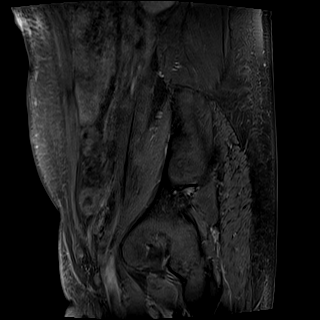

[43 of 48 positions shown; findings below may reference images not displayed]

FINDINGS: There is a midline decubitus ulcer overlying the coccyx with associated soft tissue edema and enhancement. No defined, drainable abscess. There is abnormal bone marrow signal in the coccyx subjacent to the ulcer.
No evidence of a recent stress or traumatic fracture. Lower lumbar spondylosis. The pubic symphysis and sacroiliac joints are unremarkable. There is bilateral gluteal tendinosis. Tendinosis of the hamstring tendons with low-grade intrasubstance tearing, right more prominent than left. Bilateral gluteal intramuscular edema, more pronounced in the right gluteus medius muscle. No acute intrapelvic findings. No suspicious lymphadenopathy.
IMPRESSION: 
IMPRESSION: 1. Midline decubitus ulcer with subjacent osteomyelitis in the coccyx.
2. No drainable abscess.
